# Patient Record
Sex: Female | Born: 1980
Health system: Southern US, Community
[De-identification: ages and names within clinical notes are randomized; demographics above are authoritative.]

## PROBLEM LIST (undated history)

## (undated) DIAGNOSIS — N809 Endometriosis, unspecified: Secondary | ICD-10-CM

## (undated) DIAGNOSIS — I509 Heart failure, unspecified: Secondary | ICD-10-CM

## (undated) DIAGNOSIS — R600 Localized edema: Secondary | ICD-10-CM

## (undated) DIAGNOSIS — R011 Cardiac murmur, unspecified: Secondary | ICD-10-CM

## (undated) DIAGNOSIS — N879 Dysplasia of cervix uteri, unspecified: Secondary | ICD-10-CM

## (undated) DIAGNOSIS — M549 Dorsalgia, unspecified: Secondary | ICD-10-CM

## (undated) DIAGNOSIS — D649 Anemia, unspecified: Secondary | ICD-10-CM

## (undated) DIAGNOSIS — R51 Headache: Secondary | ICD-10-CM

## (undated) DIAGNOSIS — R85619 Unspecified abnormal cytological findings in specimens from anus: Secondary | ICD-10-CM

## (undated) DIAGNOSIS — K829 Disease of gallbladder, unspecified: Secondary | ICD-10-CM

## (undated) DIAGNOSIS — R519 Headache, unspecified: Secondary | ICD-10-CM

## (undated) HISTORY — DX: Headache, unspecified: R51.9

## (undated) HISTORY — DX: Anemia, unspecified: D64.9

## (undated) HISTORY — DX: Headache: R51

## (undated) HISTORY — DX: Dorsalgia, unspecified: M54.9

## (undated) HISTORY — DX: Unspecified abnormal cytological findings in specimens from anus: R85.619

## (undated) HISTORY — DX: Dysplasia of cervix uteri, unspecified: N87.9

## (undated) HISTORY — DX: Cardiac murmur, unspecified: R01.1

## (undated) HISTORY — DX: Disease of gallbladder, unspecified: K82.9

## (undated) HISTORY — DX: Endometriosis, unspecified: N80.9

## (undated) HISTORY — PX: CORONARY ARTERY BYPASS GRAFT: SHX141

## (undated) HISTORY — DX: Localized edema: R60.0

## (undated) HISTORY — PX: COLPOSCOPY: SHX161

## (undated) HISTORY — PX: ABDOMINAL HYSTERECTOMY: SHX81

---

## 2001-11-03 HISTORY — PX: CHOLECYSTECTOMY: SHX55

## 2002-09-14 DIAGNOSIS — O41 Oligohydramnios: Secondary | ICD-10-CM

## 2004-12-10 ENCOUNTER — Emergency Department: Payer: Self-pay | Admitting: Emergency Medicine

## 2005-11-03 HISTORY — PX: CERVICAL BIOPSY  W/ LOOP ELECTRODE EXCISION: SUR135

## 2005-11-13 ENCOUNTER — Ambulatory Visit: Payer: Self-pay | Admitting: Unknown Physician Specialty

## 2005-11-17 ENCOUNTER — Emergency Department: Payer: Self-pay | Admitting: Emergency Medicine

## 2006-11-30 ENCOUNTER — Emergency Department: Payer: Self-pay | Admitting: Emergency Medicine

## 2006-11-30 ENCOUNTER — Other Ambulatory Visit: Payer: Self-pay

## 2007-05-20 ENCOUNTER — Ambulatory Visit: Payer: Self-pay | Admitting: Unknown Physician Specialty

## 2007-07-22 ENCOUNTER — Observation Stay: Payer: Self-pay | Admitting: Obstetrics and Gynecology

## 2007-08-05 ENCOUNTER — Ambulatory Visit: Payer: Self-pay

## 2007-08-08 ENCOUNTER — Observation Stay: Payer: Self-pay | Admitting: Obstetrics & Gynecology

## 2007-08-09 ENCOUNTER — Encounter: Payer: Self-pay | Admitting: Maternal & Fetal Medicine

## 2007-08-26 ENCOUNTER — Observation Stay: Payer: Self-pay | Admitting: Obstetrics & Gynecology

## 2007-08-27 ENCOUNTER — Observation Stay: Payer: Self-pay

## 2007-09-09 ENCOUNTER — Observation Stay: Payer: Self-pay | Admitting: Obstetrics and Gynecology

## 2007-09-29 ENCOUNTER — Observation Stay: Payer: Self-pay

## 2007-10-02 ENCOUNTER — Observation Stay: Payer: Self-pay

## 2007-10-06 ENCOUNTER — Inpatient Hospital Stay: Payer: Self-pay | Admitting: Obstetrics and Gynecology

## 2007-10-07 DIAGNOSIS — O41 Oligohydramnios: Secondary | ICD-10-CM

## 2008-09-27 ENCOUNTER — Emergency Department: Payer: Self-pay | Admitting: Emergency Medicine

## 2008-11-12 ENCOUNTER — Emergency Department: Payer: Self-pay | Admitting: Emergency Medicine

## 2009-03-26 ENCOUNTER — Emergency Department: Payer: Self-pay

## 2009-05-15 ENCOUNTER — Emergency Department: Payer: Self-pay | Admitting: Emergency Medicine

## 2009-06-03 ENCOUNTER — Emergency Department: Payer: Self-pay | Admitting: Emergency Medicine

## 2010-05-10 ENCOUNTER — Emergency Department: Payer: Self-pay | Admitting: Emergency Medicine

## 2010-05-11 ENCOUNTER — Emergency Department: Payer: Self-pay | Admitting: Emergency Medicine

## 2010-10-25 ENCOUNTER — Emergency Department: Payer: Self-pay | Admitting: Emergency Medicine

## 2012-10-22 ENCOUNTER — Ambulatory Visit: Payer: Self-pay | Admitting: General Practice

## 2012-11-03 HISTORY — PX: COMBINED HYSTEROSCOPY DIAGNOSTIC / D&C: SUR297

## 2013-01-14 ENCOUNTER — Observation Stay: Payer: Self-pay | Admitting: Obstetrics and Gynecology

## 2013-01-14 LAB — COMPREHENSIVE METABOLIC PANEL
Chloride: 107 mmol/L (ref 98–107)
Co2: 23 mmol/L (ref 21–32)
Creatinine: 0.9 mg/dL (ref 0.60–1.30)
EGFR (African American): >60
EGFR (Non-African Amer.): >60
Glucose: 105 mg/dL — ABNORMAL HIGH (ref 65–99)
Osmolality: 277 (ref 275–301)
SGOT(AST): 15 U/L (ref 15–37)

## 2013-01-14 LAB — CBC WITH DIFFERENTIAL/PLATELET
Basophil #: 0.1 10*3/uL (ref 0.0–0.1)
Basophil %: 0.8 %
Eosinophil #: 0.1 10*3/uL (ref 0.0–0.7)
Eosinophil %: 1.3 %
HCT: 36.8 % (ref 35.0–47.0)
Lymphocyte %: 32.6 %
MCV: 89 fL (ref 80–100)
Monocyte %: 7.6 %
Neutrophil #: 5.8 10*3/uL (ref 1.4–6.5)
Platelet: 396 10*3/uL (ref 150–440)
RDW: 12.6 % (ref 11.5–14.5)

## 2013-01-14 LAB — URINALYSIS, COMPLETE
Bacteria: NONE SEEN
Bilirubin,UR: NEGATIVE
Glucose,UR: NEGATIVE mg/dL (ref 0–75)
Ketone: NEGATIVE
Leukocyte Esterase: NEGATIVE
Nitrite: NEGATIVE
Protein: NEGATIVE
RBC,UR: 6 /HPF (ref 0–5)
Specific Gravity: 1.031 (ref 1.003–1.030)
Squamous Epithelial: 2

## 2013-01-17 LAB — PATHOLOGY REPORT

## 2013-08-02 ENCOUNTER — Ambulatory Visit: Payer: Self-pay

## 2013-08-03 ENCOUNTER — Ambulatory Visit: Payer: Self-pay

## 2013-08-24 ENCOUNTER — Observation Stay: Payer: Self-pay

## 2013-08-24 LAB — URINALYSIS, COMPLETE
Blood: NEGATIVE
Ph: 9 (ref 4.5–8.0)
Protein: NEGATIVE
Specific Gravity: 1.006 (ref 1.003–1.030)
WBC UR: 2 /HPF (ref 0–5)

## 2013-08-25 LAB — FETAL FIBRONECTIN: Fetal Fibronectin: NEGATIVE

## 2013-08-26 LAB — URINE CULTURE

## 2013-09-03 ENCOUNTER — Ambulatory Visit: Payer: Self-pay

## 2013-09-15 ENCOUNTER — Emergency Department: Payer: Self-pay | Admitting: Emergency Medicine

## 2013-09-17 LAB — BETA STREP CULTURE(ARMC)

## 2013-09-24 ENCOUNTER — Observation Stay: Payer: Self-pay | Admitting: Obstetrics and Gynecology

## 2013-09-25 LAB — URINALYSIS, COMPLETE
Blood: NEGATIVE
Glucose,UR: NEGATIVE mg/dL (ref 0–75)
Nitrite: NEGATIVE
Ph: 7 (ref 4.5–8.0)
Protein: NEGATIVE
RBC,UR: 2 /HPF (ref 0–5)
Specific Gravity: 1.011 (ref 1.003–1.030)
WBC UR: 3 /HPF (ref 0–5)

## 2013-09-25 LAB — FETAL FIBRONECTIN
Appearance: NORMAL
Fetal Fibronectin: NEGATIVE

## 2013-10-20 ENCOUNTER — Observation Stay: Payer: Self-pay | Admitting: Obstetrics and Gynecology

## 2013-10-22 ENCOUNTER — Observation Stay: Payer: Self-pay

## 2013-10-25 ENCOUNTER — Observation Stay: Payer: Self-pay

## 2013-10-29 ENCOUNTER — Observation Stay: Payer: Self-pay | Admitting: Obstetrics and Gynecology

## 2013-11-09 ENCOUNTER — Ambulatory Visit: Payer: Self-pay | Admitting: Obstetrics and Gynecology

## 2013-11-09 LAB — CBC WITH DIFFERENTIAL/PLATELET
BASOS PCT: 0.2 %
Basophil #: 0 10*3/uL (ref 0.0–0.1)
EOS PCT: 0.5 %
Eosinophil #: 0 10*3/uL (ref 0.0–0.7)
HCT: 31.2 % — ABNORMAL LOW (ref 35.0–47.0)
HGB: 10.7 g/dL — ABNORMAL LOW (ref 12.0–16.0)
Lymphocyte #: 1.9 10*3/uL (ref 1.0–3.6)
Lymphocyte %: 24 %
MCH: 29.3 pg (ref 26.0–34.0)
MCHC: 34.2 g/dL (ref 32.0–36.0)
MCV: 86 fL (ref 80–100)
MONO ABS: 0.6 x10 3/mm (ref 0.2–0.9)
MONOS PCT: 7.9 %
NEUTROS ABS: 5.4 10*3/uL (ref 1.4–6.5)
NEUTROS PCT: 67.4 %
Platelet: 312 10*3/uL (ref 150–440)
RBC: 3.65 10*6/uL — ABNORMAL LOW (ref 3.80–5.20)
RDW: 14 % (ref 11.5–14.5)
WBC: 8 10*3/uL (ref 3.6–11.0)

## 2013-11-10 ENCOUNTER — Inpatient Hospital Stay: Payer: Self-pay

## 2013-11-10 DIAGNOSIS — N883 Incompetence of cervix uteri: Secondary | ICD-10-CM

## 2013-11-11 LAB — HEMATOCRIT: HCT: 26.6 % — ABNORMAL LOW (ref 35.0–47.0)

## 2013-11-11 LAB — PATHOLOGY REPORT

## 2015-01-26 LAB — HM PAP SMEAR

## 2015-02-23 NOTE — Op Note (Signed)
PATIENT NAME:  Stacey Taylor, Stacey Taylor MR#:  [id number] DATE OF BIRTH:  02-13-1981  DATE OF PROCEDURE:  01/14/2013  PREOPERATIVE DIAGNOSES:  1.  Pelvic pain.  2.  6 cm uterine versus cervical mass. 3.  History of LEEP. 4.  No cycle in over 6 years, second Nexplanon removed in December.  POSTOPERATIVE DIAGNOSES:  1.  Pelvic pain.  2.  6 cm uterine versus cervical mass. 3.  History of LEEP. 4.  No cycle in over 6 years, second Nexplanon removed in December. 5.  Hematometra.  PROCEDURES:   1.  Exam under anesthesia.  2.  Evacuation of hematometra.  SURGEON: Franchot Erichsen, MD  SPECIMENS: Endometrial curettings.   ESTIMATED BLOOD LOSS: Approximately 75 mL of blood contained within the endometrial cavity, dark burgundy colored. No evidence of infection. Bleeding from procedure approximately 20 mL.  SPECIMENS: Endometrial curettings.   DRAINS: In and out cath with a red rubber at the end of the case.  COMPLICATIONS:  None.  ANESTHESIA: General.   PROCEDURE IN DETAIL: The patient was admitted early morning with pain. Ultrasound showed a 6 cm mass that appeared to be homogeneous concerning for malignancy; in comparison to ultrasound in December which was normal.   Given her history of suspected hematometra, I explained to the patient we will do exam under anesthesia, possible D and C, possible biopsy should a mass prove to be evident. She stated understanding and consent was signed. She was taken to the operating room and placed in the supine position, anesthesia was initiated, and then 2 grams of Ancef was given IV.   She was prepped and draped in the usual sterile fashion after being positioned in dorsal lithotomy position using Allen stirrups. The cervix was visualized and there was whitish covering of the cervix with a bluish hue at the center, again consistent with hematometra. A single-tooth tenaculum was placed. Bimanual exam showed what appeared to be a soft cervix with fluctuance. A #18  needle was used, along with a Claiborne Billings, to make a puncture incision centrally in the cervix with immediate spillage of old dark blood. This incision was extended with the needle and also with a uterine sound with drainage of significant amount of fluid, as noted above. Uterine curettage was then carried out with rough and sharpened curette. This tissue was sent for path. Fluid was discarded.  Biopsy forceps were then used to create a Y-shaped incision, at the cervical os, at 2, 10 and 7.  These edges were made hemostatic with cautery and bimanual exam showed no evidence of remaining mass.   Instruments were removed and cautery was required to control oozing from the [place] tenaculum puncture wounds. Areas were seen to be hemostatic. The patient was returned to the supine position after draining the bladder (about 100 mL of clear urine) and she will be sent to recovery and plan discharge home from there.  The patient tolerated the procedure well. All instrument, needle and sponge counts were correct. I will see her back in the office in 2 weeks and probably should consider opening the cervix monthly for the first 2 or 3 months to prevent it from closing over again.  ____________________________ Stacey Pili L. Amalia Hailey, MD rle:sb D: 01/14/2013 11:30:05 ET    T: 01/14/2013 13:59:55 ET        JOB#: [id number] cc: Ricky L. Amalia Hailey, MD, <Dictator> Selmer Dominion MD ELECTRONICALLY SIGNED 01/14/2013 17:57

## 2015-02-24 NOTE — Op Note (Signed)
PATIENT NAME:  Stacey Taylor, Stacey Taylor MR#:  [id number] DATE OF BIRTH:  10-Jun-1981  DATE OF PROCEDURE:  11/10/2013  PREOPERATIVE DIAGNOSES: 1.  Term intrauterine pregnancy at 39 weeks, two days gestation.  2.  History of prior low transverse c section.  3.  Desires permanent surgical sterilization.  4.  Gestational diabetes.   POSTOPERATIVE DIAGNOSES:  1.  Term intrauterine pregnancy at 39 weeks, two days gestation.  2.  History of prior low transverse c section.  3.  Desires permanent surgical sterilization.  4.  Gestational diabetes.   OPERATION PERFORMED:  Repeat low transverse cesarean section via Pfannenstiel skin incision, bilateral partial salpingectomy via Pomeroy method.  ANESTHESIA:  Spinal.   PRIMARY SURGEON:  Stoney Bang. Georgianne Fick, MD  ASSISTANT:  Erik Obey, M.D.   ESTIMATED BLOOD LOSS: 800 mL   OPERATIVE FLUIDS: 1500 mL of crystalloid.   URINE OUTPUT: 150 mL of clear urine.   COMPLICATIONS: None.   INTRAOPERATIVE FINDINGS: Normal tubes, ovaries, and uterus. There were omental adhesions which were encountered during the course of the procedure upon entering the peritoneum. Otherwise, the peritoneum and intra-abdominal cavity were free of adhesions. Delivery resulted in the birth of a liveborn female infant weighing 3200 grams, 7 pounds 1 ounce, Apgars 9 and 9.   PREOPERATIVE ANTIBIOTICS: 2 grams of Ancef.   DRAINS OR TUBES: Foley to gravity drainage, On-Q catheter system.   IMPLANTS: None.   SPECIMENS REMOVED: Portion of right and left tube.   THE PATIENT CONDITION FOLLOWING PROCEDURE: Stable.   PROCEDURE IN DETAIL: Risks, benefits, and alternatives of the procedure were discussed with the patient prior to proceeding to the Operating Room. In addition, the permanent nature of permanent surgical sterilization was discussed with the patient and she voiced her understanding. The patient was taken back to the Operating Room where she was administered spinal anesthesia. She  was positioned in the supine position and prepped and draped in the usual sterile fashion. A timeout was performed. Attention was then turned to the patient's abdomen. Pre-existing Pfannenstiel skin incision was utilized and carried down sharply to the level of the rectus fascia using the scalpel. The fascia was incised in the midline using the scalpel. The fascial incision was extended using Mayo scissors. The superior border of the rectus fascia was grasped with two Kocher clamps. The underlying rectus muscles were dissected off the fascia bluntly. This was then repeated for the inferior border of the rectus fascia in a similar fashion. The peritoneum had been entered during resection of the median raphe. The peritoneal incision was grasped and extended using manual traction. Several adhesions of omentum to the anterior abdominal wall and peritoneum were noted. These were clamped using a Kelly clamp before being resected and the suture ligated using a 0 Vicryl. An Alexis retractor was then placed to allow visualization of the lower uterine segment. The previous hysterotomy incision was inspected and utilized to make the new hysterotomy incision using the knife. The uterus was entered bluntly using the operator's finger and the hysterotomy incision extended using manual traction. Amniotomy revealed clear fluid. The infant's vertex was grasped, flexed, brought to the incision, delivered atraumatically using fundal pressure. The remainder of the body delivered with ease. The infant was suctioned, cord was clamped and cut and the infant was passed to the awaiting pediatricians. Cord blood was obtained. The placenta was delivered using manual extraction. The uterus was then exteriorized, wiped clean of clots and debris and closed in a single layer closure of 0  Vicryl running locked. Attention was then turned to the patient's right tube, which was grasped in the mid-isthmic portion using Babcock clamp and doubly suture  ligated using 0 chromic wheel. The intervening knuckle of tube was then excised, tubal ostia were visualized and tubal segments were noted to be hemostatic. This was then repeated on the patient's left tube. Following this the uterus was returned to the abdomen. The hysterotomy incision was reinspected. There was some oozing on the left aspect of the hysterotomy incision, which was oversewn using a figure-of-eight of 0 chromic. The inferior edge of the hysterotomy incision was then sewn to the superior edge to repair the uterine serosa using a 2-0 Vicryl. Following this, the On-Q catheters were placed subfascially per the usual protocol. The fascia was then closed using a looped #1 PDS in a running fashion. The subcutaneous tissue was irrigated and hemostasis achieved using the Bovie. The subcutaneous dead space was closed using a T53 plain gut suture. Following this, the skin was closed using staples. Each On-Q catheter was bolused with 5 mL of 0.5% bupivacaine. Sponge, needle, and instrument counts were correct x 2. The patient tolerated the procedure well and was taken to the recovery room in stable condition    ____________________________ Stoney Bang. Georgianne Fick, MD ams:cc D: 11/10/2013 18:30:32 ET T: 11/11/2013 00:05:10 ET JOB#: [id number]  cc: Stoney Bang. Georgianne Fick, MD, <Dictator> Conan Bowens Madelon Lips MD ELECTRONICALLY SIGNED 11/24/2013 8:54

## 2015-03-13 NOTE — H&P (Signed)
L&D Evaluation:  History Expanded:  HPI 34 yo [id number] who has had two preterm deliveries, a term delivery at 79 and one csection after LEEP and cervical stenosis-for which she eventually had a d and c and dilation of the cervix. She would like to have a vbac this pregnancy and is scheduled for csection at around 40 weeks to let her do so. she comes in tonight,sent from the office for low AFi at 36 weeks,  GBS pos urine, resolved lowlying placenta. desires PPBTL and has signed papers, got tdap on 11/17. has gestational diabetes this pregnancy andis seen in [facility],she is on 17 P shots this pregnancy and is done,   [place] 5   Term 2   PreTerm 2   Abortion 0   Living 2   Blood Type (Maternal) A positive   Group B Strep Results Maternal (Result >5wks must be treated as unknown) positive   Maternal HIV Negative   Maternal Syphilis Ab Nonreactive   Maternal Varicella Immune   Rubella Results (Maternal) immune   Maternal T-Dap Immune   [facility] 15-Nov-2013   Presents with low afi in the office   Patient's Medical History No Chronic Illness   Patient's Surgical History D&C  LEEP  Previous C-Section   Medications Pre Natal Vitamins   Allergies NKDA   Social History none   Family History Non-Contributory   Current Prenatal Course Notable For Low Lying Placenta  PreTerm Labor  resolved and obesity   ROS:  ROS All systems were reviewed.  HEENT, CNS, GI, GU, Respiratory, CV, Renal and Musculoskeletal systems were found to be normal.   Exam:  Vital Signs stable   Urine Protein negative dipstick   General no apparent distress   Mental Status clear   Chest clear   Heart normal sinus rhythm   Abdomen gravid, non-tender   Back no CVAT   Edema no edema   Reflexes 1+   Clonus positive   Pelvic no external lesions, cervix closed and thick   Mebranes Intact   FHT normal rate with no decels   FHT Description cat 1   Fetal Heart Rate 140   Ucx absent    Skin dry   Lymph no lymphadenopathy   Impression:  Impression dehydration   Plan:  Comments will do AFi and NST and watch she would like to VBAC and is released to labor if she continues on this pregnancy.   Follow Up Appointment need to schedule. AFi and NST next week in the office   Electronic Signatures: Erik Obey (MD)  (Signed 18-Dec-14 22:17)  Authored: L&D Evaluation   Last Updated: 18-Dec-14 22:17 by Erik Obey (MD)

## 2015-03-13 NOTE — H&P (Signed)
L&D Evaluation:  History:  HPI 34 yo [id number] EDD of 11/15/13 per 6 wk [place], presents at 37wk0d from the office after AFI =5.3 and the SDP=1.99cm. PNC at [facility] notable for early entry to care, BMI >40, A1GDM, 17 P injections. History notable for two preterm deliveries, a term delivery at 32 and one csection after LEEP and cervical stenosis. Pt desired VBAC earlier in pregnancy, repeat scheduled for 1/8. Pt was evaluated on 12/18 for low AFI at office, after IV hydration AFI was 10 and pt was d/c home. On 12/20 she was seen in L&D for LOF/ctxs. There was no SROM and the AFI was then 8 cm. GBS pos urine, resolved lowlying placenta. Desires PPBTL and has signed papers, got tdap on 11/17.   Presents with AFI=5.3 in office   Patient's Medical History No Chronic Illness   Patient's Surgical History D&C  LEEP  Previous C-Section   Medications Robitussin and Tylenol for a cold   Allergies NKDA   Social History none   Family History Non-Contributory   ROS:  ROS positive for cough and nasal congestion   Exam:  Vital Signs stable   Urine Protein not completed   General no apparent distress   Mental Status clear   Chest clear   Heart normal sinus rhythm, no murmur/gallop/rubs   Abdomen gravid, non-tender   Estimated Fetal Weight Average for gestational age   Fetal Position vtx   Edema no edema   Reflexes 2+   Mebranes Intact   FHT normal rate with no decels, 140 baseline with accels to 180   FHT Description mod variability   Ucx irregular, occ   Skin dry   Impression:  Impression IUP at 37 weeks with borderline AFI. Reactive NST. Hx of prior C-section   Plan:  Plan EFM/NST, After consultation with Dr Glennon Mac: will repeat  a formal AFI and if <5 cm will do a repeat CS tonight, if borderline will hydrate and repeat AFI in AM and if normal will discharge home.   Electronic Signatures for Addendum Section:  Stacey Taylor (CNM) (Signed Addendum 23-Dec-14 21:40)  AFI=5.6cm. Will IV and po hydrate and recheck AFI in the AM.   Electronic Signatures: Stacey Taylor (CNM)  (Signed 23-Dec-14 21:39)  Authored: L&D Evaluation   Last Updated: 23-Dec-14 21:40 by Stacey Taylor (CNM)

## 2015-03-13 NOTE — H&P (Signed)
L&D Evaluation:  History Expanded:  HPI 34 yo [id number] who has had two preterm deliveries, a term delivery at 37 and one csection after LEEP and cervical stenosis-for which she eventually had a d and c and dilation of the cervix. She would like to have a vbac this pregnancy and is scheduled for csection at around 40 weeks to let her do so. she comes in tonight, that she is having some cramping and pain and wants to be sure she is not contracting. GBS pos urine, resolved lowlying placenta. desires PPBTL and has signed papers, got tdap on 11/17. has gestational diabetes this pregnancy andis seen in [facility],sheis on 17 P shots this pregnancy   Gravida 5   Term 2   PreTerm 2   Abortion 0   Living 2   Blood Type (Maternal) A positive   Group B Strep Results Maternal (Result >5wks must be treated as unknown) positive   Maternal HIV Negative   Maternal Syphilis Ab Nonreactive   Maternal Varicella Immune   Rubella Results (Maternal) immune   Maternal T-Dap Immune   [facility] 15-Nov-2013   Presents with back pain   Patient's Medical History No Chronic Illness   Patient's Surgical History D&C  LEEP   Medications Pre [place] Vitamins  17P   Allergies NKDA   Social History none   Family History Non-Contributory   Current Prenatal Course Notable For Low Lying Placenta  PreTerm Labor   ROS:  ROS All systems were reviewed.  HEENT, CNS, GI, GU, Respiratory, CV, Renal and Musculoskeletal systems were found to be normal.   Exam:  Vital Signs stable   Urine Protein negative dipstick   General no apparent distress   Mental Status clear   Chest clear   Heart normal sinus rhythm   Abdomen gravid, non-tender   Back no CVAT   Edema no edema   Reflexes 1+   Clonus positive   Pelvic no external lesions   Mebranes Intact   FHT normal rate with no decels   FHT Description cat 1   Fetal Heart Rate 140   Ucx absent   Skin dry   Lymph no lymphadenopathy   Other  cvc closed   Impression:  Impression UTI, early preterm ctx no dilation   Plan:  Plan UA, monitor contractions and for cervical change   Comments will check ffn and labs and give one dose of terb to calm down uterus to see if this isthe pain she is having. Would watch carefully for accreta at the time of delivery given D and C and low lying placenta with previous csection, placenta is posterior. so no concerns about growth into scar but has had d and c with [facility]. will allow to VBAC and do csection at 40 weeks,   Follow Up Appointment already scheduled. monday   Electronic Signatures: Erik Obey (MD)  (Signed [phone] 04:02)  Authored: L&D Evaluation   Last Updated: 23-Nov-14 04:02 by Erik Obey (MD)

## 2015-03-13 NOTE — H&P (Signed)
L&D Evaluation:  History:  HPI Pt is a 34 yo [id number] at 28.[redacted] weeks GA presents for compliants of lower abdominal pain. Pt reports that she has had abdominal pain all day long but it has gotten more intense over the last couple of hours. She denies urinary burning or urgency. She reports that she "always feels like I am leaking urine". She reports +FM, and denies VB. Pt has a history of 2 preterm deliveries at 15 and 34 weeks. However her last pregnancy was delivered by c-section due to cervical stenosis secondary to a leep procedure. Her prenatal course has been complicated by GBS UTI, GDM, yeast infection, lower abdominal pain, and 17-P injections for PTL prevention. She is A+, RI, VI. Pt desires VBAC and a BTL postpartum.   Presents with abdominal pain   Patient's Medical History GDM,HGSIL pap, obesity, anemia   Patient's Surgical History LEEP  Previous C-Section  cholecystectomy, D&C, colposcopy   Medications Pre [place] Vitamins  other  folic acid   Allergies other, latex   Social History none   Family History Non-Contributory   ROS:  GU reports leaking urine   Exam:  Vital Signs stable   General no apparent distress   Mental Status clear   Chest clear   Heart normal sinus rhythm   Abdomen gravid, non-tender   Estimated Fetal Weight Average for gestational age   Back no CVAT   Pelvic no external lesions, pt did not tolerate cervical exam- cervix posterior, fetus out of pelvis   Mebranes Intact   FHT normal rate with no decels, 130's, +accels   Fetal Heart Rate 135   Ucx irregular, infrequent, mild   Skin dry, no lesions, no rashes   Lymph no lymphadenopathy   Other +yeast buds and pseudohyphae seen on wet prep -whiff -clue cells   Impression:  Impression reactive NST, yeast infection   Plan:  Plan EFM/NST, monitor contractions and for cervical change, treat yeast infection, await FFN results   Follow Up Appointment need to schedule   Electronic  Signatures: Louisa Second (CNM)  (Signed 23-Oct-14 00:29)  Authored: L&D Evaluation   Last Updated: 23-Oct-14 00:29 by Louisa Second (CNM)

## 2015-03-13 NOTE — H&P (Signed)
L&D Evaluation:  History Expanded:  HPI 34 yo [id number] EDD of 11/15/13 per 6 wk [place], presents at [redacted]w[redacted]d with c/o leaking fluid yesterday. Reports irregular contractions recently. No LOF or VB. PNC at [facility] notable for early entry to care, BMI >40, A1GDM, 17 P injections. History notable for two preterm deliveries, a term delivery at 69 and one csection after LEEP and cervical stenosis. Pt desired VBAC earlier in pregnancy, repeat scheduled for 1/8. Pt was evaluated on 12/18 for low AFI at office, after IV hydration AFI was 10 and pt was d/c home. GBS pos urine, resolved lowlying placenta. Desires PPBTL and has signed papers, got tdap on 11/17.   Grav[place] 5   Term 2   PreTerm 2   Abortion 0   Living 2   Blood Type (Maternal) A positive   Group B Strep Results Maternal (Result >5wks must be treated as unknown) positive  (urine)   Maternal HIV Negative   Maternal Syphilis Ab Nonreactive   Maternal Varicella Immune   Rubella Results (Maternal) immune   Maternal T-Dap Immune   [facility] 15-Nov-2013   Presents with leaking   Patient's Medical History No Chronic Illness   Patient's Surgical History D&C  LEEP  Previous C-Section   Medications Pre Natal Vitamins   Allergies NKDA   Social History none   Family History Non-Contributory   Current Prenatal Course Notable For Low Lying Placenta  PreTerm Labor  resolved and obesity   ROS:  ROS All systems were reviewed.  HEENT, CNS, GI, GU, Respiratory, CV, Renal and Musculoskeletal systems were found to be normal.   Exam:  Vital Signs stable   Urine Protein negative dipstick   General no apparent distress   Mental Status clear   Chest clear   Heart normal sinus rhythm   Abdomen gravid, non-tender   Back no CVAT   Edema no edema   Reflexes 1+   Clonus positive   Pelvic no external lesions, closed/80/-2   Mebranes Intact, SSE: fern, nitrizine and pooling negative, wet mount  + Clue and Whiff   FHT normal rate with  no decels   FHT Description cat 1   Fetal Heart Rate 140   Ucx irregular   Skin dry   Lymph no lymphadenopathy   Impression:  Impression IUP at [redacted]w[redacted]d, Bacterial Vaginosis   Plan:  Plan discharge   Comments AFI per [place] dept:8 cm Labor precautions  Rx for Flagyl   Follow Up Appointment already scheduled. [facility] appt scheduled on 12/22 - will need AFI repeated   Electronic Signatures: Ander Purpura (CNM)  (Signed 20-Dec-14 22:43)  Authored: L&D Evaluation   Last Updated: 20-Dec-14 22:43 by Ander Purpura (CNM)

## 2015-03-13 NOTE — H&P (Signed)
L&D Evaluation:  History Expanded:  HPI 34 yo [id number] EDD of 11/15/13 per 6 wk [place], presents at [redacted]w[redacted]d she uis here for an NST and an AFI.  PNC at [facility] notable for early entry to care, BMI >40, A1GDM, 17 P injections. History notable for two preterm deliveries, a term delivery at 89 and one csection after LEEP and cervical stenosis. Pt desired VBAC earlier in pregnancy, repeat c/s scheduled for 1/8. Pt was evaluated on 12/18 for low AFI at office, after IV hydration AFI was 10 and pt was d/c home. On 12/20 she was seen in L&D for LOF/ctxs. There was no SROM and the AFI was then 8 cm. GBS pos urine, resolved lowlying placenta. Desires PPBTL and has signed papers, got tdap on 11/17.   Gravida 5   Term 2   PreTerm 2   Abortion 0   Living 4   Blood Type (Maternal) A positive   Group B Strep Results Maternal (Result >5wks must be treated as unknown) positive  urine   Maternal HIV Negative   Maternal Syphilis Ab Nonreactive   Maternal Varicella Immune   Rubella Results (Maternal) immune   Maternal T-Dap Immune   [facility] 15-Nov-2013   Presents with obesirty anf following for AFI borderline low.   Patient's Medical History No Chronic Illness   Patient's Surgical History D&C  LEEP  Previous C-Section   Medications Robitussin and Tylenol for a cold   Allergies NKDA   Social History none   Family History Non-Contributory   Current Prenatal Course Notable For Oligohydramnios   ROS:  ROS positive for cough and nasal congestion   Exam:  Vital Signs stable   Urine Protein not completed   General no apparent distress   Mental Status clear   Chest clear   Heart normal sinus rhythm, no murmur/gallop/rubs   Abdomen gravid, non-tender   Estimated Fetal Weight Average for gestational age   Fetal Position vtx   Edema no edema   Reflexes 2+   Mebranes Intact   FHT normal rate with no decels, 140 baseline with accels to 180   FHT Description mod variability   Ucx  irregular, occ   Skin dry   Impression:  Impression IUP at 37 weeks with borderline AFI. Reactive NST. Hx of prior C-section   Plan:  Plan EFM/NST   Comments afi and nst normal afi 5.9 per radiology. nl for patient whoi hydrates well with IVF increasing her AFI. she does not drink much per her BF. will encourage drinking and see next week for AFI and NST in the office.   Follow Up Appointment need to schedule. 3 days   Electronic Signatures: Erik Obey (MD)  (Signed 27-Dec-14 14:02)  Authored: L&D Evaluation   Last Updated: 27-Dec-14 14:02 by Erik Obey (MD)

## 2015-10-01 ENCOUNTER — Encounter: Payer: Self-pay | Admitting: Physician Assistant

## 2015-10-01 ENCOUNTER — Ambulatory Visit: Payer: Self-pay | Admitting: Physician Assistant

## 2015-10-01 VITALS — BP 132/90 | HR 80 | Temp 98.3°F

## 2015-10-01 DIAGNOSIS — J018 Other acute sinusitis: Secondary | ICD-10-CM

## 2015-10-01 MED ORDER — AMOXICILLIN 875 MG PO TABS: 875.0000 mg | ORAL_TABLET | Freq: Two times a day (BID) | ORAL | Status: DC

## 2015-10-01 NOTE — Progress Notes (Signed)
S: C/o runny nose and congestion for 3 days, no fever, chills, cp/sob, v/d; mucus is green and thick, cough is sporadic, c/o of facial and dental pain.   Using otc meds:   O: PE: perrl eomi, normocephalic, tms dull, nasal mucosa red and swollen, throat injected, neck supple no lymph, lungs c t a, cv rrr, neuro intact  A:  Acute sinusitis   P: amoxil 875mg  bid x 10d; continue flonase;  drink fluids, continue regular meds , use otc meds of choice, return if not improving in 5 days, return earlier if worsening

## 2015-10-11 ENCOUNTER — Emergency Department: Payer: No Typology Code available for payment source

## 2015-10-11 ENCOUNTER — Emergency Department
Admission: EM | Admit: 2015-10-11 | Discharge: 2015-10-11 | Disposition: A | Discharge status: Home / Self Care | Payer: No Typology Code available for payment source | Attending: Emergency Medicine | Admitting: Emergency Medicine

## 2015-10-11 ENCOUNTER — Encounter: Payer: Self-pay | Admitting: *Deleted

## 2015-10-11 DIAGNOSIS — S29012A Strain of muscle and tendon of back wall of thorax, initial encounter: Secondary | ICD-10-CM | POA: Insufficient documentation

## 2015-10-11 DIAGNOSIS — Y9389 Activity, other specified: Secondary | ICD-10-CM | POA: Diagnosis not present

## 2015-10-11 DIAGNOSIS — S239XXA Sprain of unspecified parts of thorax, initial encounter: Secondary | ICD-10-CM | POA: Insufficient documentation

## 2015-10-11 DIAGNOSIS — Z792 Long term (current) use of antibiotics: Secondary | ICD-10-CM | POA: Diagnosis not present

## 2015-10-11 DIAGNOSIS — Y998 Other external cause status: Secondary | ICD-10-CM | POA: Insufficient documentation

## 2015-10-11 DIAGNOSIS — S29002A Unspecified injury of muscle and tendon of back wall of thorax, initial encounter: Secondary | ICD-10-CM | POA: Diagnosis present

## 2015-10-11 DIAGNOSIS — Z79899 Other long term (current) drug therapy: Secondary | ICD-10-CM | POA: Insufficient documentation

## 2015-10-11 DIAGNOSIS — Y92410 Unspecified street and highway as the place of occurrence of the external cause: Secondary | ICD-10-CM | POA: Diagnosis not present

## 2015-10-11 DIAGNOSIS — S233XXA Sprain of ligaments of thoracic spine, initial encounter: Secondary | ICD-10-CM

## 2015-10-11 MED ORDER — HYDROCODONE-ACETAMINOPHEN 5-325 MG PO TABS: 1.0000 | ORAL_TABLET | ORAL | Status: DC | PRN

## 2015-10-11 MED ORDER — CYCLOBENZAPRINE HCL 10 MG PO TABS: 10.0000 mg | ORAL_TABLET | Freq: Three times a day (TID) | ORAL | Status: DC | PRN

## 2015-10-11 MED ORDER — IBUPROFEN 800 MG PO TABS: 800.0000 mg | ORAL_TABLET | Freq: Three times a day (TID) | ORAL | Status: DC | PRN
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
## 2015-11-19 ENCOUNTER — Encounter: Payer: Self-pay | Admitting: Physician Assistant

## 2015-11-19 ENCOUNTER — Ambulatory Visit: Payer: Self-pay | Admitting: Physician Assistant

## 2015-11-19 VITALS — BP 160/90 | HR 80 | Temp 98.7°F

## 2015-11-19 DIAGNOSIS — M25572 Pain in left ankle and joints of left foot: Secondary | ICD-10-CM

## 2015-11-19 NOTE — Progress Notes (Signed)
S: c/o pain in back of left ankle, thinks something is wrong with her achilles tendon, stayed off it all weekend but when went to work today pain starting getting worse, used ice/nsaids/elevation, no known injury  O: vitals wnl, nad, left ankle tender at posterior, achilles is intact but ? Tear as has small lump in back of leg, n/v intact, full rom of foot/ankle  A: ?tendon tear at achilles  P: crutches, do not have orthoglass to apply splint, will refer to ortho, pt instructed to not bw if possible on left foot

## 2015-11-20 DIAGNOSIS — M7662 Achilles tendinitis, left leg: Secondary | ICD-10-CM | POA: Diagnosis not present

## 2015-11-20 NOTE — Progress Notes (Signed)
Patient ID: Stacey Taylor, female   DOB: 07/10/1981, 35 y.o.   MRN: [id number] Referral made to [place] w/ Dr. Lucianne Lei on 11/20/2015 @ 2:00p.m. Patient was informed

## 2015-11-23 ENCOUNTER — Ambulatory Visit (INDEPENDENT_AMBULATORY_CARE_PROVIDER_SITE_OTHER): Payer: 59 | Admitting: Family Medicine

## 2015-11-23 ENCOUNTER — Encounter: Payer: Self-pay | Admitting: Family Medicine

## 2015-11-23 VITALS — BP 114/77 | HR 109 | Resp 16 | Ht 60.0 in | Wt 246.0 lb

## 2015-11-23 DIAGNOSIS — Z6841 Body Mass Index (BMI) 40.0 and over, adult: Secondary | ICD-10-CM

## 2015-11-23 DIAGNOSIS — J209 Acute bronchitis, unspecified: Secondary | ICD-10-CM

## 2015-11-23 MED ORDER — PREDNISONE 20 MG PO TABS: 20.0000 mg | ORAL_TABLET | Freq: Two times a day (BID) | ORAL | Status: DC

## 2015-11-23 MED ORDER — DOXYCYCLINE HYCLATE 100 MG PO CAPS: 100.0000 mg | ORAL_CAPSULE | Freq: Two times a day (BID) | ORAL | Status: DC

## 2015-11-23 NOTE — Progress Notes (Signed)
Date:  11/23/2015   Name:  Stacey Taylor   DOB:  10-04-1981   MRN:  [id number]  PCP:  No PCP Per Patient    Chief Complaint: Cough and Wheezing   History of Present Illness:  This is a 35 y.o. female with 3d hx NP cough with wheezing esp at night, sore throat, sl myalgia, fever initially but none now. Alternating Tylenol/Advil, tessalon not helping. OCP only regular rx med.  Review of Systems:  Review of Systems  HENT: Negative for ear pain, rhinorrhea and sinus pressure.   Eyes: Negative for pain.  Cardiovascular: Negative for chest pain and leg swelling.    Patient Active Problem List   Diagnosis Date Noted  . Obesity, Class III, BMI 40-49.9 (morbid obesity) ([place]) 11/23/2015    Prior to Admission medications   Medication Sig Start Date End Date Taking? Authorizing Provider  Benzonatate (TESSALON PO) Take by mouth.   Yes Historical Provider, MD  [facility] 0.25-35 MG-MCG tablet  10/12/15  Yes Historical Provider, MD  doxycycline (VIBRAMYCIN) 100 MG capsule Take 1 capsule (100 mg total) by mouth 2 (two) times daily. 11/23/15   Adline Potter, MD  predniSONE (DELTASONE) 20 MG tablet Take 1 tablet (20 mg total) by mouth 2 (two) times daily with a meal. 11/23/15   Adline Potter, MD    No Known Allergies  Past Surgical History  Procedure Laterality Date  . Cholecystectomy    . Coronary artery bypass graft      Social History  Substance Use Topics  . Smoking status: Never Smoker   . Smokeless tobacco: Never Used  . Alcohol Use: No    Family History  Problem Relation Age of Onset  . Family history unknown: Yes    Medication list has been reviewed and updated.  Physical Examination: BP 114/77 mmHg  Pulse 109  Resp 16  Ht 5' (1.524 m)  Wt 246 lb (111.585 kg)  BMI 48.04 kg/m2  SpO2 98%  LMP 11/22/2015 (Exact Date)  Physical Exam  Constitutional: She appears well-developed and well-nourished.  HENT:  OP erythema no exudate  Neck: Neck supple.  Cardiovascular:  Normal rate, regular rhythm and normal heart sounds.   Pulmonary/Chest: Effort normal and breath sounds normal. She has no wheezes. She has no rales.  Lymphadenopathy:    She has no cervical adenopathy.  Neurological: She is alert.  Skin: Skin is warm and dry.  Psychiatric: She has a normal mood and affect. Her behavior is normal.  Nursing note and vitals reviewed.   Assessment and Plan:  1. Bronchospasm with bronchitis, acute Doxy 100 mg bid x 5d, prednisone 20 mg bid x 5d  2. Obesity, Class III, BMI 40-49.9 (morbid obesity) ([place]) Advised f/u to discuss evaluation/exercise/weight loss  Return if symptoms worsen or fail to improve.  Satira Anis. Burt Ek MD [facility]  11/23/2015

## 2015-11-23 NOTE — Patient Instructions (Signed)

## 2015-11-29 ENCOUNTER — Ambulatory Visit
Admission: RE | Admit: 2015-11-29 | Discharge: 2015-11-29 | Disposition: A | Discharge status: Home / Self Care | Payer: 59 | Source: Ambulatory Visit | Attending: Family Medicine | Admitting: Family Medicine

## 2015-11-29 ENCOUNTER — Ambulatory Visit (INDEPENDENT_AMBULATORY_CARE_PROVIDER_SITE_OTHER): Payer: 59 | Admitting: Family Medicine

## 2015-11-29 ENCOUNTER — Encounter: Payer: Self-pay | Admitting: Family Medicine

## 2015-11-29 VITALS — BP 132/89 | HR 111 | Temp 98.3°F | Resp 16 | Ht 60.0 in | Wt 237.2 lb

## 2015-11-29 DIAGNOSIS — R05 Cough: Secondary | ICD-10-CM

## 2015-11-29 DIAGNOSIS — J069 Acute upper respiratory infection, unspecified: Secondary | ICD-10-CM

## 2015-11-29 DIAGNOSIS — R059 Cough, unspecified: Secondary | ICD-10-CM

## 2015-11-29 MED ORDER — ALBUTEROL SULFATE HFA 108 (90 BASE) MCG/ACT IN AERS: 2.0000 | INHALATION_SPRAY | Freq: Four times a day (QID) | RESPIRATORY_TRACT | Status: DC | PRN

## 2015-11-29 MED ORDER — PSEUDOEPHEDRINE HCL 60 MG PO TABS: 60.0000 mg | ORAL_TABLET | Freq: Three times a day (TID) | ORAL | Status: DC | PRN

## 2015-11-29 MED ORDER — PREDNISONE 10 MG PO TABS: ORAL_TABLET | ORAL | Status: DC

## 2015-11-29 MED ORDER — AZITHROMYCIN 250 MG PO TABS: ORAL_TABLET | ORAL | Status: DC

## 2015-11-29 MED ORDER — PROMETHAZINE-CODEINE 6.25-10 MG/5ML PO SYRP: 5.0000 mL | ORAL_SOLUTION | Freq: Four times a day (QID) | ORAL | Status: DC | PRN

## 2015-11-29 NOTE — Progress Notes (Signed)
Subjective:    Patient ID: Stacey Taylor, female    DOB: 1981/09/04, 35 y.o.   MRN: [id number]  HPI: Stacey Taylor is a 35 y.o. female presenting on 11/29/2015 for Cough   HPI  Pt presents for coughing and wheezing last Tuesday.  Seen on Friday by Dr. Vicente Masson- Given doxycycline- Took prednisone- finished dose on Tuesday evening. Finished doxycycline on Tuesday.  Cough is so bad she is vomiting. Using humifieder and cough suppressants. Lungs hurt- L>R.  Cough is non-productive.  Tiny bit yellow sputum.  Headaches with facial congestion. Mucinex doesn't help.  Treat for sinus infection 3 weeks ago.    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    No past medical history on file. Social History   Social History  . Marital Status: Married    Spouse Name: N/A  . Number of Children: N/A  . Years of Education: N/A   Occupational History  . Not on file.   Social History Main Topics  . Smoking status: Never Smoker   . Smokeless tobacco: Never Used  . Alcohol Use: No  . Drug Use: No  . Sexual Activity: Not on file   Other Topics Concern  . Not on file   Social History Narrative   Family History  Problem Relation Age of Onset  . Adopted: Yes  . Family history unknown: Yes   Current Outpatient Prescriptions on File Prior to Visit  Medication Sig  . MONO-LINYAH 0.25-35 MG-MCG tablet    No current facility-administered medications on file prior to visit.    Review of Systems  Constitutional: Positive for fever.  HENT: Positive for congestion and rhinorrhea.   Respiratory: Positive for cough, chest tightness and wheezing.   Cardiovascular: Negative for chest pain, palpitations and leg swelling.  Gastrointestinal: Negative for nausea, vomiting and diarrhea.  Musculoskeletal: Negative for neck pain and neck stiffness.  Skin: Negative for color change and rash.  Neurological: Positive for headaches.   Per HPI unless specifically indicated above     Objective:      BP 132/89 mmHg  Pulse 111  Temp(Src) 98.3 F (36.8 C) (Oral)  Resp 16  Ht 5' (1.524 m)  Wt 237 lb 3.2 oz (107.593 kg)  BMI 46.32 kg/m2  LMP 11/22/2015 (Exact Date)  Wt Readings from Last 3 Encounters:  11/29/15 237 lb 3.2 oz (107.593 kg)  11/23/15 246 lb (111.585 kg)  10/11/15 228 lb (103.42 kg)    Physical Exam  Constitutional: She is oriented to person, place, and time. She appears well-developed and well-nourished. She appears ill. No distress.  HENT:  Head: Normocephalic and atraumatic.  Right Ear: Hearing and tympanic membrane normal. Tympanic membrane is not erythematous, not retracted and not bulging.  Left Ear: Hearing and tympanic membrane normal. Tympanic membrane is not erythematous, not retracted and not bulging.  Nose: Mucosal edema and rhinorrhea present. Right sinus exhibits no maxillary sinus tenderness and no frontal sinus tenderness. Left sinus exhibits no maxillary sinus tenderness and no frontal sinus tenderness.  Mouth/Throat: Uvula is midline and mucous membranes are normal. Posterior oropharyngeal erythema present.  Neck: Normal range of motion. No erythema present. No Brudzinski's sign and no Kernig's sign noted.  Cardiovascular: Normal rate and regular rhythm.  Exam reveals no gallop and no friction rub.   No murmur heard. Pulmonary/Chest: Effort normal. No respiratory distress. She has no decreased breath sounds. She has wheezes (mild expiratory with coughing. ) in the right upper  field and the left upper field. She has no rhonchi. She has no rales. Chest wall is not dull to percussion. She exhibits no tenderness.  Bronchial breath sounds.    Lymphadenopathy:    She has cervical adenopathy.       Right cervical: Superficial cervical adenopathy present.       Left cervical: Superficial cervical adenopathy present.  Neurological: She is alert and oriented to person, place, and time.  Skin: Skin is warm and dry. No rash noted. She is not diaphoretic. No  erythema. No pallor.  Psychiatric: She has a normal mood and affect. Her speech is normal and behavior is normal.       Assessment & Plan:   Problem List Items Addressed This Visit    None    Visit Diagnoses    Cough    -  Primary    CXR r/o pneumonia. Likely bronchitis. Give Zpak given worsening symptoms. Albuterol PRN, prednisone taper. Supportive care at home. Alarm symptoms reviewed.     Relevant Medications    albuterol (PROVENTIL HFA;VENTOLIN HFA) 108 (90 Base) MCG/ACT inhaler    predniSONE (DELTASONE) 10 MG tablet    promethazine-codeine (PHENERGAN WITH CODEINE) 6.25-10 MG/5ML syrup    azithromycin (ZITHROMAX) 250 MG tablet    Other Relevant Orders    DG Chest 2 View (Completed)    Upper respiratory infection        Supportive care at home. Given sudafed PRN.  Return if symptoms are not improved.     Relevant Medications    azithromycin (ZITHROMAX) 250 MG tablet    pseudoephedrine (SUDAFED) 60 MG tablet       Meds ordered this encounter  Medications  . albuterol (PROVENTIL HFA;VENTOLIN HFA) 108 (90 Base) MCG/ACT inhaler    Sig: Inhale 2 puffs into the lungs every 6 (six) hours as needed for wheezing or shortness of breath.    Dispense:  1 Inhaler    Refill:  0    Order Specific Question:  Supervising Provider    Answer:  Arlis Porta [id number]  . predniSONE (DELTASONE) 10 MG tablet    Sig: 6 pills on day 1, 5 pills day 2, 4 pills day 3, 3 pills day 4, 2 pills day 5, 1 pill day 6.    Dispense:  21 tablet    Refill:  0    Order Specific Question:  Supervising Provider    Answer:  Arlis Porta [phone]  . promethazine-codeine (PHENERGAN WITH CODEINE) 6.25-10 MG/5ML syrup    Sig: Take 5 mLs by mouth every 6 (six) hours as needed for cough.    Dispense:  120 mL    Refill:  0    Order Specific Question:  Supervising Provider    Answer:  Arlis Porta [id number]  . azithromycin (ZITHROMAX) 250 MG tablet    Sig: Dispense as Zpak- Take 2 pills today, 1  pill every day until bottle is empty.    Dispense:  6 tablet    Refill:  0    Order Specific Question:  Supervising Provider    Answer:  Arlis Porta [id number]  . pseudoephedrine (SUDAFED) 60 MG tablet    Sig: Take 1 tablet (60 mg total) by mouth every 8 (eight) hours as needed for congestion.    Dispense:  30 tablet    Refill:  0    Order Specific Question:  Supervising Provider    Answer:  Arlis Porta [phone]  Follow up plan: Return if symptoms worsen or fail to improve.

## 2015-11-29 NOTE — Patient Instructions (Signed)
You can use supportive care at home to help with your symptoms. Use inhaler 2 puffs as needed for chest tightness. You can use strong cough medication at home to help you sleep. Honey is a natural cough suppressant- so add it to your tea in the morning.  If you have a humidifer, set that up in your bedroom at night.   Please seek immediate medical attention if you develop shortness of breath not relieve by inhaler, chest pain/tightness, fever > 103 F or other concerning symptoms.

## 2015-12-25 ENCOUNTER — Ambulatory Visit: Payer: Self-pay | Admitting: Physician Assistant

## 2015-12-25 ENCOUNTER — Encounter: Payer: Self-pay | Admitting: Physician Assistant

## 2015-12-25 VITALS — BP 124/88 | Temp 98.7°F

## 2015-12-25 DIAGNOSIS — R070 Pain in throat: Secondary | ICD-10-CM

## 2015-12-25 MED ORDER — METHYLPREDNISOLONE 4 MG PO TBPK: ORAL_TABLET | ORAL | Status: DC

## 2015-12-25 NOTE — Progress Notes (Signed)
S: c/o left sided ear and throat pain, feels like she's going to bite the back of her tongue, having difficulty with swallowing and ear feels full; no fever/chills, no cp/sob, had a cough and was put on a zpack approx 1 month ago, still has a little cough, no otc meds  O: vitals wnl, nad, tms dull, no redness or fluid levels noted, throat appears normal, small tonsillith noted on r tonsil, no redness or drainage noted, neck supple no lymph, lungs c t a, cv rrr  A: sore throat  P: medrol dose pack, refer to ENT for further eval

## 2016-01-10 DIAGNOSIS — H5213 Myopia, bilateral: Secondary | ICD-10-CM | POA: Diagnosis not present

## 2016-01-17 DIAGNOSIS — R070 Pain in throat: Secondary | ICD-10-CM | POA: Diagnosis not present

## 2016-01-17 DIAGNOSIS — J309 Allergic rhinitis, unspecified: Secondary | ICD-10-CM | POA: Diagnosis not present

## 2016-03-20 ENCOUNTER — Encounter: Payer: Self-pay | Admitting: Physician Assistant

## 2016-03-20 ENCOUNTER — Ambulatory Visit: Payer: Self-pay | Admitting: Physician Assistant

## 2016-03-20 VITALS — BP 144/94 | HR 80 | Temp 98.5°F

## 2016-03-20 DIAGNOSIS — J309 Allergic rhinitis, unspecified: Secondary | ICD-10-CM

## 2016-03-20 MED ORDER — PREDNISONE 10 MG PO TABS: 30.0000 mg | ORAL_TABLET | Freq: Every day | ORAL | Status: DC

## 2016-03-20 NOTE — Progress Notes (Signed)
S: c/o runny nose, congestion,  some sinus pressure, sx for about a week, denies fever/chills/body aches, cough, cp/sob, or v/d  O: vitals wnl, nad, perrl eomi, conjunctiva wnl, tms dull, nasal mucosa swollen and boggy, throat wnl, neck supple no lymph, lungs c t a, cv rrr  A: acute seasonal allergies. Allergic sinusitis  P: saline nasal rinse, flonase, continue claritin, prednisone 30mg  qd x 3d

## 2016-08-07 DIAGNOSIS — Z0000 Encounter for general adult medical examination without abnormal findings: Secondary | ICD-10-CM | POA: Diagnosis not present

## 2016-08-07 DIAGNOSIS — Z1329 Encounter for screening for other suspected endocrine disorder: Secondary | ICD-10-CM | POA: Diagnosis not present

## 2016-08-07 DIAGNOSIS — Z13220 Encounter for screening for lipoid disorders: Secondary | ICD-10-CM | POA: Diagnosis not present

## 2016-08-07 DIAGNOSIS — Z01419 Encounter for gynecological examination (general) (routine) without abnormal findings: Secondary | ICD-10-CM | POA: Diagnosis not present

## 2016-08-07 DIAGNOSIS — Z131 Encounter for screening for diabetes mellitus: Secondary | ICD-10-CM | POA: Diagnosis not present

## 2016-08-07 DIAGNOSIS — Z1321 Encounter for screening for nutritional disorder: Secondary | ICD-10-CM | POA: Diagnosis not present

## 2016-08-07 DIAGNOSIS — Z3040 Encounter for surveillance of contraceptives, unspecified: Secondary | ICD-10-CM | POA: Diagnosis not present

## 2016-08-21 DIAGNOSIS — N852 Hypertrophy of uterus: Secondary | ICD-10-CM | POA: Diagnosis not present

## 2016-08-21 DIAGNOSIS — N920 Excessive and frequent menstruation with regular cycle: Secondary | ICD-10-CM | POA: Diagnosis not present

## 2016-08-21 DIAGNOSIS — N939 Abnormal uterine and vaginal bleeding, unspecified: Secondary | ICD-10-CM | POA: Diagnosis not present

## 2016-08-29 ENCOUNTER — Encounter: Payer: Self-pay | Admitting: Family Medicine

## 2016-08-29 ENCOUNTER — Ambulatory Visit (INDEPENDENT_AMBULATORY_CARE_PROVIDER_SITE_OTHER): Payer: 59 | Admitting: Family Medicine

## 2016-08-29 VITALS — BP 131/74 | HR 87 | Temp 98.3°F | Resp 16 | Ht 60.0 in | Wt 239.0 lb

## 2016-08-29 DIAGNOSIS — B373 Candidiasis of vulva and vagina: Secondary | ICD-10-CM

## 2016-08-29 DIAGNOSIS — B3731 Acute candidiasis of vulva and vagina: Secondary | ICD-10-CM

## 2016-08-29 MED ORDER — FLUCONAZOLE 150 MG PO TABS: ORAL_TABLET | ORAL | 0 refills | Status: DC

## 2016-08-29 NOTE — Progress Notes (Signed)
   Subjective:    Patient ID: Stacey Taylor, female    DOB: [birth year], 35 y.o.   MRN: [id number]  Stacey Taylor is a 35 y.o. female presenting on 08/29/2016 for Vaginitis  Patient presents for a same day appointment.  HPI   YEAST VAGINITIS: - Reports she normally uses topical baby powder in her pelvic area for her c-section skin fold to help with moisture, however recently few days ago she switched to topical vagisil powder in this area, worsening symptoms now with vaginal discharge, vaginal itching, and vaginal burning. Prior history of last yeast infection earlier this year, worse due to multiple antibiotics, but describes symptoms as nearly identical but this is less severe. - Followed by [facility] for endometriosis, also had prior cervical cancer, saw them recently without pelvic problem  - Denies recent sexual intercourse related to current symptoms - Denies fever/chills, abdominal pain, pelvic cramping, pelvic or groin rash or lesions  Social History  Substance Use Topics  . Smoking status: Never Smoker  . Smokeless tobacco: Never Used  . Alcohol use No    Review of Systems Per HPI unless specifically indicated above     Objective:    BP 131/74 (BP Location: Left Arm, Patient Position: Sitting, Cuff Size: Large)   Pulse 87   Temp 98.3 F (36.8 C) (Oral)   Resp 16   Ht 5' (1.524 m)   Wt 239 lb (108.4 kg)   BMI 46.68 kg/m   Wt Readings from Last 3 Encounters:  08/29/16 239 lb (108.4 kg)  11/29/15 237 lb 3.2 oz (107.6 kg)  11/23/15 246 lb (111.6 kg)    Physical Exam  Constitutional:  Well-appearing, comfortable, cooperative  Cardiovascular: Normal rate.   Pulmonary/Chest: Effort normal.  Genitourinary:  Genitourinary Comments: Declines pelvic exam today.  Neurological: She is alert.  Skin: No rash noted.       Assessment & Plan:   Problem List Items Addressed This Visit    None    Visit Diagnoses    Yeast vaginitis    -  Primary   Characteristic yeast vaginitis by history, deferred pelvic exam, empiric treat Diflucan 150 PO x 1, repeat Day 3, follow-up for pelvic if worsening.   Relevant Medications   fluconazole (DIFLUCAN) 150 MG tablet      Meds ordered this encounter  Medications  . fluconazole (DIFLUCAN) 150 MG tablet    Sig: Take one tablet by mouth on Day 1. Repeat dose 2nd tablet on Day 3.    Dispense:  2 tablet    Refill:  0    Follow up plan: Return in about 1 week (around 09/05/2016), or if symptoms worsen or fail to improve, for vaginitis.  Nobie Putnam, [facility] 08/29/2016, 2:14 PM

## 2016-08-29 NOTE — Patient Instructions (Signed)
Thank you for coming in to clinic today.   1. Your symptoms sound consistent with a Vaginitis - irritation from likely BV or yeast (in your case, Yeast is most likely) - For Yeast - will send Diflucan 150mg  pill, take 1 and then on Day 3 take 2nd pill.  Stop using Vagisil Topical Continue drying baby powder as needed Avoid excess moisture  If not improving vaginal discharge, itching, or develop rash or other concerns, recommend return for re-evaluation and we would do pelvic exam and swab test. Also you may follow-up with OB/GYN for this as well.  Please schedule a follow-up appointment with Dr. Parks Ranger within 1-2 weeks if worsening vaginitis  If you have any other questions or concerns, please feel free to call the clinic or send a message through [place]. You may also schedule an earlier appointment if necessary.  Nobie Putnam, DO [place]

## 2016-09-01 ENCOUNTER — Encounter: Payer: Self-pay | Admitting: Emergency Medicine

## 2016-09-01 ENCOUNTER — Encounter: Payer: Self-pay | Admitting: Family Medicine

## 2016-09-01 ENCOUNTER — Emergency Department: Payer: 59

## 2016-09-01 ENCOUNTER — Emergency Department
Admission: EM | Admit: 2016-09-01 | Discharge: 2016-09-01 | Disposition: A | Discharge status: Home / Self Care | Payer: 59 | Attending: Emergency Medicine | Admitting: Emergency Medicine

## 2016-09-01 ENCOUNTER — Ambulatory Visit (INDEPENDENT_AMBULATORY_CARE_PROVIDER_SITE_OTHER): Payer: 59 | Admitting: Family Medicine

## 2016-09-01 VITALS — BP 153/103 | HR 91 | Temp 98.2°F | Resp 16 | Ht 60.0 in | Wt 237.0 lb

## 2016-09-01 DIAGNOSIS — S7002XA Contusion of left hip, initial encounter: Secondary | ICD-10-CM | POA: Insufficient documentation

## 2016-09-01 DIAGNOSIS — M5442 Lumbago with sciatica, left side: Secondary | ICD-10-CM | POA: Diagnosis not present

## 2016-09-01 DIAGNOSIS — M25552 Pain in left hip: Secondary | ICD-10-CM | POA: Diagnosis not present

## 2016-09-01 DIAGNOSIS — Y929 Unspecified place or not applicable: Secondary | ICD-10-CM | POA: Diagnosis not present

## 2016-09-01 DIAGNOSIS — W06XXXA Fall from bed, initial encounter: Secondary | ICD-10-CM | POA: Diagnosis not present

## 2016-09-01 DIAGNOSIS — Y999 Unspecified external cause status: Secondary | ICD-10-CM | POA: Diagnosis not present

## 2016-09-01 DIAGNOSIS — Y9389 Activity, other specified: Secondary | ICD-10-CM | POA: Diagnosis not present

## 2016-09-01 DIAGNOSIS — W19XXXA Unspecified fall, initial encounter: Secondary | ICD-10-CM

## 2016-09-01 DIAGNOSIS — S79912A Unspecified injury of left hip, initial encounter: Secondary | ICD-10-CM | POA: Diagnosis not present

## 2016-09-01 MED ORDER — KETOROLAC TROMETHAMINE 30 MG/ML IJ SOLN
60.0000 mg | Freq: Once | INTRAMUSCULAR | Status: AC
  Administered 2016-09-01: 60 mg via INTRAMUSCULAR
  Filled 2016-09-01: qty 2

## 2016-09-01 MED ORDER — HYDROCODONE-ACETAMINOPHEN 5-325 MG PO TABS: 1.0000 | ORAL_TABLET | Freq: Four times a day (QID) | ORAL | 0 refills | Status: DC | PRN

## 2016-09-01 MED ORDER — CYCLOBENZAPRINE HCL 5 MG PO TABS: ORAL_TABLET | ORAL | 0 refills | Status: DC

## 2016-09-01 MED ORDER — IBUPROFEN 800 MG PO TABS: 800.0000 mg | ORAL_TABLET | Freq: Three times a day (TID) | ORAL | 0 refills | Status: DC | PRN

## 2016-09-01 MED ORDER — NAPROXEN 500 MG PO TABS: 500.0000 mg | ORAL_TABLET | Freq: Two times a day (BID) | ORAL | 0 refills | Status: DC

## 2016-09-01 NOTE — Patient Instructions (Signed)
Thank you for coming in to clinic today.  1. For your Back Pain - I think that this is due to Muscle Spasms from your Fall with Left Hip Contusion as well. Your Sciatic Nerve can be affected causing some of your radiation and numbness down your leg. 2. Start with anti-inflammatory Naprosyn (Naproxen) 500mg  twice daily (12 hrs apart, with food, breakfast and dinner) every day for next 2 weeks, then as needed, or max therapy up to 4 weeks if helping 3. Start Cyclobenzapine (Flexeril) 5mg  tablets - cut in half for 2.5 mg at night for muscle relaxant - may make you sedated or sleepy (be careful driving or working on this) if tolerated you can take every 8 hours, half or whole tab 4. May use Tylenol Extra Str 500mg  tabs - may take 1-2 tablets every 6 hours as needed - safe to take with Naproxen 5. Recommend to start using heating pad on your lower back 1-2x daily for few weeks  This pain may take weeks to months to fully resolve, but hopefully it will respond to the medicine initially. All back injuries (small or serious) are slow to heal since we use our back muscles every day. Be careful with turning, twisting, lifting, sitting / standing for prolonged periods, and avoid re-injury.  If your symptoms significantly worsen with more pain, or new symptoms with weakness in one or both legs, new or different shooting leg pains, numbness in legs or groin, loss of control or retention of urine or bowel movements, please call back for advice and you may need to go directly to the Emergency Department.  Please schedule a follow-up appointment with Dr. Parks Ranger in 1 week Monday 09/08/16 for re-evaluation Left Low Back Pain to return to work  If you have any other questions or concerns, please feel free to call the clinic or send a message through [place]. You may also schedule an earlier appointment if necessary.  Nobie Putnam, DO [place]

## 2016-09-01 NOTE — Discharge Instructions (Signed)
1. You may take medicines as needed for pain and muscle spasms (Motrin/Norco/Lexapro #15). 2. Apply ice to affected area several times daily. 3. Return to the ER for worsening symptoms, persistent vomiting, difficulty breathing or other concerns.
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
## 2016-09-01 NOTE — Assessment & Plan Note (Addendum)
Acute L LBP with associated L sciatica. Suspect likely due to muscle spasm/strain with L hip contusion and muscle injury, with low impact trauma fall out of bed. ED evaluation today with negative L hip x-ray. Some prior Thoracic DJD but no known lumbosacral OA.  - No red flag symptoms. Negative SLR for radiculopathy - Inadequate conservative therapy, has not started medications yet  Plan: 1. Start anti-inflammatory trial with rx Naprosyn 500mg  BID wc x 2-4 weeks, then PRN - discontinue Ibuprofen 800 rx from ED 2. Resume rx from ED muscle relaxant with Flexeril 5mg  tabs - take 2.5 to 5mg  up to TID PRN, titrate up as tolerated 3. May use Tylenol PRN for breakthrough 4. Encouraged use of heating pad 1-2x daily for now then PRN 5. Also patient given hydrocodone 5/325 in ED #15, I advised patient may not need to fill rx if does not need at this time, can use for breakthrough as prescribed by ED if she chooses 6. 

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
## 2016-09-08 ENCOUNTER — Encounter: Payer: Self-pay | Admitting: Family Medicine

## 2016-09-08 ENCOUNTER — Ambulatory Visit (INDEPENDENT_AMBULATORY_CARE_PROVIDER_SITE_OTHER): Payer: 59 | Admitting: Family Medicine

## 2016-09-08 VITALS — BP 150/84 | HR 79 | Temp 98.5°F | Resp 16 | Ht 60.0 in | Wt 238.0 lb

## 2016-09-08 DIAGNOSIS — M5442 Lumbago with sciatica, left side: Secondary | ICD-10-CM

## 2016-09-08 MED ORDER — GABAPENTIN 100 MG PO CAPS: ORAL_CAPSULE | ORAL | 0 refills | Status: DC

## 2016-09-08 MED ORDER — CYCLOBENZAPRINE HCL 5 MG PO TABS: ORAL_TABLET | ORAL | 0 refills | Status: DC

## 2016-09-08 NOTE — Patient Instructions (Signed)
Thank you for coming in to clinic today.  1. For your Back Pain -  - I'm encouraged you are feeling at least 50% better. This will take several more weeks to become 100%, but I think you'll be doing much better within next week. Goal is to AVOID re-injury, be careful with your activities and lifting, and let pain be your guide. - You may have a small amount of nerve irritation in your Sciatic nerve contributing to your pain down your Left leg - Continue anti-inflammatory Naprosyn (Naproxen) 500mg  twice daily (12 hrs apart, with food, breakfast and dinner) every day for next 1 week, then only take as needed, if you stop after a week and realize it was helping a lot, you can continue for another 1-2 weeks every day - Refilled Cyclobenzapine (Flexeril) 5mg  tablets take as needed - If you need additional pain relief, between doses of naproxen, feel free to take Tylenol 500-1000mg  per dose every 6-8 hours for 3 times a day - Continue heating pad as needed  Start Gabapentin 100mg  capsules, take at night for 2-3 nights only, and then increase to 2 times a day for a few days, and then may increase to 3 times a day, it may make you drowsy, if helps significantly at night only, then you can increase instead to 3 capsules at night, instead of 3 times a day  This pain may take weeks to months to fully resolve, but hopefully it will respond to the medicine initially. All back injuries (small or serious) are slow to heal since we use our back muscles every day. Be careful with turning, twisting, lifting, sitting / standing for prolonged periods, and avoid re-injury.  If your symptoms significantly worsen with more pain, or new symptoms with weakness in one or both legs, new or different shooting leg pains, numbness in legs or groin, loss of control or retention of urine or bowel movements, please call back for advice and you may need to go directly to the Emergency Department.  Please schedule a follow-up  appointment with Dr. Parks Ranger in 2 week for Low Back pain / FMLA work limits  If you have any other questions or concerns, please feel free to call the clinic or send a message through [place]. You may also schedule an earlier appointment if necessary.  Nobie Putnam, DO [place]

## 2016-09-08 NOTE — Assessment & Plan Note (Signed)
Improved acute L LBP with L sciatica following 1 week treatment and out of work. Likely underlying OA/DJD with fall and muscle contusion.  Plan: 1. Add Gabapentin 100mg  slow titration up to 100 TID vs 300 at night given sciatica symptoms 2. Continue Naproxen 500mg  BID for 1 more week then consider PRN vs continued for 1-2 weeks regular dosing 3. Refilled Flexeril 5mg  TID PRN 4. Advised maximize other conservative therapy with Tylenol for breakthrough, heating pad/moist heat 5. Discussed FMLA paperwork today - initial injury 09/01/16, concern for underlying back OA/DJD suspect this will be an underlying condition that may cause potential flare-ups with back pain episodes requiring treatment. FMLA paperwork to be completed for absence 1 week (09/01/16 - 09/08/16), return to work 09/09/16 without restrictions, but reduced work hours limited to 6 hour shifts up to 4 days a week, work note provided today, FMLA paperwork to be completed and submitted this week 6. Follow-up 2 weeks re-evaluate work status/FMLA

## 2016-09-08 NOTE — Progress Notes (Signed)
Subjective:    Patient ID: Stacey Taylor, female    DOB: 12/06/1980, 35 y.o.   MRN: [id number]  Stacey Taylor is a 35 y.o. female presenting on 09/08/2016 for Back Pain (follow up)  HPI  FOLLOW-UP LOW BACK PAIN, Left with sciatica: - Reports pain is improved since last visit 10/30 and would say she is about 50% improved now in 1 week, describes Left low back pain is no longer constant, worsening up to 4/10 with throbbing, worse with prolonged seated position, some difficulty with forward leaning and twisting to Left, also some sharp pains radiating on left side down to knee - Regarding medications, she initially tried Norco 5/325 x 2 early on and then stopped taking, she is taking Flexeril 5mg  BID with some relief, taking Naproxen 500mg  1-2 times daily, no more Tylenol, using topical heating pad - 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 Smoking status: Never Smoker  . Smokeless tobacco: Never Used  . Alcohol use No    Review of Systems  Musculoskeletal: Positive for back pain.   Per HPI unless specifically indicated above     Objective:    BP (!) 150/84   Pulse 79   Temp 98.5 F (36.9 C) (Oral)   Resp 16   Ht 5' (1.524 m)   Wt 238 lb (108 kg)   LMP 08/02/2016   BMI 46.48 kg/m   Wt Readings from Last 3 Encounters:  09/08/16 238 lb (108 kg)  09/01/16 237 lb (107.5 kg)  09/01/16 240 lb (108.9 kg)    Physical Exam  Constitutional: She appears well-developed and well-nourished. No  distress.  Well-appearing, uncomfortable with left low back pain, cooperative  Musculoskeletal: She exhibits no edema.  Low Back Inspection: Normal appearance, Large body habitus, no spinal deformity, symmetrical. Palpation: No tenderness over spinous processes. Persistent mild tender left Lumbar paraspinal with mild spasm of muscles, also over Left piriformis buttocks region. ROM: Improved forward flexion / Back extension, and R rotation, some very mild limited L rotation and not fully able to forward flex Special Testing: Stable to unchanged Seated SLR negative for radicular pain bilaterally, but positive tightness only in back and legs Strength: Bilateral hip flex/ext 5/5, knee flex/ext 5/5, ankle dorsiflex/plantarflex 5/5 Neurovascular: intact distal sensation to light touch  Neurological: She is alert.  Skin: Skin is warm and dry. She is not diaphoretic.  Nursing note and vitals reviewed.      Assessment & Plan:   Problem List Items Addressed This Visit    Acute left-sided low back pain with left-sided sciatica - Primary    Improved acute L LBP with L sciatica following 1 week treatment and out of work. Likely underlying OA/DJD with fall and muscle contusion.  Plan: 1. Add Gabapentin 100mg  slow titration up to 100 TID vs 300 at night given sciatica symptoms 2. Continue Naproxen 500mg  BID for 1 more week then consider PRN vs continued for 1-2 weeks regular dosing 3. Refilled Flexeril 5mg  TID PRN 4.  Advised maximize other conservative therapy with Tylenol for breakthrough, heating pad/moist heat 5. Discussed FMLA paperwork today - initial injury 09/01/16, concern for underlying back OA/DJD suspect this will be an underlying condition that may cause potential flare-ups with back pain episodes requiring treatment. FMLA paperwork to be completed for absence 1 week (09/01/16 - 09/08/16), return to work 09/09/16 without restrictions, but reduced work hours limited to 6 hour shifts up to 4 days  a week, work note provided today, FMLA paperwork to be completed and submitted this week 6. Follow-up 2 weeks re-evaluate work status/FMLA      Relevant Medications   cyclobenzaprine (FLEXERIL) 5 MG tablet   gabapentin (NEURONTIN) 100 MG capsule      Meds ordered this encounter  Medications  . cyclobenzaprine (FLEXERIL) 5 MG tablet    Sig: 1 tablet every 8 hours as needed for muscle spasms    Dispense:  30 tablet    Refill:  0  . gabapentin (NEURONTIN) 100 MG capsule    Sig: Start 1 capsule daily, every 2-3 days as tolerated increase by 1 capsule up to 3 times a day, or may take 3 at once in evening.    Dispense:  30 capsule    Refill:  0      Follow up plan: Return in about 2 weeks (around 09/22/2016) for Low Back Pain / FMLA work.  Nobie Putnam, DO [facility] 09/08/2016, 8:21 PM

## 2016-09-09 ENCOUNTER — Encounter: Payer: Self-pay | Admitting: Family Medicine

## 2016-09-09 DIAGNOSIS — M5442 Lumbago with sciatica, left side: Secondary | ICD-10-CM

## 2016-09-09 DIAGNOSIS — G8929 Other chronic pain: Secondary | ICD-10-CM | POA: Insufficient documentation

## 2016-09-09 NOTE — Progress Notes (Signed)
FMLA Information Received FMLA paperwork for patient on 09/08/16 for initial request. Diagnosis / Indication: Acute on Chronic Low Back Pain with Left sided Sciatica (ICD10: M54.24, G89.29) Symptoms: Left lower back pain with sciatica, following recent fall Job limitations: Heavy lifting (patients), prolonged sitting or standing Initial office visit 09/01/16 (date of injury), follow-up 09/08/16, next visit scheduled 09/22/16 Hospitalizations: n/a Incapacitated dates: 08/3016 - 09/08/16 (out), then return with reduced hours for 2 weeks, 09/09/16 to 09/22/16, up to 4 days a week, 6 hours per shift. Frequency of requested intermittent medical leave: Up to 2 flare ups per year, 3 days per flare, doctors visit / treatment for 24 hours (with recovery) Treatment: NSAIDs, muscle relaxant, may need prednisone in future, consider PT Additional appointments necessary for treatment: Yes  Discussed FMLA paperwork with patient during office visit on 09/08/16. Reviewed the above information.  Completed, signed, and dated FMLA paperwork on 09/09/16. Returned to [facility] staff to be faxed, and to be scanned into chart.  Nobie Putnam, [facility] 09/09/2016, 5:42 PM

## 2016-09-22 ENCOUNTER — Encounter: Payer: Self-pay | Admitting: Family Medicine

## 2016-09-22 ENCOUNTER — Ambulatory Visit (INDEPENDENT_AMBULATORY_CARE_PROVIDER_SITE_OTHER): Payer: 59 | Admitting: Family Medicine

## 2016-09-22 VITALS — BP 138/99 | HR 83 | Temp 98.2°F | Resp 16 | Ht 60.0 in | Wt 238.0 lb

## 2016-09-22 DIAGNOSIS — M5442 Lumbago with sciatica, left side: Secondary | ICD-10-CM | POA: Diagnosis not present

## 2016-09-22 DIAGNOSIS — M25552 Pain in left hip: Secondary | ICD-10-CM | POA: Diagnosis not present

## 2016-09-22 DIAGNOSIS — G8929 Other chronic pain: Secondary | ICD-10-CM | POA: Diagnosis not present

## 2016-09-22 MED ORDER — NAPROXEN 500 MG PO TABS: 500.0000 mg | ORAL_TABLET | Freq: Two times a day (BID) | ORAL | 1 refills | Status: DC

## 2016-09-22 NOTE — Progress Notes (Signed)
Subjective:    Patient ID: Stacey Taylor, female    DOB: 08-06-1981, 35 y.o.   MRN: [id number]  Stacey Taylor is a 35 y.o. female presenting on 09/22/2016 for Back Pain (follow up)  HPI   FOLLOW-UP LOW BACK PAIN, Left with sciatica: - Last visit 09/08/16 with me for follow-up of same problem of low back pain, initial visit 09/01/16 for acute injury - She is currently on FMLA work restrictions for past 2 weeks with working 4 days a week up to 6 hours per shift, see chart documentation for other details - Today she is continuing to improve gradually, now at about 75% improved. Still has some low back pain every day, some times are better than others, tried one day without any medication with a set back. Describes good days with reduced pain about 2/10 with some aching and bad days up to 5-7/10 with throbbing and occasional left lower leg radiation is more of an aching pain down thigh not further - Taking Naproxen 500mg  about once daily at night (previously took Naproxen BID for 1-2 weeks), along with Gabapentin 100mg  nightly, Flexeril 5mg  at night, not taking regular Tylenol - She eager to return to work but concerned about possible re injury, interested in resuming regular duty next week - History of back osteoarthritis and DJD - Denies any fevers/chills, numbness, tingling, weakness, loss of control bladder/bowel incontinence or retention, unintentional wt loss, night sweats, fall or injury   Social History  Substance Use Topics  . Smoking status: Never Smoker  . Smokeless tobacco: Never Used  . Alcohol use No    Review of Systems   Per HPI unless specifically indicated above     Objective:    BP (!) 138/99 (BP Location: Left Arm, Patient Position: Sitting, Cuff Size: Large)   Pulse 83   Temp 98.2 F (36.8 C) (Oral)   Resp 16   Ht 5' (1.524 m)   Wt 238 lb (108 kg)   LMP 08/02/2016   BMI 46.48 kg/m   Wt Readings from Last 3 Encounters:  09/22/16 238 lb (108 kg)    09/08/16 238 lb (108 kg)  09/01/16 237 lb (107.5 kg)    Physical Exam  Constitutional: She appears well-developed and well-nourished. No distress.  Well-appearing, comfortable, cooperative  HENT:  Head: Normocephalic and atraumatic.  Mouth/Throat: Oropharynx is clear and moist.  Cardiovascular: Normal rate and intact distal pulses.   Pulmonary/Chest: Effort normal.  Musculoskeletal: She exhibits no edema.  Low Back Inspection: Normal appearance, Large body habitus, no spinal deformity, symmetrical. Palpation: No tenderness over spinous processes. Improved left lower lumbar muscle spasm. Some localized mild tenderness over piriformis buttocks region ROM: Continued improved active ROM mostly normal, forward flexion / Back extension, and R rotation Special Testing: Stable to unchanged Seated SLR negative for radicular pain bilaterally Strength: Bilateral hip flex/ext 5/5, knee flex/ext 5/5, ankle dorsiflex/plantarflex 5/5 Neurovascular: intact distal sensation to light touch  Neurological: She is alert.  Skin: Skin is warm and dry. No rash noted. She is not diaphoretic.  Nursing note and vitals reviewed.      Assessment & Plan:   Problem List Items Addressed This Visit    Chronic low back pain with left-sided sciatica - Primary    Continued improved flare of chronic L LBP with L sciatica following acute fall with flare OA/DJD, now s/p 3 weeks treatment and resumed work with restrictions for past 2 weeks (FMLA)  Plan: 1. Refilled Naproxen 500mg  BID  wc for 2-3 more weeks, given increased hours next week, and she has some increasing pain with increased working, then use PRN 2. Continue Gabapentin 100mg  nightly (may titrate but does not seem eager to due to sedation) finish up to 3 more weeks then not planning to refill, continue Flexeril 5mg  at night 3. Advised to use Tylenol PRN breakthrough, heating pad PRN 4. Continue 1 more week at work restrictions 4 days a week 6 hours per shift,  resume full regular duty starting Monday 09/29/16, letter written and given to patient 5. Follow-up 6 weeks as needed for persistent low back pain or worsening, can consider lumbar x-ray, maybe trial on PT      Relevant Medications   naproxen (NAPROSYN) 500 MG tablet    Other Visit Diagnoses    Left hip pain       Relevant Medications   naproxen (NAPROSYN) 500 MG tablet      Meds ordered this encounter  Medications  . naproxen (NAPROSYN) 500 MG tablet    Sig: Take 1 tablet (500 mg total) by mouth 2 (two) times daily with a meal. For 2 weeks then as needed    Dispense:  60 tablet    Refill:  1      Follow up plan: Return in about 6 weeks (around 11/03/2016), or if symptoms worsen or fail to improve, for low back pain.  Nobie Putnam, [facility] 09/22/2016, 3:56 PM

## 2016-09-22 NOTE — Assessment & Plan Note (Signed)
Continued improved flare of chronic L LBP with L sciatica following acute fall with flare OA/DJD, now s/p 3 weeks treatment and resumed work with restrictions for past 2 weeks (FMLA)  Plan: 1. Refilled Naproxen 500mg  BID wc for 2-3 more weeks, given increased hours next week, and she has some increasing pain with increased working, then use PRN 2. Continue Gabapentin 100mg  nightly (may titrate but does not seem eager to due to sedation) finish up to 3 more weeks then not planning to refill, continue Flexeril 5mg  at night 3. Advised to use Tylenol PRN breakthrough, heating pad PRN 4. Continue 1 more week at work restrictions 4 days a week 6 hours per shift, resume full regular duty starting Monday 09/29/16, letter written and given to patient 5. Follow-up 6 weeks as needed for persistent low back pain or worsening, can consider lumbar x-ray, maybe trial on PT

## 2016-09-22 NOTE — Patient Instructions (Addendum)
Thank you for coming in to clinic today.  1. For your Back Pain -  - I'm encouraged you are feeling about 75% better. This will take several more weeks to become 100%, but should be good to return to work in 1 week. Goal is to AVOID re-injury, be careful with your activities and lifting, and let pain be your guide. - Resume the Naproxen 500mg  twice daily (12 hrs apart, with food, breakfast and dinner) every day for next 2-3 weeks (probably will need at least 2 weeks while working), then only take as needed - Take Flexeril 5mg  or can increase to 2 tabs at night if acutely needed - If you need additional pain relief, between doses of naproxen, feel free to take Tylenol 500-1000mg  per dose every 6-8 hours for 3 times a day - Continue heating pad as needed  Keep taking gabapentin 100mg  nightly, finish entire course should be about 3 more weeks.  This pain may take weeks to months to fully resolve, but hopefully it will respond to the medicine initially. All back injuries (small or serious) are slow to heal since we use our back muscles every day. Be careful with turning, twisting, lifting, sitting / standing for prolonged periods, and avoid re-injury.  If your symptoms significantly worsen with more pain, or new symptoms with weakness in one or both legs, new or different shooting leg pains, numbness in legs or groin, loss of control or retention of urine or bowel movements, please call back for advice and you may need to go directly to the Emergency Department.  Please schedule a follow-up appointment with Dr. Parks Ranger in 6 weeks as needed for Low Back pain if not improved.  If you have any other questions or concerns, please feel free to call the clinic or send a message through [place]. You may also schedule an earlier appointment if necessary.  Nobie Putnam, DO [place]

## 2016-10-04 IMAGING — CR DG CHEST 2V
1 series · 2 of 2 positions shown · non-contrast
Comparison: None.

CLINICAL DATA: Productive cough.

EXAM:
CHEST  2 VIEW

[Series 1: pa · 0.17mm/px · 2 of 2 slices shown]
[im 1/2]
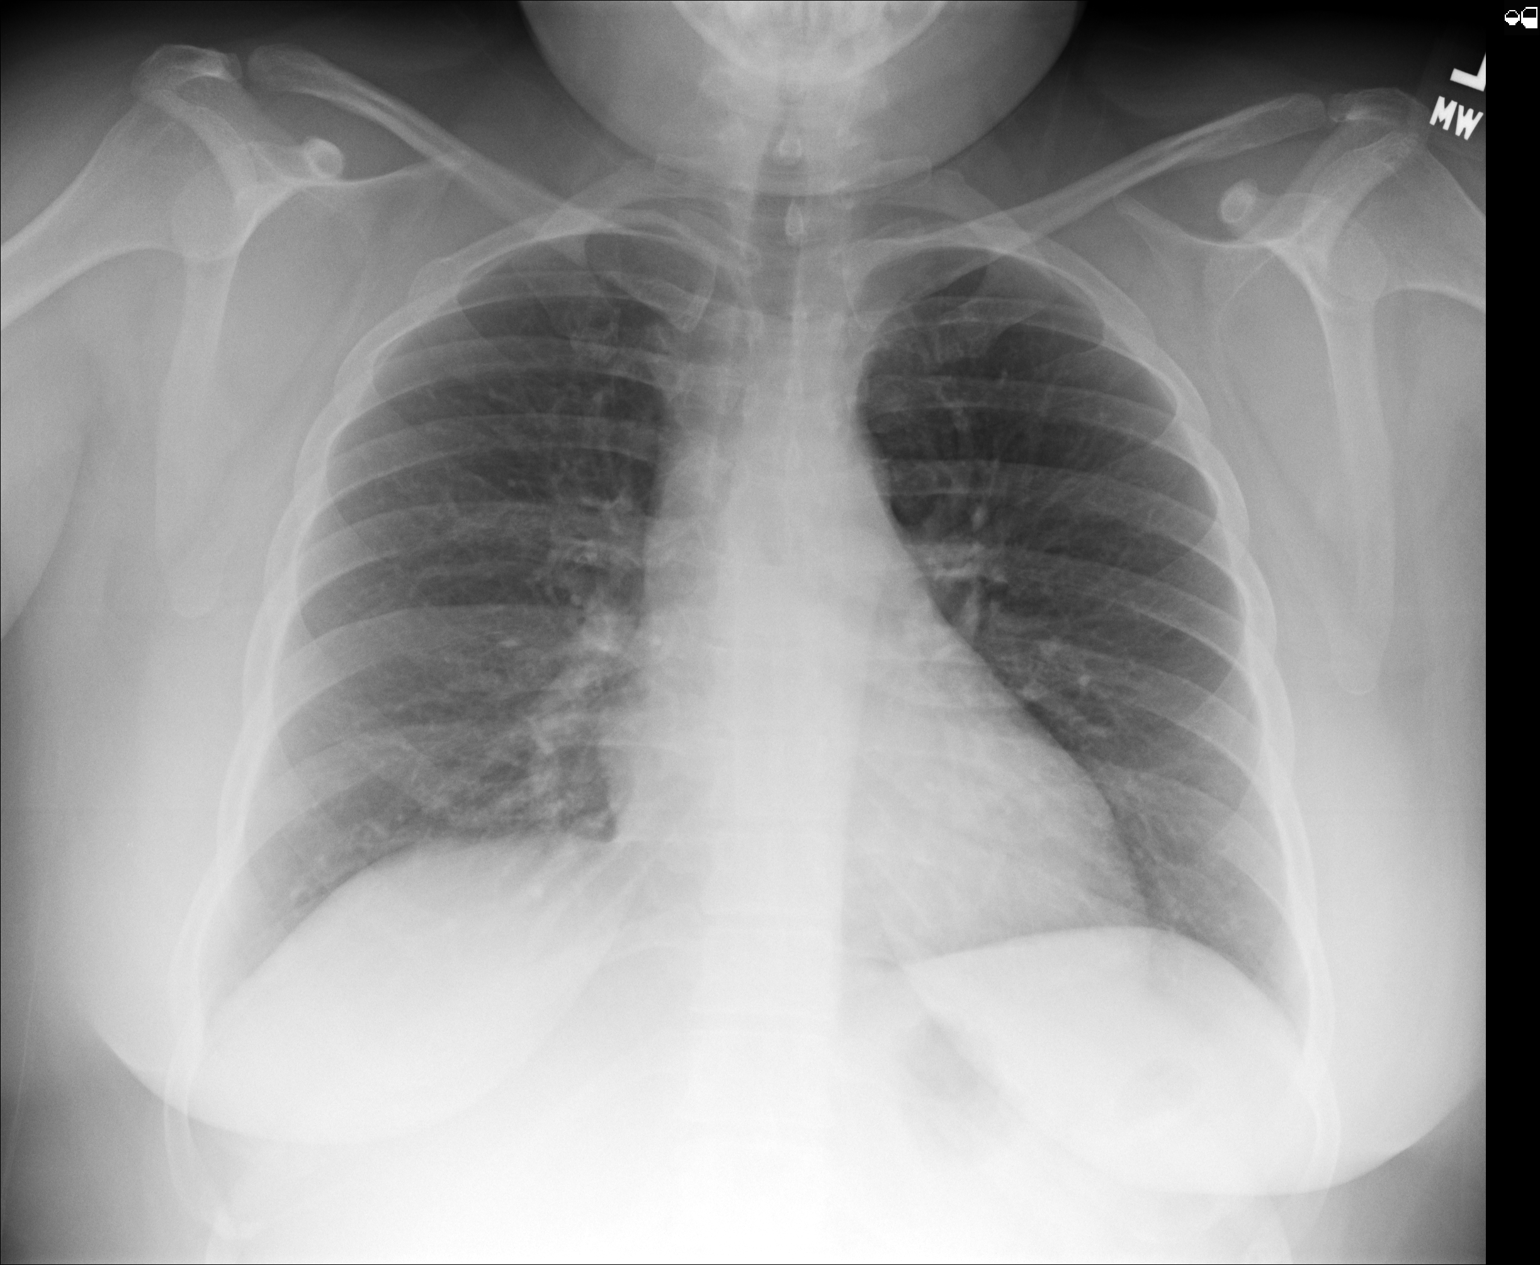
[im 2/2]
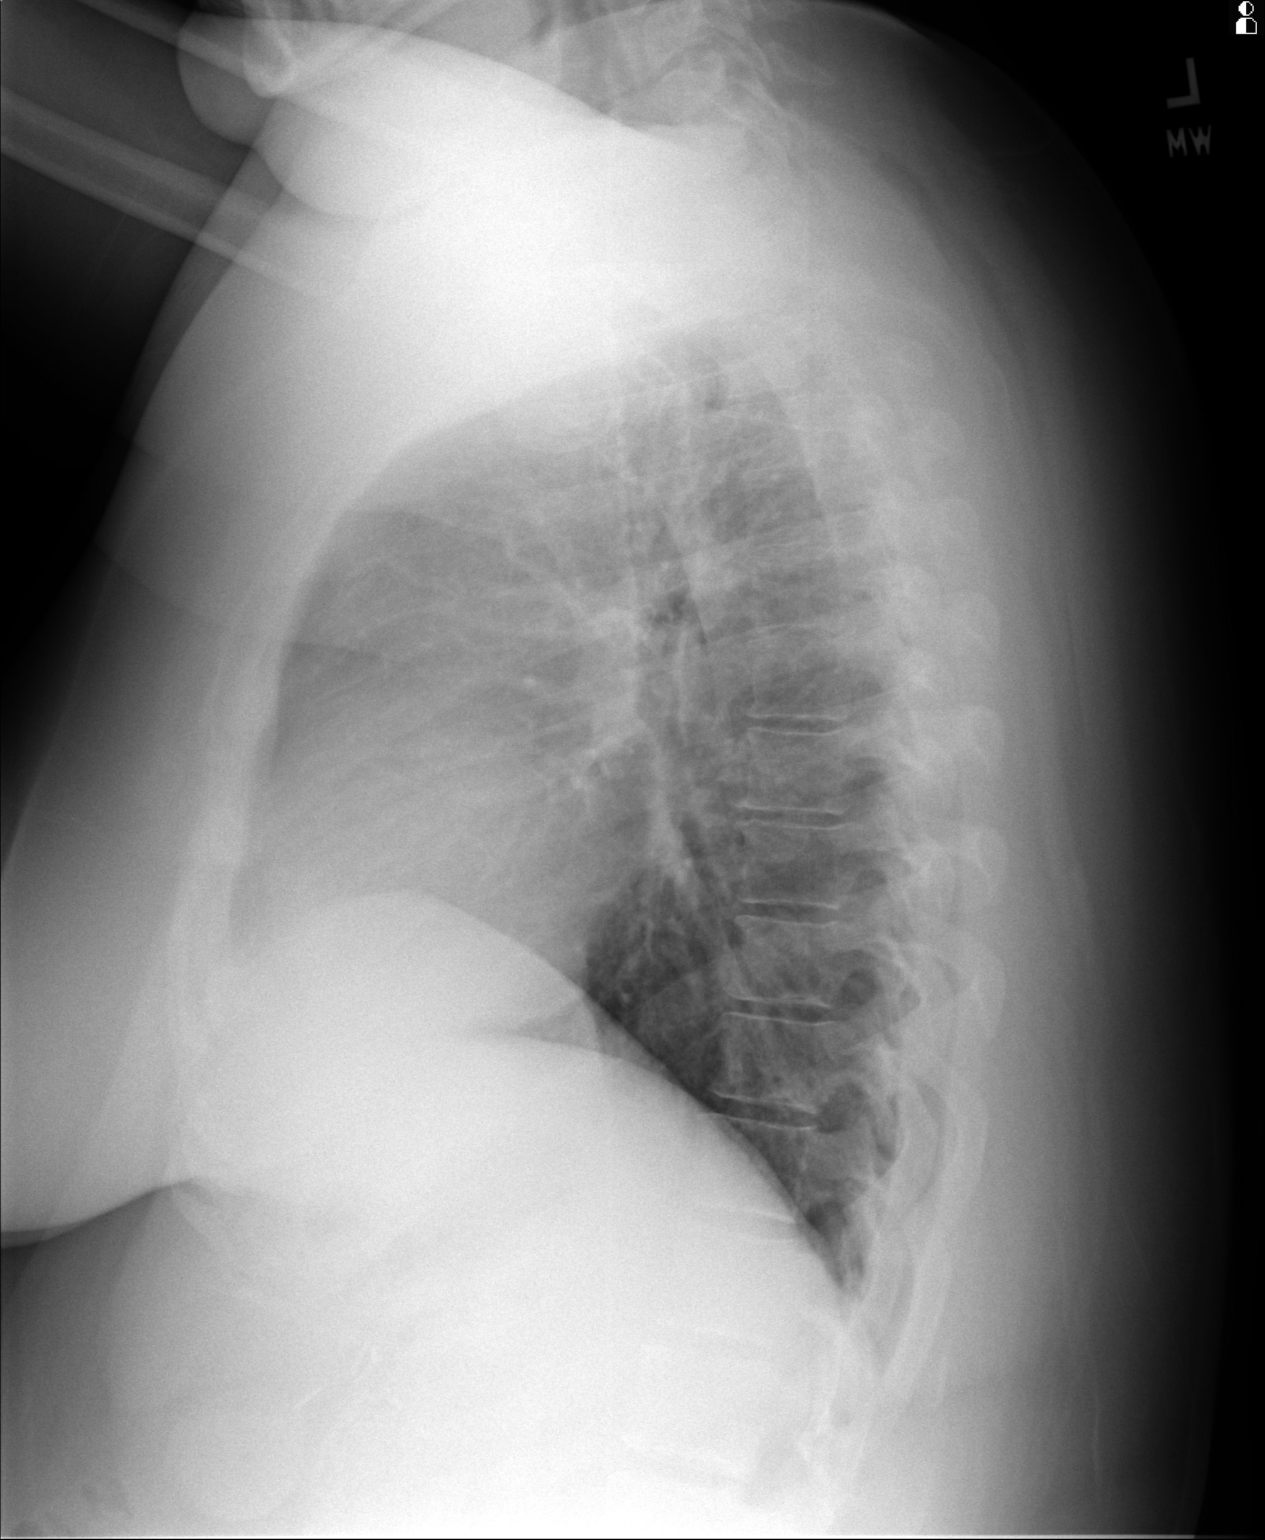

[2 of 2 positions shown; findings below may reference images not displayed]

FINDINGS: The heart size and mediastinal contours are within normal limits.
Both lungs are clear. No pneumothorax or pleural effusion is noted.
The visualized skeletal structures are unremarkable.
IMPRESSION: No active cardiopulmonary disease.

## 2017-06-04 DIAGNOSIS — H5213 Myopia, bilateral: Secondary | ICD-10-CM | POA: Diagnosis not present

## 2017-06-30 ENCOUNTER — Encounter: Payer: Self-pay | Admitting: Nurse Practitioner

## 2017-06-30 ENCOUNTER — Ambulatory Visit (INDEPENDENT_AMBULATORY_CARE_PROVIDER_SITE_OTHER): Payer: 59 | Admitting: Nurse Practitioner

## 2017-06-30 VITALS — BP 144/79 | HR 105 | Temp 98.6°F | Resp 16 | Ht 60.0 in | Wt 240.0 lb

## 2017-06-30 DIAGNOSIS — J069 Acute upper respiratory infection, unspecified: Secondary | ICD-10-CM | POA: Diagnosis not present

## 2017-06-30 DIAGNOSIS — J029 Acute pharyngitis, unspecified: Secondary | ICD-10-CM

## 2017-06-30 LAB — POCT RAPID STREP A (OFFICE): Rapid Strep A Screen: NEGATIVE

## 2017-06-30 MED ORDER — IPRATROPIUM BROMIDE 0.06 % NA SOLN: 2.0000 | Freq: Four times a day (QID) | NASAL | 0 refills | Status: DC

## 2017-06-30 NOTE — Progress Notes (Signed)
I have reviewed this encounter including the documentation in this note and/or discussed this patient with the provider, Cassell Smiles, AGPCNP-BC. I am certifying that I agree with the content of this note as supervising physician.  Nobie Putnam, [facility] 06/30/2017, 4:50 PM

## 2017-06-30 NOTE — Patient Instructions (Addendum)
Demetrica, Thank you for coming in to clinic today.  1. It sounds like you have a Upper Respiratory Virus - this will most likely run it's course in 7 to 10 days. Recommend good hand washing. - Start Atrovent nasal spray decongestant 2 sprays each nostril up to 4 times daily for 5-7 days - Continue anti-histamine loratadine 10mg  daily (Prefer without decongestant) - also can use Flonase 2 sprays each nostril daily for at least 2 weeks and up to 4-6 weeks - If congestion is worse, start OTC Mucinex or guaifenesin generic (or may try Mucinex-DM for cough) up to 7-10 days then stop - Drink plenty of fluids to improve congestion - You may try over the counter Nasal Saline spray (Simply Saline, Ocean Spray) as needed to reduce congestion. - Drink warm herbal tea with honey for sore throat. - Start taking Tylenol extra strength 1 to 2 tablets every 6-8 hours for aches or fever/chills for next few days as needed.  Do not take more than 3,000 mg in 24 hours from all medicines.  May take Ibuprofen as well if tolerated 200-400mg  every 8 hours as needed.  If symptoms significantly worsening with persistent fevers/chills despite tylenol/ibpurofen, nausea, vomiting unable to tolerate food/fluids or medicine, body aches, or shortness of breath, sinus pain pressure or worsening productive cough, then follow-up for re-evaluation, may seek more immediate care at Urgent Care or ED if more concerned for emergency.   2. Call on Thursday if symptoms continue without improvement.    Please schedule a follow-up appointment with Cassell Smiles, AGNP. Call clinic in 3-5 days if symptoms worsen or fail to improve.   If you have any other questions or concerns, please feel free to call the clinic or send a message through [place]. You may also schedule an earlier appointment if necessary.  You will receive a survey after today's visit either digitally by e-mail or paper by [employer]. Your experiences and feedback matter to  [place].  Please respond so we know how we are doing as we provide care for you.   Cassell Smiles, DNP, AGNP-BC Adult Gerontology Nurse Practitioner [facility]

## 2017-06-30 NOTE — Progress Notes (Signed)
Subjective:    Patient ID: Stacey [facility], female    DOB: Feb 18, 1981, 36 y.o.   MRN: [id number]  Stacey [facility] is a 36 y.o. female presenting on 06/30/2017 for Sinusitis (onset 6 days OTC not improved Sx HA facial pressure nasal drainage )   HPI Sinus pressure Now has sinus and nasal congestion and drainage w/ postnasal drainage.  Increased pressure w/ significant pain.  Endorses tooth and jaw pain.  Relief w/ decongestant. - Has used claritin-D 24 hr x 5 days and usually needs to take w/ spring and fall.  - No reported fever, chills, sweats, nausea, vomiting, diarrhea, constipation. Small cough, minimal ear pain,  - Allergy symptoms started about 7 days ago.  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  - has had migraine w/ sinus headaches w/ worsening of symptom burden. However, not new problem.  Pt has 3-4 migraines per month.  Social History  Substance Use Topics  . Smoking status: Never Smoker  . Smokeless tobacco: Never Used  . Alcohol use No    Review of Systems Per HPI unless specifically indicated above     Objective:    BP (!) 144/79   Pulse (!) 105   Temp 98.6 F (37 C) (Oral)   Resp 16   Ht 5' (1.524 m)   Wt 240 lb (108.9 kg)   SpO2 98%   BMI 46.87 kg/m   Wt Readings from Last 3 Encounters:  06/30/17 240 lb (108.9 kg)  09/22/16 238 lb (108 kg)  09/08/16 238 lb (108 kg)    Physical Exam  Constitutional: She is oriented to person, place, and time. She appears well-developed and well-nourished. No distress.  HENT:  Head: Normocephalic and atraumatic.  Right Ear: Hearing, tympanic membrane, external ear and ear canal normal.  Left Ear: Hearing, tympanic membrane, external ear and ear canal normal.  Nose: Mucosal edema and rhinorrhea present. Right sinus exhibits maxillary sinus tenderness and frontal sinus tenderness. Left sinus exhibits maxillary sinus tenderness and frontal sinus tenderness.  Mouth/Throat: Uvula  is midline and mucous membranes are normal. Posterior oropharyngeal edema and posterior oropharyngeal erythema present. No oropharyngeal exudate.  Tonsillar edema +2 Mallampati Score 1 - Complete visualization of entire oropharynx soft palate  Eyes: Pupils are equal, round, and reactive to light. Conjunctivae and EOM are normal.  Neck: Normal range of motion. Neck supple.  Cardiovascular: Normal rate, regular rhythm and normal heart sounds.   Pulmonary/Chest: Effort normal and breath sounds normal. No respiratory distress.  Lymphadenopathy:    She has cervical adenopathy.  Neurological: She is alert and oriented to person, place, and time.  Skin: Skin is warm and dry.  Psychiatric: She has a normal mood and affect. Her behavior is normal. Judgment and thought content normal.  Vitals reviewed.  Results for orders placed or performed in visit on 06/30/17  POCT rapid strep A  Result Value Ref Range   Rapid Strep A Screen Negative Negative       Assessment & Plan:   Problem List Items Addressed This Visit    None    Visit Diagnoses    Viral URI    -  Primary Acute illness no fever noted.  Symptoms not worsening, but are persistent. Consistent with viral illness x 5 days with 2 known sick contacts and no identifiable focal infections of ears, nose, throat.  Plan: 1. Reassurance, likely self-limited with cough lasting up to few weeks - Start  Atrovent nasal spray decongestant 2 sprays each nostril up to 4 times daily for 5-7 days - Continue anti-histamine Loratadine 10mg  daily,  - also can use Flonase 2 sprays each nostril daily for up to 4-6 weeks - Start Mucinex-DM OTC up to 7-10 days then stop -Discussed should avoid decongestants w/ elevated BP and HR. 2. Supportive care with nasal saline, warm herbal tea with honey, 3. Improve hydration 4. Tylenol / Motrin PRN fevers 5. Return criteria given.  Will prescribe antibiotics no earlier than 2 days from now for persistent symptoms.   Discussed antibiotic resistance and no usefulness for antibiotics w/ viral infection.  Pt to call 3-5 days if symptoms persist.    Relevant Medications   ipratropium (ATROVENT) 0.06 % nasal spray   Sore throat     Pt w/ sore throat and erythema of oropharynx.  Plan: 1. Rapid strep today - result = negative.   Relevant Orders   POCT rapid strep A      Meds ordered this encounter  Medications  . HYDROcodone-acetaminophen (NORCO/VICODIN) 5-325 MG tablet    Sig: hydrocodone 5 mg-acetaminophen 325 mg tablet  . ibuprofen (ADVIL,MOTRIN) 800 MG tablet    Sig: ibuprofen 800 mg tablet    Follow up plan: Return 3-5 days if symptoms worsen or fail to improve.   Cassell Smiles, DNP, AGPCNP-BC Adult Gerontology Primary Care Nurse Practitioner [facility] 06/30/2017, 4:02 PM

## 2017-07-01 ENCOUNTER — Ambulatory Visit: Payer: Self-pay | Admitting: Physician Assistant

## 2017-07-01 VITALS — BP 148/90 | HR 82 | Temp 97.8°F

## 2017-07-01 DIAGNOSIS — J0100 Acute maxillary sinusitis, unspecified: Secondary | ICD-10-CM

## 2017-07-01 MED ORDER — FLUCONAZOLE 150 MG PO TABS: ORAL_TABLET | ORAL | 0 refills | Status: DC

## 2017-07-01 MED ORDER — MOMETASONE FUROATE 50 MCG/ACT NA SUSP: 2.0000 | Freq: Every day | NASAL | 12 refills | Status: DC

## 2017-07-01 MED ORDER — METHYLPREDNISOLONE 4 MG PO TBPK: ORAL_TABLET | ORAL | 0 refills | Status: DC

## 2017-07-01 MED ORDER — AMOXICILLIN 875 MG PO TABS: 875.0000 mg | ORAL_TABLET | Freq: Two times a day (BID) | ORAL | 0 refills | Status: DC

## 2017-07-01 NOTE — Progress Notes (Signed)
S: C/o sinus pain and congestion for 8 days, no fever, chills, cp/sob, v/d; mucus is yellow and thick, cough is sporadic, c/o of facial and dental pain. Can't breathe through her nose, saw her pcp yesterday and given a new nose spray, states "I feel feverish and achey"  Using otc meds:   O: PE: vitals w elevated bp otherwise wnl, nad, perrl eomi, normocephalic, tms dull, nasal mucosa red and swollen shut, throat injected, neck supple no lymph, lungs c t a, cv rrr, neuro intact  A:  Acute sinusitis   P: drink fluids, continue regular meds , use otc meds of choice, return if not improving in 5 days, return earlier if worsening, amoxil, medrol dose pack, nasonex, diflucan if needed

## 2017-07-08 IMAGING — CR DG HIP (WITH OR WITHOUT PELVIS) 2-3V*L*
3 series · 3 of 3 positions shown · non-contrast
Comparison: None.

CLINICAL DATA: Fell out of bed.

EXAM:
DG HIP (WITH OR WITHOUT PELVIS) 2-3V LEFT

[pelvis ap]
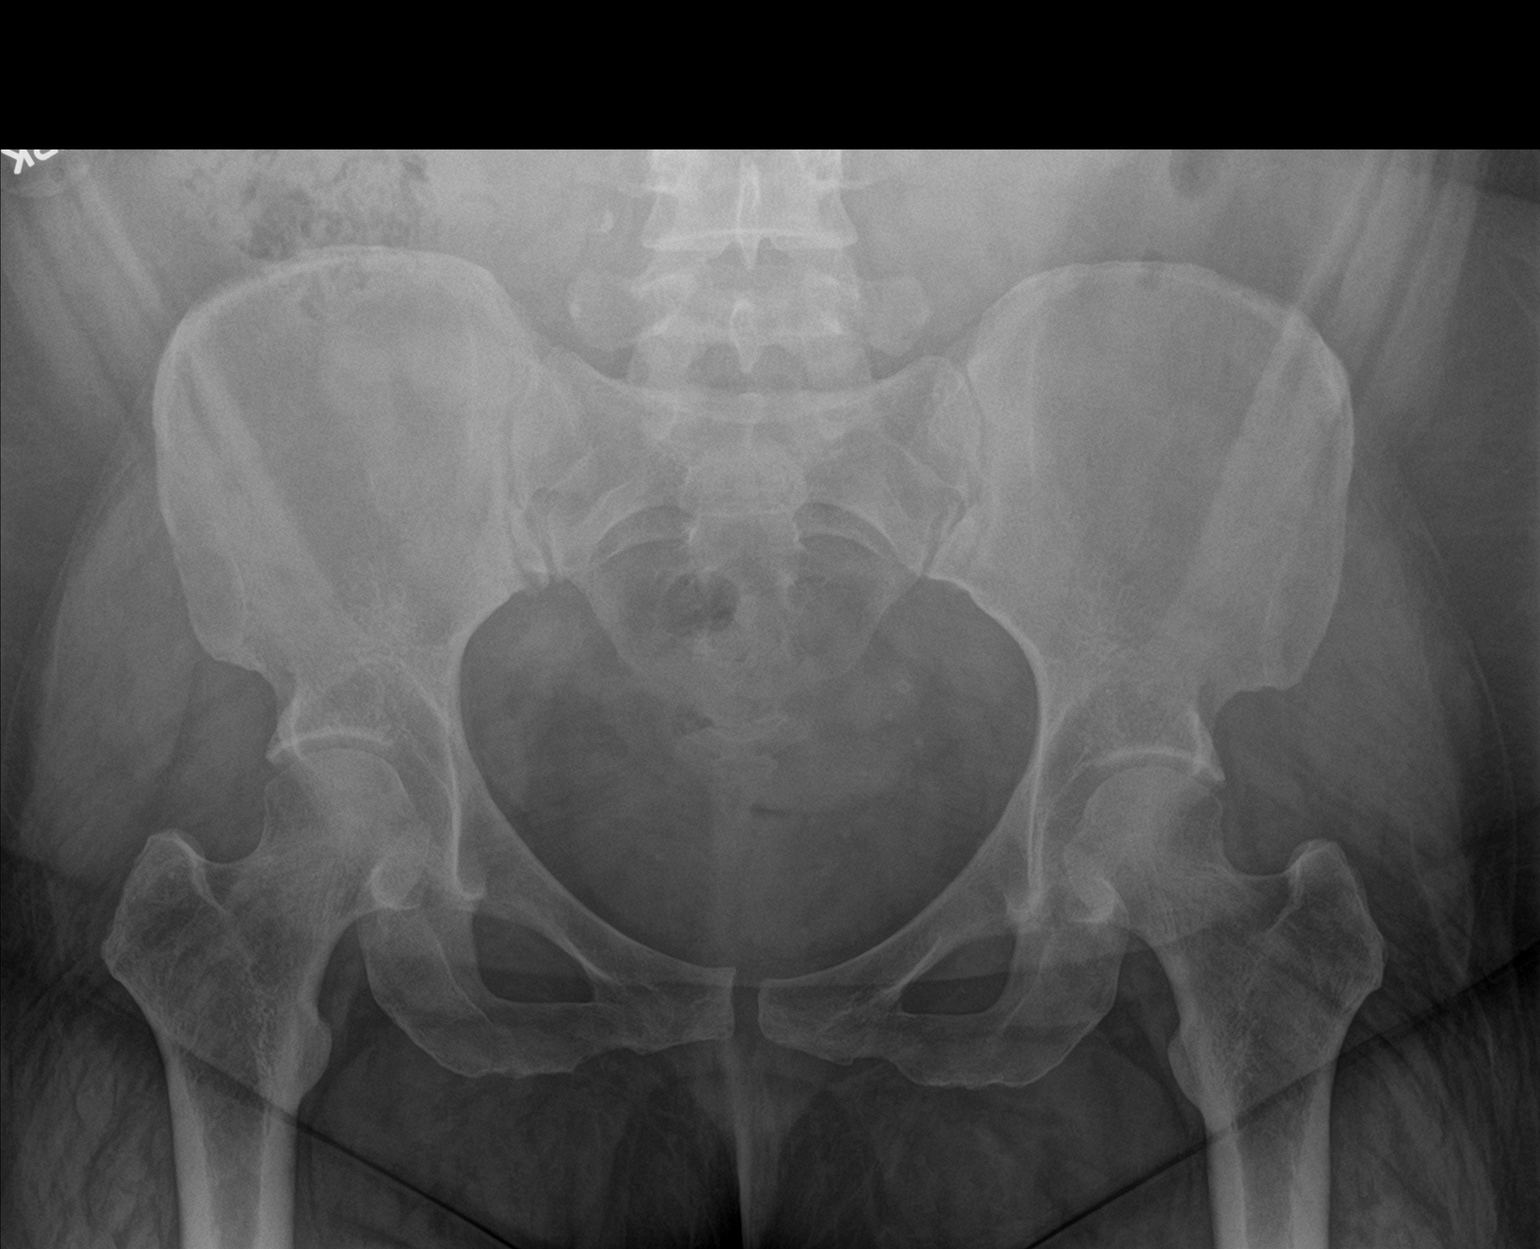

[hip ap]
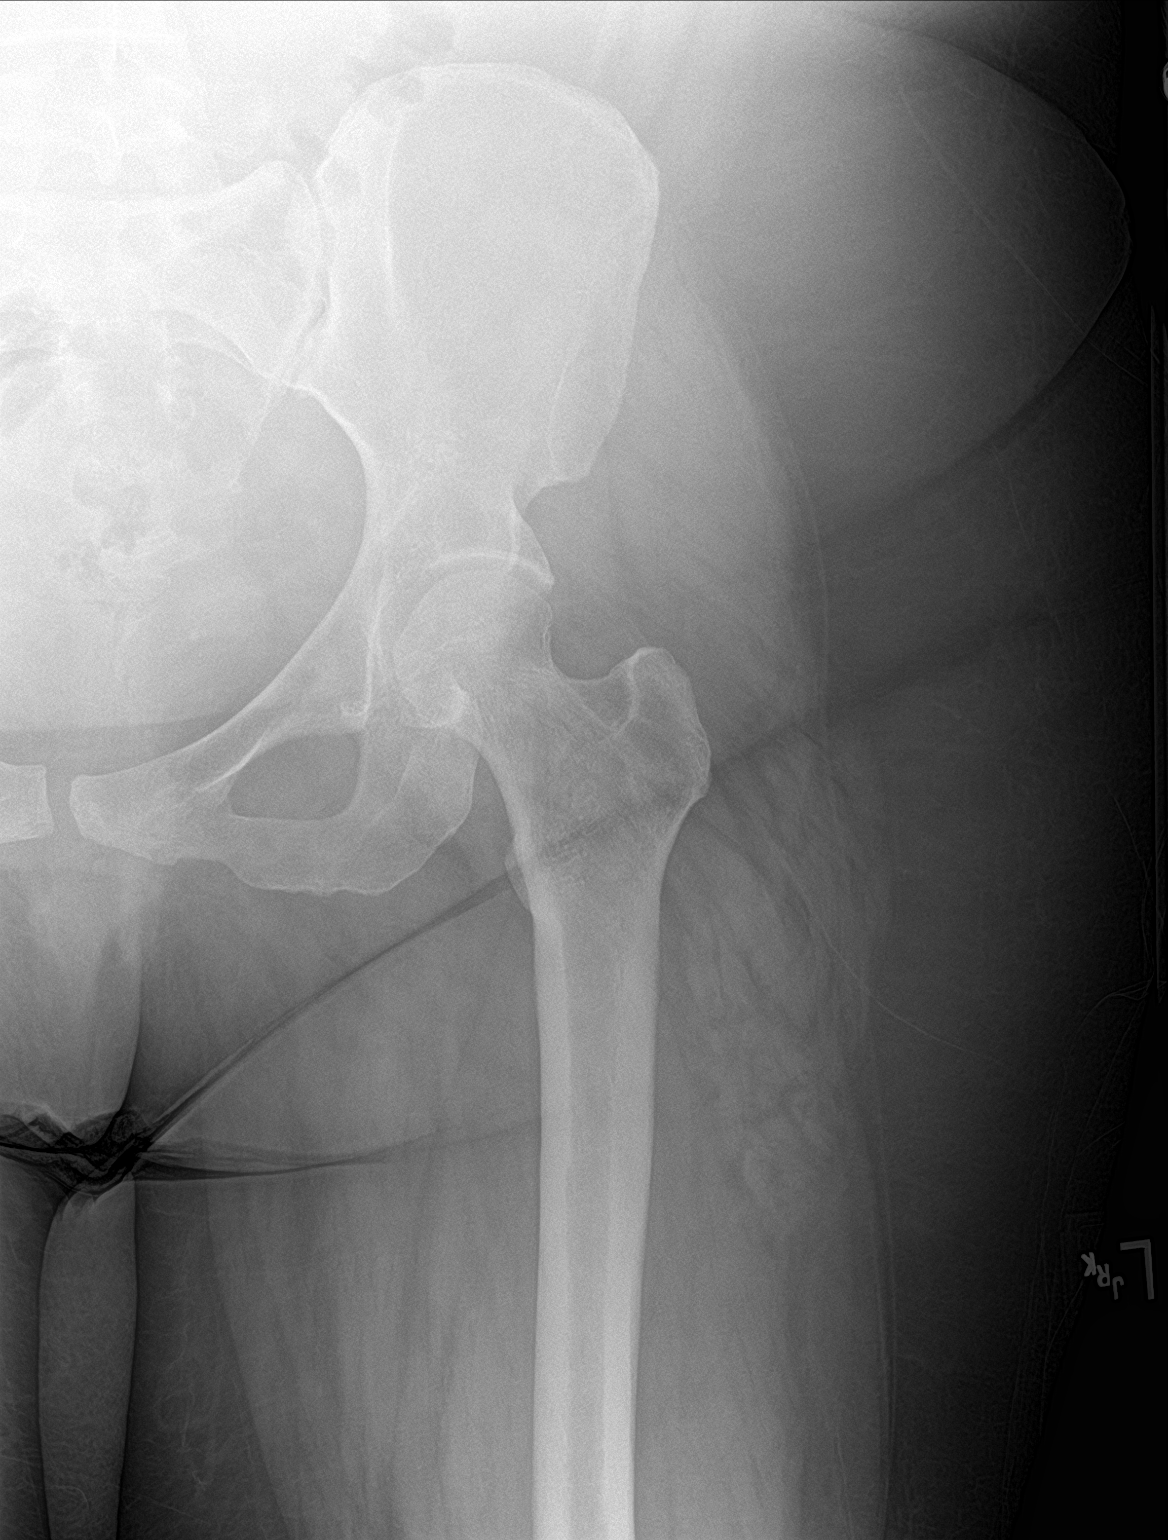

[hip lat]
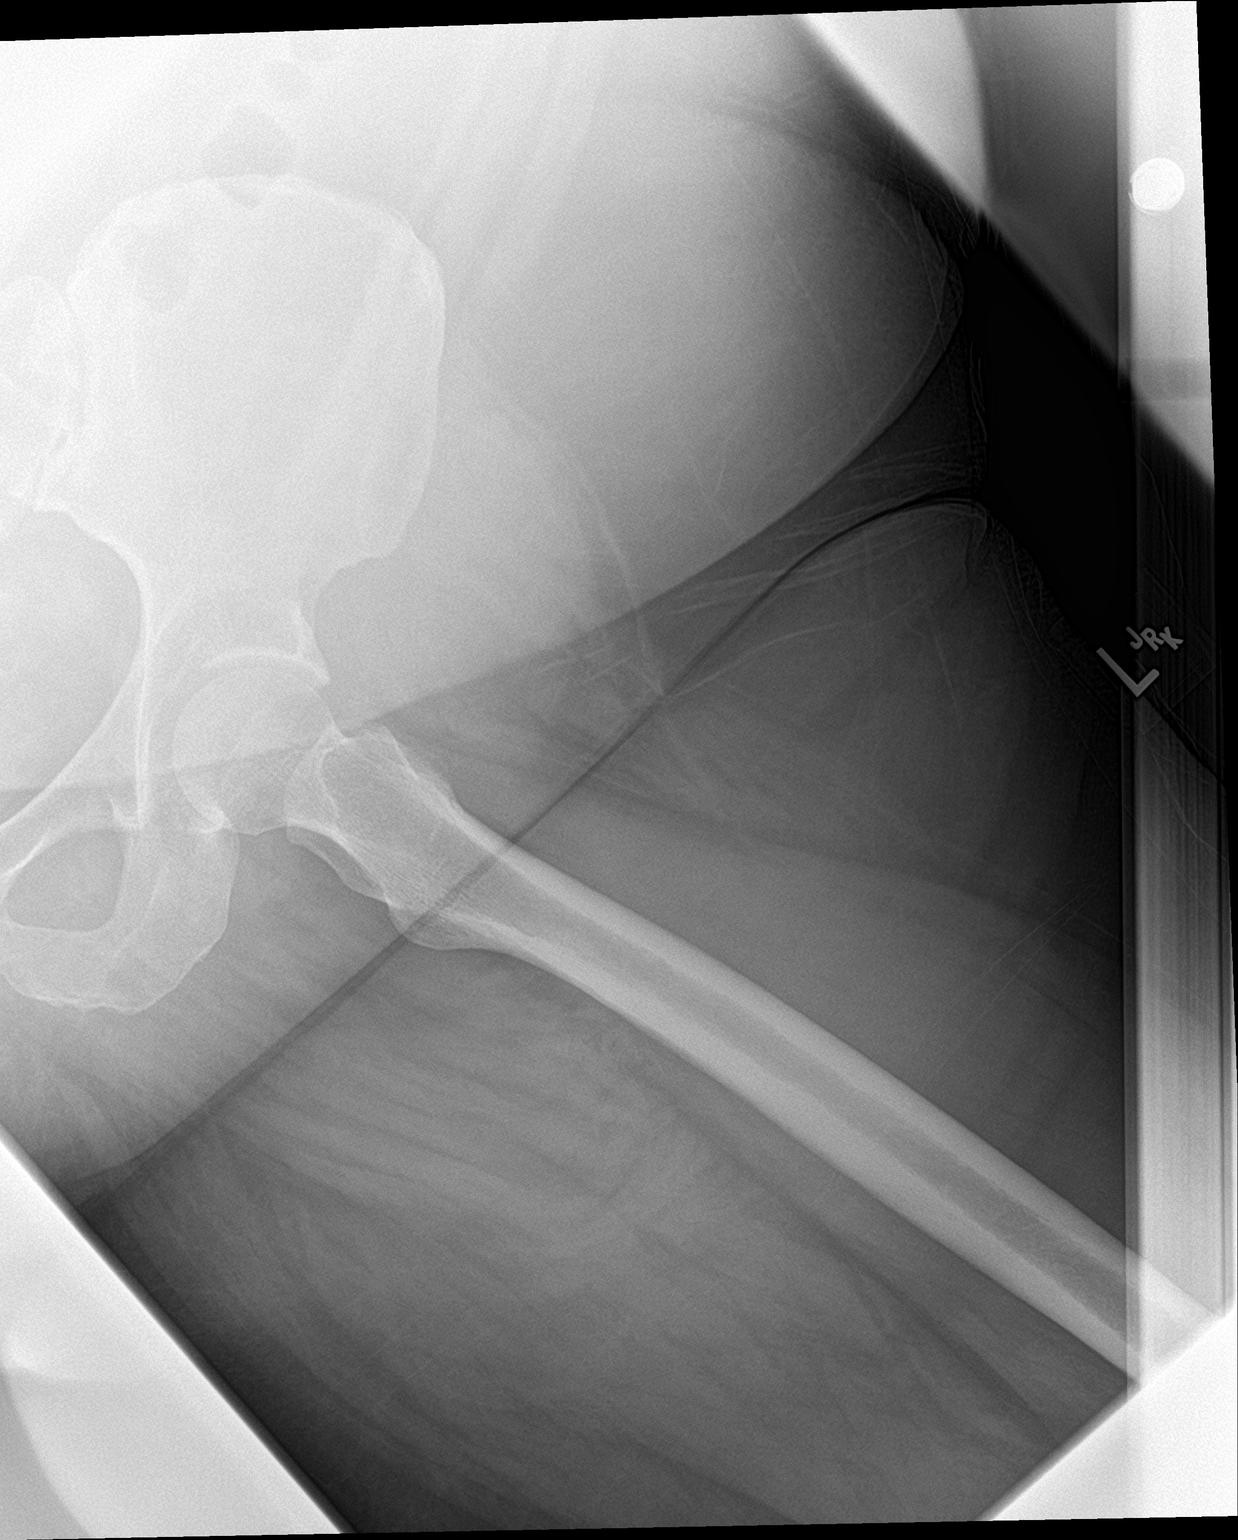

[3 of 3 positions shown; findings below may reference images not displayed]

FINDINGS: There is no evidence of hip fracture or dislocation. There is no
evidence of arthropathy or other focal bone abnormality.
IMPRESSION: Negative.

## 2017-07-09 ENCOUNTER — Encounter: Payer: Self-pay | Admitting: Family Medicine

## 2017-07-09 ENCOUNTER — Other Ambulatory Visit: Payer: Self-pay | Admitting: Family Medicine

## 2017-07-09 ENCOUNTER — Ambulatory Visit (INDEPENDENT_AMBULATORY_CARE_PROVIDER_SITE_OTHER): Payer: 59 | Admitting: Family Medicine

## 2017-07-09 VITALS — BP 140/87 | HR 86 | Temp 98.2°F | Resp 16 | Ht 60.0 in | Wt 240.0 lb

## 2017-07-09 DIAGNOSIS — I10 Essential (primary) hypertension: Secondary | ICD-10-CM

## 2017-07-09 DIAGNOSIS — Z0000 Encounter for general adult medical examination without abnormal findings: Secondary | ICD-10-CM

## 2017-07-09 DIAGNOSIS — R7309 Other abnormal glucose: Secondary | ICD-10-CM

## 2017-07-09 DIAGNOSIS — R0609 Other forms of dyspnea: Secondary | ICD-10-CM | POA: Diagnosis not present

## 2017-07-09 DIAGNOSIS — R600 Localized edema: Secondary | ICD-10-CM | POA: Diagnosis not present

## 2017-07-09 DIAGNOSIS — D649 Anemia, unspecified: Secondary | ICD-10-CM | POA: Insufficient documentation

## 2017-07-09 MED ORDER — HYDROCHLOROTHIAZIDE 25 MG PO TABS: 25.0000 mg | ORAL_TABLET | Freq: Every day | ORAL | 3 refills | Status: DC

## 2017-07-09 NOTE — Progress Notes (Signed)
Subjective:    Patient ID: Stacey [facility], female    DOB: [birth year], 36 y.o.   MRN: [id number]  Stacey [facility] is a 36 y.o. female presenting on 07/09/2017 for Leg Swelling  Initially scheduled for annual physical, however she has significant acute complaints and did not have labs done ahead of time, decision to switch to regular office visit for today, and re-schedule physical.  HPI   No labs recently.  Lower Extremity Edema / Dyspnea with Exertion / Morbid Obesity BMI >46 - Reports concerns with leg swelling and dyspnea that have been present for a while, now concerning her enough for evaluation. States that she has always had chronic LE edema, even >10-20 years ago in childhood would often get some swelling of lower legs at times, she attributed this to her weight and obesity, among other factors. 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 - Never treated with medications, compression, only elevates small amount at night and laying down to sleep - Admits rarely has had hand swelling - Does not drink soda. Drinks one cup coffee in AM, drinks mostly water, especially during summer - Also admits concern with feeling "short of breath" and "winded" and "feels like something sitting on me" with chest tightness if walks up more than 1 flight of stairs, no regular exercise so admits deconditioning, but concern that this seems worse more recently - She was told had heart murmur previously - History of pregnancy x 5 - States she had ECHOcardiogram >14 years ago, does not recall result was told normal, and no recent Cardiology evaluation - Feels "bloated" by end of day - Denies any active chest pain or pressure, palpitations, rapid heart rate, dyspnea  CHRONIC HTN, New Dx Reports that she  has had elevated BP for few years ago, never been dx with HTN, never on meds Current Meds - None   - Admits significant amount of stress with work and family life, she attributes most of her elevated BP to this - Not limiting salt intake Admits edema Denies HA, dizziness / lightheadedness  Review of Systems Per HPI unless specifically indicated above     Objective:    BP 140/87   Pulse 86   Temp 98.2 F (36.8 C) (Oral)   Resp 16   Ht 5' (1.524 m)   Wt 240 lb (108.9 kg)   BMI 46.87 kg/m   Wt Readings from Last 3 Encounters:  07/09/17 240 lb (108.9 kg)  06/30/17 240 lb (108.9 kg)  09/22/16 238 lb (108 kg)    Physical Exam  Constitutional: She is oriented to person, place, and time. She appears well-developed and well-nourished. No distress.  Well-appearing, comfortable, cooperative  HENT:  Head: Normocephalic and atraumatic.  Mouth/Throat: Oropharynx is clear and moist.  Eyes: Conjunctivae are normal. Right eye exhibits no discharge. Left eye exhibits no discharge.  Cardiovascular: Normal rate, regular rhythm, normal heart sounds and intact distal pulses.   No murmur heard. Do not appreciate heart murmur or ectopy  Pulmonary/Chest: Effort normal and breath sounds normal. No respiratory distress. She has no wheezes. She has no rales.  Musculoskeletal: Normal range of motion. She exhibits edema (bilateral lower extremity non pitting edema, symmetrical).  Neurological: She is alert and oriented to person, place, and time.  Skin: Skin is warm and dry. No rash noted. She is not diaphoretic. No erythema.  Psychiatric: She has a normal mood and affect. Her behavior is normal.  Well groomed, good eye contact, normal speech and thoughts. Mildly anxious appearing  Nursing note and vitals reviewed.  Results for orders placed or performed in visit on 06/30/17  POCT rapid strep A  Result Value Ref Range   Rapid Strep A Screen Negative Negative      Assessment & Plan:   Problem  List Items Addressed This Visit    Obesity, Class III, BMI 40-49.9 (morbid obesity) (HCC)    Abnormal weight, limited wt loss, poor lifestyle BMI >46 Encourage improved diet and exercise will help control HTN, help reduce edema Follow-up in future with other options once medically stable, consider MNT referral, Wt Management referral, Bariatrics and other options vs medical options wt loss      Essential hypertension - Primary    New dx today, with review of chronic elevated BP >140/90 on average No outside BP readings No known complications - long-overdue for labs and other diagnostics  Plan:  1. Start HCTZ 12.5mg  daily - (half of 25mg  tabs), may increase 1-2 weeks to whole tab if needed for BP and swelling 2. Encourage improved lifestyle - low sodium diet, regular exercise 3. Start monitor BP outside office, bring readings to next visit, if persistently >140/90 or new symptoms notify office sooner 4. Follow-up 2 weeks for annual physical + labs, check after taking thiazide      Relevant Medications   hydrochlorothiazide (HYDRODIURIL) 25 MG tablet   Other Relevant Orders   ECHOCARDIOGRAM COMPLETE   Dyspnea on exertion    Concern with gradual worsening problem, likely multifactorial with morbid obesity and deconditioning, concern for potential cardiac etiology with leg swelling as well. - Check ECHO, labs - Pending results will refer to Cardiology most likely as discussed - Strict return criteria when to go to hospital ED if concern chest pain or pressure      Relevant Orders   ECHOCARDIOGRAM COMPLETE   Bilateral lower extremity edema    Subacute worsening on chronic b/l LE edema, non pitting, situational worse prolonged standing. Most likely multifactorial with several provoking factors including morbid obesity, diet, hydration, lifestyle. Likely some component of venous insufficiency. No asymmetry or other provoking factor to suggest DVT, low risk Well's score. - Concern with DOE  and multiple prior pregnancies consider cardiac function related  Plan: 1. Order [facility] at [facility], to be scheduled, and follow-up result - likely will refer to Cardiology for other symptoms 2. Check labs in 1 week before annual, for other etiologies of edema and routine labs 3. Start HCTZ for HTN as well 4. Rx handwritten for Compression Stockings 15-25 mmHg pressure, adjust as needed 5. RICE therapy, improve elevation 6. Consider referral to Vascular in future if need vs checking non invasive doppler for reflux      Relevant Medications   hydrochlorothiazide (HYDRODIURIL) 25 MG tablet   Other Relevant Orders   ECHOCARDIOGRAM COMPLETE      Meds ordered this encounter  Medications  . hydrochlorothiazide (HYDRODIURIL) 25 MG tablet    Sig: Take 1 tablet (25 mg total) by mouth daily. Start with half tablet dose 12.5mg  daily, if needed increase to full dose daily    Dispense:  30 tablet    Refill:  3    Follow up plan: Return in about 2 weeks (around 07/23/2017) for Annual Physical.  Nobie Putnam, DO  [facility] 07/09/2017, 3:33 PM

## 2017-07-09 NOTE — Assessment & Plan Note (Signed)
Abnormal weight, limited wt loss, poor lifestyle BMI >46 Encourage improved diet and exercise will help control HTN, help reduce edema Follow-up in future with other options once medically stable, consider MNT referral, Wt Management referral, Bariatrics and other options vs medical options wt loss

## 2017-07-09 NOTE — Assessment & Plan Note (Signed)
Concern with gradual worsening problem, likely multifactorial with morbid obesity and deconditioning, concern for potential cardiac etiology with leg swelling as well. - Check ECHO, labs - Pending results will refer to Cardiology most likely as discussed - Strict return criteria when to go to hospital ED if concern chest pain or pressure

## 2017-07-09 NOTE — Patient Instructions (Addendum)
Thank you for coming to the clinic today.  1. Start new BP pill HCTZ 12.5mg  (half tab) then increase to whole tab in future if needed  ORDERED ECHOcardiogram, to be scheduled.  Then most likely will also refer to Cardiology  DUE for FASTING BLOOD WORK (no food or drink after midnight before the lab appointment, only water or coffee without cream/sugar on the morning of)  SCHEDULE "Lab Only" visit in the morning at the clinic for lab draw in 1 WEEK  - Make sure Lab Only appointment is at about 1 week before your next appointment, so that results will be available  For Lab Results, once available within 2-3 days of blood draw, you can can log in to MyChart online to view your results and a brief explanation. Also, we can discuss results at next follow-up visit.   Please schedule a Follow-up Appointment to: Return in about 2 weeks (around 07/23/2017) for Annual Physical.  If you have any other questions or concerns, please feel free to call the clinic or send a message through [place]. You may also schedule an earlier appointment if necessary.  Additionally, you may be receiving a survey about your experience at our clinic within a few days to 1 week by e-mail or mail. We value your feedback.  Nobie Putnam, DO [place]

## 2017-07-09 NOTE — Assessment & Plan Note (Signed)
New dx today, with review of chronic elevated BP >140/90 on average No outside BP readings No known complications - long-overdue for labs and other diagnostics  Plan:  1. Start HCTZ 12.5mg  daily - (half of 25mg  tabs), may increase 1-2 weeks to whole tab if needed for BP and swelling 2. Encourage improved lifestyle - low sodium diet, regular exercise 3. Start monitor BP outside office, bring readings to next visit, if persistently >140/90 or new symptoms notify office sooner 4. Follow-up 2 weeks for annual physical + labs, check after taking thiazide

## 2017-07-09 NOTE — Assessment & Plan Note (Signed)
Subacute worsening on chronic b/l LE edema, non pitting, situational worse prolonged standing. Most likely multifactorial with several provoking factors including morbid obesity, diet, hydration, lifestyle. Likely some component of venous insufficiency. No asymmetry or other provoking factor to suggest DVT, low risk Well's score. - Concern with DOE and multiple prior pregnancies consider cardiac function related  Plan: 1. Order ECHOcardiogram at [facility], to be scheduled, and follow-up result - likely will refer to Cardiology for other symptoms 2. Check labs in 1 week before annual, for other etiologies of edema and routine labs 3. Start HCTZ for HTN as well 4. Rx handwritten for Compression Stockings 15-25 mmHg pressure, adjust as needed 5. RICE therapy, improve elevation 6. Consider referral to Vascular in future if need vs checking non invasive doppler for reflux

## 2017-07-13 ENCOUNTER — Telehealth: Payer: Self-pay

## 2017-07-13 NOTE — Telephone Encounter (Addendum)
Called pt and left detail message for appt for echocardiogram on 09/12  at 12:00 pm needed to  Arrive 30 min early than appt time.

## 2017-07-15 ENCOUNTER — Ambulatory Visit: Payer: 59 | Attending: Family Medicine

## 2017-07-23 ENCOUNTER — Other Ambulatory Visit: Payer: Self-pay

## 2017-07-23 ENCOUNTER — Other Ambulatory Visit: Payer: 59

## 2017-07-23 DIAGNOSIS — R0609 Other forms of dyspnea: Secondary | ICD-10-CM

## 2017-07-23 DIAGNOSIS — R600 Localized edema: Secondary | ICD-10-CM

## 2017-07-23 DIAGNOSIS — I10 Essential (primary) hypertension: Secondary | ICD-10-CM

## 2017-07-23 DIAGNOSIS — D649 Anemia, unspecified: Secondary | ICD-10-CM

## 2017-07-23 DIAGNOSIS — Z0000 Encounter for general adult medical examination without abnormal findings: Secondary | ICD-10-CM

## 2017-07-23 DIAGNOSIS — R7309 Other abnormal glucose: Secondary | ICD-10-CM

## 2017-07-24 LAB — CBC WITH DIFFERENTIAL/PLATELET
BASOS PCT: 0.6 %
Basophils Absolute: 42 cells/uL (ref 0–200)
EOS ABS: 91 {cells}/uL (ref 15–500)
Eosinophils Relative: 1.3 %
HEMATOCRIT: 35.3 % (ref 35.0–45.0)
Hemoglobin: 11.7 g/dL (ref 11.7–15.5)
LYMPHS ABS: 2254 {cells}/uL (ref 850–3900)
MCH: 28 pg (ref 27.0–33.0)
MCHC: 33.1 g/dL (ref 32.0–36.0)
MCV: 84.4 fL (ref 80.0–100.0)
MPV: 8.8 fL (ref 7.5–12.5)
Monocytes Relative: 5.4 %
NEUTROS PCT: 60.5 %
Neutro Abs: 4235 cells/uL (ref 1500–7800)
PLATELETS: 395 10*3/uL (ref 140–400)
RBC: 4.18 10*6/uL (ref 3.80–5.10)
RDW: 13.1 % (ref 11.0–15.0)
TOTAL LYMPHOCYTE: 32.2 %
WBC: 7 10*3/uL (ref 3.8–10.8)
WBCMIX: 378 {cells}/uL (ref 200–950)

## 2017-07-24 LAB — COMPLETE METABOLIC PANEL WITH GFR
AG Ratio: 0.9 (calc) — ABNORMAL LOW (ref 1.0–2.5)
ALT: 11 U/L (ref 6–29)
AST: 13 U/L (ref 10–30)
Albumin: 3.4 g/dL — ABNORMAL LOW (ref 3.6–5.1)
Alkaline phosphatase (APISO): 55 U/L (ref 33–115)
BUN: 9 mg/dL (ref 7–25)
CALCIUM: 8.6 mg/dL (ref 8.6–10.2)
CO2: 27 mmol/L (ref 20–32)
Chloride: 107 mmol/L (ref 98–110)
Creat: 0.54 mg/dL (ref 0.50–1.10)
GFR, EST AFRICAN AMERICAN: 142 mL/min/{1.73_m2} (ref >=60)
GFR, EST NON AFRICAN AMERICAN: 122 mL/min/{1.73_m2} (ref >=60)
GLUCOSE: 97 mg/dL (ref 65–99)
Globulin: 3.6 g/dL (calc) (ref 1.9–3.7)
Potassium: 3.8 mmol/L (ref 3.5–5.3)
Sodium: 142 mmol/L (ref 135–146)
Total Bilirubin: 0.3 mg/dL (ref 0.2–1.2)
Total Protein: 7 g/dL (ref 6.1–8.1)

## 2017-07-24 LAB — LIPID PANEL
CHOLESTEROL: 161 mg/dL (ref <200)
HDL: 51 mg/dL (ref >50)
LDL Cholesterol (Calc): 94 mg/dL (calc)
Non-HDL Cholesterol (Calc): 110 mg/dL (calc) (ref <130)
TRIGLYCERIDES: 74 mg/dL (ref <150)
Total CHOL/HDL Ratio: 3.2 (calc) (ref <5.0)

## 2017-07-24 LAB — HEMOGLOBIN A1C
EAG (MMOL/L): 6 (calc)
Hgb A1c MFr Bld: 5.4 % of total Hgb (ref <5.7)
MEAN PLASMA GLUCOSE: 108 (calc)

## 2017-07-24 LAB — TSH: TSH: 3.23 mIU/L

## 2017-07-24 LAB — BRAIN NATRIURETIC PEPTIDE: Brain Natriuretic Peptide: 18 pg/mL (ref <100)

## 2017-07-30 ENCOUNTER — Ambulatory Visit (INDEPENDENT_AMBULATORY_CARE_PROVIDER_SITE_OTHER): Payer: 59 | Admitting: Family Medicine

## 2017-07-30 ENCOUNTER — Encounter: Payer: Self-pay | Admitting: Family Medicine

## 2017-07-30 VITALS — BP 142/90 | HR 80 | Temp 98.2°F | Resp 16 | Ht 60.0 in | Wt 240.6 lb

## 2017-07-30 DIAGNOSIS — R600 Localized edema: Secondary | ICD-10-CM

## 2017-07-30 DIAGNOSIS — Z23 Encounter for immunization: Secondary | ICD-10-CM | POA: Diagnosis not present

## 2017-07-30 DIAGNOSIS — I10 Essential (primary) hypertension: Secondary | ICD-10-CM

## 2017-07-30 DIAGNOSIS — Z0000 Encounter for general adult medical examination without abnormal findings: Secondary | ICD-10-CM

## 2017-07-30 DIAGNOSIS — M5442 Lumbago with sciatica, left side: Secondary | ICD-10-CM

## 2017-07-30 DIAGNOSIS — G8929 Other chronic pain: Secondary | ICD-10-CM

## 2017-07-30 MED ORDER — CYCLOBENZAPRINE HCL 5 MG PO TABS: ORAL_TABLET | ORAL | 2 refills | Status: DC

## 2017-07-30 NOTE — Assessment & Plan Note (Signed)
Chronic weight problem, prior abnormal wt gain and difficulty losing wt, especially postpartum x 3. Other factors with poor lifestyle limited regular exercise BMI remains >46 Continue to encourage healthy lifestyle changes diet / exercise Already completed nutrition therapy in past, limited other results Not interested in medications or surgery Proceed with referral to more medical management with [facility] Dr Dennard Nip, future consider bariatric if needed

## 2017-07-30 NOTE — Assessment & Plan Note (Signed)
Stable currently without worsening - Chronic problem, unchanged situational worse prolonged standing. Most likely multifactorial with several provoking factors including morbid obesity, diet, hydration, lifestyle. Likely some component of venous insufficiency. - Concern with DOE and multiple prior pregnancies consider cardiac function related - Normal labs chemistry, BNP 18, negative screening for potential CHF, seems less likely  Plan: 1. Still needs to schedule ECHOcardiogram at [facility] follow-up result - likely will refer to Cardiology for other symptoms persistent 2. Start HCTZ for HTN as well - may help swelling 3. Use rx Compression Stockings 15-25 mmHg pressure, adjust as needed 4. RICE therapy, improve elevation 5. Future consider referral to Vascular in future if need vs checking non invasive doppler for reflux

## 2017-07-30 NOTE — Progress Notes (Signed)
Subjective:    Patient ID: Stacey Taylor, female    DOB: 05/19/1981, 36 y.o.   MRN: [id number]  Stacey Taylor is a 36 y.o. female presenting on 07/30/2017 for Annual Exam   HPI   Here for Annual Physical and Lab Review  Morbid Obesity BMI >46 / Abnormal Weight Gain - Reviews chronic history of problem with weight gain, seemed to be triggered by prior pregnancies x 3, unable to lose weight. Also genetic component with family history sister weighed up to 400 lbs, but then had gastric bypass now < 200 lbs. She reports long history of difficulty losing wt with diet and nutrition, has been to nutritionist before. Does admit to limited exercise, but often stays active with walking on job. - She is not interested in medications for weight loss. Also not ready to consider surgical options at this time, despite family members advising her to do this before - Interested for further weight management  Chronic LE Edema / History of Dyspnea with Exertion - Last visit with me 07/09/17, for initial eval of same problem, treated with started HCTZ for HTN and edema, ordered labs and ECHO, see prior notes for background information. - Interval update with labs unremarkable, normal A1c, normal BNP, improved Hgb without significant anemia, normal TSH - Today patient reports that she has not had ECHO done yet, due to scheduling conflict, still plans to get this scheduled soon. Also has not started HCTZ yet, picked up and has rx but not started  CHRONIC HTN Not started HCTZ yet, just picked up med, admits difficulty with time getting to pharmacy Current Meds - HCTZ 12.5mg  (Half of 25mg  tab) - not started yet - Admits significant amount of stress with work and family life, she attributes most of her elevated BP to this - Not limiting salt intake - Additionally stated she had lower BP during pregnancy in past, and then it worsened after  Additional PMH - Endometriosis, s/p tubal ligation  Health  Maintenance: - Due for Flu Shot, will receive today  Depression screen [facility] 2/9 07/30/2017 07/09/2017 11/29/2015  Decreased Interest 0 0 0  Down, Depressed, Hopeless 0 0 0  PHQ - 2 Score 0 0 0    No past medical history on file. Past Surgical History:  Procedure Laterality Date  . CESAREAN SECTION  2008/2015  . CHOLECYSTECTOMY    . CORONARY ARTERY BYPASS GRAFT     Social History   Social History  . Marital status: Married    Spouse name: N/A  . Number of children: N/A  . Years of education: N/A   Occupational History  . Not on file.   Social History Main Topics  . Smoking status: Never Smoker  . Smokeless tobacco: Never Used  . Alcohol use No  . Drug use: No  . Sexual activity: Not on file   Other Topics Concern  . Not on file   Social History Narrative  . No narrative on file   Family History  Problem Relation Age of Onset  . Adopted: Yes  . Family history unknown: Yes   Current Outpatient Prescriptions on File Prior to Visit  Medication Sig  . hydrochlorothiazide (HYDRODIURIL) 25 MG tablet Take 1 tablet (25 mg total) by mouth daily. Start with half tablet dose 12.5mg  daily, if needed increase to full dose daily  . LO LOESTRIN FE 1 MG-10 MCG / 10 MCG tablet   . fluticasone (FLONASE) 50 MCG/ACT nasal spray   . ibuprofen (  ADVIL,MOTRIN) 800 MG tablet ibuprofen 800 mg tablet  . mometasone (NASONEX) 50 MCG/ACT nasal spray Place 2 sprays into the nose daily. (Patient taking differently: Place 2 sprays into the nose as needed. )   No current facility-administered medications on file prior to visit.     Review of Systems  Constitutional: Negative for activity change, appetite change, chills, diaphoresis, fatigue and fever.  HENT: Negative for congestion, hearing loss and sinus pressure.   Eyes: Negative for visual disturbance.  Respiratory: Negative for apnea, cough, chest tightness, shortness of breath and wheezing.   Cardiovascular: Positive for leg swelling.  Negative for chest pain and palpitations.  Gastrointestinal: Negative for abdominal pain, anal bleeding, blood in stool, constipation, diarrhea, nausea and vomiting.  Endocrine: Negative for cold intolerance and polyuria.  Genitourinary: Negative for difficulty urinating, dysuria, frequency and hematuria.  Musculoskeletal: Positive for arthralgias and back pain. Negative for neck pain.  Skin: Negative for rash.  Allergic/Immunologic: Negative for environmental allergies.  Neurological: Negative for dizziness, weakness, light-headedness, numbness and headaches.  Hematological: Negative for adenopathy.  Psychiatric/Behavioral: Negative for behavioral problems, dysphoric mood and sleep disturbance. The patient is not nervous/anxious.    Per HPI unless specifically indicated above     Objective:    BP (!) 142/90   Pulse 80   Temp 98.2 F (36.8 C) (Oral)   Resp 16   Ht 5' (1.524 m)   Wt 240 lb 9.6 oz (109.1 kg)   BMI 46.99 kg/m   Wt Readings from Last 3 Encounters:  07/30/17 240 lb 9.6 oz (109.1 kg)  07/09/17 240 lb (108.9 kg)  06/30/17 240 lb (108.9 kg)    Physical Exam  Constitutional: She is oriented to person, place, and time. She appears well-developed and well-nourished. No distress.  Well-appearing, comfortable, cooperative, obese  HENT:  Head: Normocephalic and atraumatic.  Mouth/Throat: Oropharynx is clear and moist.  Frontal / maxillary sinuses non-tender. Nares patent without purulence or edema. Bilateral TMs clear without erythema, effusion or bulging. Oropharynx clear without erythema, exudates, edema or asymmetry.  Eyes: Pupils are equal, round, and reactive to light. Conjunctivae and EOM are normal. Right eye exhibits no discharge. Left eye exhibits no discharge.  Neck: Normal range of motion. Neck supple. No thyromegaly present.  Cardiovascular: Normal rate, regular rhythm, normal heart sounds and intact distal pulses.   No murmur heard. Pulmonary/Chest: Effort  normal and breath sounds normal. No respiratory distress. She has no wheezes. She has no rales.  Abdominal: Soft. Bowel sounds are normal. She exhibits no distension and no mass. There is no tenderness.  Musculoskeletal: Normal range of motion. She exhibits edema (non pitting edema bilateral LE stable). She exhibits no tenderness.  Upper / Lower Extremities: - Normal muscle tone, strength bilateral upper extremities 5/5, lower extremities 5/5  Lymphadenopathy:    She has no cervical adenopathy.  Neurological: She is alert and oriented to person, place, and time.  Distal sensation intact to light touch all extremities  Skin: Skin is warm and dry. No rash noted. She is not diaphoretic. No erythema.  Psychiatric: She has a normal mood and affect. Her behavior is normal.  Well groomed, good eye contact, normal speech and thoughts  Nursing note and vitals reviewed.  Results for orders placed or performed in visit on 07/23/17  COMPLETE METABOLIC PANEL WITH GFR  Result Value Ref Range   Glucose, Bld 97 65 - 99 mg/dL   BUN 9 7 - 25 mg/dL   Creat 0.54 0.50 - 1.10  mg/dL   GFR, Est Non African American 122 > OR = 60 mL/min/1.46m2   GFR, Est African American 142 > OR = 60 mL/min/1.54m2   BUN/Creatinine Ratio NOT APPLICABLE 6 - 22 (calc)   Sodium 142 135 - 146 mmol/L   Potassium 3.8 3.5 - 5.3 mmol/L   Chloride 107 98 - 110 mmol/L   CO2 27 20 - 32 mmol/L   Calcium 8.6 8.6 - 10.2 mg/dL   Total Protein 7.0 6.1 - 8.1 g/dL   Albumin 3.4 (L) 3.6 - 5.1 g/dL   Globulin 3.6 1.9 - 3.7 g/dL (calc)   AG Ratio 0.9 (L) 1.0 - 2.5 (calc)   Total Bilirubin 0.3 0.2 - 1.2 mg/dL   Alkaline phosphatase (APISO) 55 33 - 115 U/L   AST 13 10 - 30 U/L   ALT 11 6 - 29 U/L  Hemoglobin A1c  Result Value Ref Range   Hgb A1c MFr Bld 5.4 <5.7 % of total Hgb   Mean Plasma Glucose 108 (calc)   eAG (mmol/L) 6.0 (calc)  Lipid panel  Result Value Ref Range   Cholesterol 161 <200 mg/dL   HDL 51 >50 mg/dL   Triglycerides  74 <150 mg/dL   LDL Cholesterol (Calc) 94 mg/dL (calc)   Total CHOL/HDL Ratio 3.2 <5.0 (calc)   Non-HDL Cholesterol (Calc) 110 <130 mg/dL (calc)  CBC with Differential/Platelet  Result Value Ref Range   WBC 7.0 3.8 - 10.8 Thousand/uL   RBC 4.18 3.80 - 5.10 Million/uL   Hemoglobin 11.7 11.7 - 15.5 g/dL   HCT 35.3 35.0 - 45.0 %   MCV 84.4 80.0 - 100.0 fL   MCH 28.0 27.0 - 33.0 pg   MCHC 33.1 32.0 - 36.0 g/dL   RDW 13.1 11.0 - 15.0 %   Platelets 395 140 - 400 Thousand/uL   MPV 8.8 7.5 - 12.5 fL   Neutro Abs 4,235 1,500 - 7,800 cells/uL   Lymphs Abs 2,254 850 - 3,900 cells/uL   WBC mixed population 378 200 - 950 cells/uL   Eosinophils Absolute 91 15 - 500 cells/uL   Basophils Absolute 42 0 - 200 cells/uL   Neutrophils Relative % 60.5 %   Total Lymphocyte 32.2 %   Monocytes Relative 5.4 %   Eosinophils Relative 1.3 %   Basophils Relative 0.6 %  Brain natriuretic peptide  Result Value Ref Range   Brain Natriuretic Peptide 18 <100 pg/mL  TSH  Result Value Ref Range   TSH 3.23 mIU/L      Assessment & Plan:   Problem List Items Addressed This Visit    Bilateral lower extremity edema    Stable currently without worsening - Chronic problem, unchanged situational worse prolonged standing. Most likely multifactorial with several provoking factors including morbid obesity, diet, hydration, lifestyle. Likely some component of venous insufficiency. - Concern with DOE and multiple prior pregnancies consider cardiac function related - Normal labs chemistry, BNP 18, negative screening for potential CHF, seems less likely  Plan: 1. Still needs to [facility], follow-up result - likely will refer to Cardiology for other symptoms persistent 2. Start HCTZ for HTN as well - may help swelling 3. Use rx Compression Stockings 15-25 mmHg pressure, adjust as needed 4. RICE therapy, improve elevation 5. Future consider referral to Vascular in future if need vs checking non  invasive doppler for reflux      Chronic low back pain with left-sided sciatica    Improved Refill Flexeril PRN  Relevant Medications   cyclobenzaprine (FLEXERIL) 5 MG tablet   Essential hypertension    Uncontrolled, never started med, non adherence x 1 month No outside BP readings No known complications  Plan:  1. Still needs to start HCTZ 12.5mg  daily - (half of 25mg  tabs), may increase 1-2 weeks to whole tab if needed for BP and swelling 2. Encourage improved lifestyle - low sodium diet, regular exercise 3. Start monitor BP outside office, bring readings to next visit, if persistently >140/90 or new symptoms notify office sooner 4. Follow-up 3 months HTN - sooner if needed      Relevant Orders   Amb Ref to Medical Weight Management   Obesity, Class III, BMI 40-49.9 (morbid obesity) (HCC)    Chronic weight problem, prior abnormal wt gain and difficulty losing wt, especially postpartum x 3. Other factors with poor lifestyle limited regular exercise BMI remains >46 Continue to encourage healthy lifestyle changes diet / exercise Already completed nutrition therapy in past, limited other results Not interested in medications or surgery Proceed with referral to more medical management with [facility] Dr Dennard Nip, future consider bariatric if needed      Relevant Orders   Amb Ref to Medical Weight Management    Other Visit Diagnoses    Annual physical exam    -  Primary   Needs flu shot       Relevant Orders   Flu Vaccine QUAD 6+ mos PF IM (Fluarix Quad PF) (Completed)      Meds ordered this encounter  Medications  . cyclobenzaprine (FLEXERIL) 5 MG tablet    Sig: 1 tablet every 8 hours as needed for muscle spasms    Dispense:  30 tablet    Refill:  2    Follow up plan: Return in about 3 months (around 10/29/2017) for HTN, ECHO results, Weight Check.  Nobie Putnam, [facility] 07/30/2017, 5:02 PM

## 2017-07-30 NOTE — Patient Instructions (Addendum)
Thank you for coming to the clinic today.  1. Go ahead and start HCTZ BP medication  Keep track of BP - 1-2 weeks, write down bring to office  SCHEDULE ECHO  WEIGHT MANAGEMENT  Dr Dennard Nip  [facility] [address] [place], [place] [address] Ph: [phone]  Consider Contrave in future for weight management  Please schedule a Follow-up Appointment to: Return in about 3 months (around 10/29/2017) for HTN, ECHO results, Weight Check.  If you have any other questions or concerns, please feel free to call the clinic or send a message through [place]. You may also schedule an earlier appointment if necessary.  Additionally, you may be receiving a survey about your experience at our clinic within a few days to 1 week by e-mail or mail. We value your feedback.  Nobie Putnam, DO [place]

## 2017-07-30 NOTE — Assessment & Plan Note (Signed)
Uncontrolled, never started med, non adherence x 1 month No outside BP readings No known complications  Plan:  1. Still needs to start HCTZ 12.5mg  daily - (half of 25mg  tabs), may increase 1-2 weeks to whole tab if needed for BP and swelling 2. Encourage improved lifestyle - low sodium diet, regular exercise 3. Start monitor BP outside office, bring readings to next visit, if persistently >140/90 or new symptoms notify office sooner 4. Follow-up 3 months HTN - sooner if needed

## 2017-07-30 NOTE — Assessment & Plan Note (Signed)
Improved Refill Flexeril PRN

## 2017-08-25 ENCOUNTER — Ambulatory Visit: Payer: Self-pay | Admitting: Physician Assistant

## 2017-08-25 ENCOUNTER — Encounter: Payer: Self-pay | Admitting: Physician Assistant

## 2017-08-25 VITALS — BP 136/90 | HR 85 | Temp 98.3°F

## 2017-08-25 DIAGNOSIS — J069 Acute upper respiratory infection, unspecified: Secondary | ICD-10-CM

## 2017-08-25 DIAGNOSIS — J029 Acute pharyngitis, unspecified: Secondary | ICD-10-CM

## 2017-08-25 LAB — POCT RAPID STREP A (OFFICE): Rapid Strep A Screen: NEGATIVE

## 2017-08-25 MED ORDER — AZITHROMYCIN 250 MG PO TABS: ORAL_TABLET | ORAL | 0 refills | Status: DC

## 2017-08-25 NOTE — Progress Notes (Signed)
S: C/o sore throat, cough and congestion for 3 days, no fever, chills,cp/sob, v/d; mucus was green this am but clear throughout the day, cough is sporadic,   Using otc meds:   O: PE: vitals wnl, nad perrl eomi, normocephalic, tms dull, nasal mucosa red and swollen, throat injected, neck supple no lymph, lungs c t a, cv rrr, neuro intact, q strrep neg  A:  Acute uri   P: drink fluids, continue regular meds , use otc meds of choice, return if not improving in 5 days, return earlier if worsening. zpack

## 2017-10-12 ENCOUNTER — Ambulatory Visit: Payer: Self-pay | Admitting: Certified Nurse Midwife

## 2017-10-23 ENCOUNTER — Telehealth: Payer: Self-pay

## 2017-10-23 MED ORDER — LO LOESTRIN FE 1 MG-10 MCG / 10 MCG PO TABS: 1.0000 | ORAL_TABLET | Freq: Every day | ORAL | 0 refills | Status: DC

## 2017-10-23 NOTE — Telephone Encounter (Signed)
Pt had apt for AE on Mon 10/12/17 (SNOW DAY) She was r/s to [facility] 10/28/17. Will need to start new pack of LO LOESTRIN FE 1 MG-10 MCG / 10 MCG tablet on Monday 10/26/17. Requesting refill. Cb#[phone]

## 2017-10-23 NOTE — Telephone Encounter (Signed)
RF sent. Pt aware.

## 2017-10-29 ENCOUNTER — Ambulatory Visit: Payer: 59 | Admitting: Family Medicine

## 2017-10-29 ENCOUNTER — Ambulatory Visit (INDEPENDENT_AMBULATORY_CARE_PROVIDER_SITE_OTHER): Payer: 59 | Admitting: Obstetrics and Gynecology

## 2017-10-29 ENCOUNTER — Encounter: Payer: Self-pay | Admitting: Obstetrics and Gynecology

## 2017-10-29 VITALS — BP 132/90 | Ht 60.0 in | Wt 245.0 lb

## 2017-10-29 DIAGNOSIS — Z01419 Encounter for gynecological examination (general) (routine) without abnormal findings: Secondary | ICD-10-CM | POA: Diagnosis not present

## 2017-10-29 MED ORDER — NORETHINDRONE ACETATE 5 MG PO TABS: 5.0000 mg | ORAL_TABLET | Freq: Every day | ORAL | 11 refills | Status: DC

## 2017-10-29 NOTE — Progress Notes (Signed)
Gynecology Annual Exam  PCP: Olin Hauser, DO  Chief Complaint:  Chief Complaint  Patient presents with  . Gynecologic Exam    irregular cycles    History of Present Illness: Patient is a 36 y.o. [id number] presents for annual exam. The patient has no complaints today.   LMP: Patient's last menstrual period was 10/22/2017. Good improvement in menstrual flow and dysmenorrhea on Lo Loestrin Fe but unpredictable withdrawal bleeds over the past few months. Heavy Menses: no Clots: no Postcoital Bleeding: no Dysmenorrhea: no  The patient is sexually active. She currently uses tubal ligation for contraception. She denies dyspareunia.  The patient does perform self breast exams.  There is no notable family history of breast or ovarian cancer in her family.  The patient wears seatbelts: yes.   The patient has regular exercise: not asked.    The patient denies current symptoms of depression.    Review of Systems: Review of Systems  Constitutional: Negative for chills and fever.  HENT: Negative for congestion.   Respiratory: Negative for cough and shortness of breath.   Cardiovascular: Negative for chest pain and palpitations.  Gastrointestinal: Negative for abdominal pain, constipation, diarrhea, heartburn, nausea and vomiting.  Genitourinary: Negative for dysuria, frequency and urgency.  Skin: Negative for itching and rash.  Neurological: Negative for dizziness and headaches.  Endo/Heme/Allergies: Negative for polydipsia.  Psychiatric/Behavioral: Negative for depression.    Past Medical History:  Past Medical History:  Diagnosis Date  . Abnormal Pap smear of anus    HGSIL  . Anemia   . Cervical dysplasia    CIN III  . Headache   . Heart murmur   . Obesity   . Premature delivery    x2    Past Surgical History:  Past Surgical History:  Procedure Laterality Date  . CERVICAL BIOPSY  W/ LOOP ELECTRODE EXCISION  11/2005  . CESAREAN SECTION  2008/2015  .  CESAREAN SECTION WITH BILATERAL TUBAL LIGATION  2015  . CHOLECYSTECTOMY  2003  . COLPOSCOPY  2005, 2006  . COMBINED HYSTEROSCOPY DIAGNOSTIC / D&C  2014  . CORONARY ARTERY BYPASS GRAFT      Gynecologic History:  Patient's last menstrual period was 10/22/2017. Contraception: tubal ligation Last Pap: Results were:01/26/2015 NIL and HR HPV negative   Obstetric History: [id number]  Family History:  Family History  Adopted: Yes  Family history unknown: Yes    Social History:  Social History   Socioeconomic History  . Marital status: Married    Spouse name: Not on file  . Number of children: Not on file  . Years of education: Not on file  . Highest education level: Not on file  Social Needs  . Financial resource strain: Not on file  . Food insecurity - worry: Not on file  . Food insecurity - inability: Not on file  . Transportation needs - medical: Not on file  . Transportation needs - non-medical: Not on file  Occupational History  . Not on file  Tobacco Use  . Smoking status: Never Smoker  . Smokeless tobacco: Never Used  Substance and Sexual Activity  . Alcohol use: No    Alcohol/week: 0.0 oz  . Drug use: No  . Sexual activity: Yes    Birth control/protection: Pill  Other Topics Concern  . Not on file  Social History Narrative  . Not on file    Allergies:  No Known Allergies  Medications: Prior to Admission medications   Medication Sig Start  Date End Date Taking? Authorizing Provider  cyclobenzaprine (FLEXERIL) 5 MG tablet 1 tablet every 8 hours as needed for muscle spasms 07/30/17  Yes Karamalegos, Alexander J, DO  LO LOESTRIN FE 1 MG-10 MCG / 10 MCG tablet Take 1 tablet by mouth daily for 28 days. 10/23/17 11/20/17 Yes Malachy Mood, MD    Physical Exam Vitals: Blood pressure 132/90, height 5' (1.524 m), weight 245 lb (111.1 kg), last menstrual period 10/22/2017. Body mass index is 47.85 kg/m.   General: NAD HEENT: normocephalic, anicteric Thyroid:  no enlargement, no palpable nodules Pulmonary: No increased work of breathing, CTAB Cardiovascular: RRR, distal pulses 2+ Breast: Breast symmetrical, no tenderness, no palpable nodules or masses, no skin or nipple retraction present, no nipple discharge.  No axillary or supraclavicular lymphadenopathy. Abdomen: NABS, soft, non-tender, non-distended.  Umbilicus without lesions.  No hepatomegaly, splenomegaly or masses palpable. No evidence of hernia  Genitourinary:  External: Normal external female genitalia.  Normal urethral meatus, normal  Bartholin's and Skene's glands.    Vagina: Normal vaginal mucosa, no evidence of prolapse.    Cervix: Grossly normal in appearance, no bleeding  Uterus: Non-enlarged, mobile, normal contour.  No CMT  Adnexa: ovaries non-enlarged, no adnexal masses  Rectal: deferred  Lymphatic: no evidence of inguinal lymphadenopathy Extremities: no edema, erythema, or tenderness Neurologic: Grossly intact Psychiatric: mood appropriate, affect full  Female chaperone present for pelvic and breast  portions of the physical exam    Assessment: 36 y.o. [id number] routine annual exam  Plan: Problem List Items Addressed This Visit    None      1) STI screening was not offered  2) ASCCP guidelines and rational discussed.  Patient opts for every 3 years screening interval  3) Contraception - s/p BTL on Lo Loestrin Fe for cycle control.  Patient not happy with cycle control on loloestrin Fe.  She does report improvement in menstrual flow as well as cessation of dysmenorrhea but irregular withdrawal bleeds.  Will trial on norethindrone with follow up in 8 weeks to assess response.  Patient is interested in possible hysterectomy, previously failed trial of IUD secondary to cervical stenosis secondary to prior LEEP.  4) Routine healthcare maintenance including cholesterol, diabetes screening discussed managed by PCP  5) Follow up 1 year for routine annual exam

## 2017-11-05 ENCOUNTER — Encounter: Payer: Self-pay | Admitting: Family Medicine

## 2017-11-05 ENCOUNTER — Ambulatory Visit (INDEPENDENT_AMBULATORY_CARE_PROVIDER_SITE_OTHER): Payer: 59 | Admitting: Family Medicine

## 2017-11-05 VITALS — BP 134/84 | HR 85 | Temp 98.2°F | Resp 16 | Ht 60.0 in | Wt 248.0 lb

## 2017-11-05 DIAGNOSIS — I10 Essential (primary) hypertension: Secondary | ICD-10-CM

## 2017-11-05 NOTE — Progress Notes (Signed)
Subjective:    Patient ID: Stacey Taylor, female    DOB: 1981-08-12, 37 y.o.   MRN: [id number]  Stacey Taylor is a 37 y.o. female presenting on 11/05/2017 for Hypertension   HPI   CHRONIC HTN Last visit with me 07/2017 for annual physical, she still had not started HCTZ Today reports that she does not plan to take HCTZ 12.5mg , does not want to take rx medication. She is interested in herbal or natural remedy or lifestyle improvements. Checking BP via wrist measurement, sometimes elevated Current Meds - None (self discontinued HCTZ) - Still admits life stressors - Diet trying to reduce salt, not always checking food labels Denies CP, dyspnea, HA, edema, dizziness / lightheadedness  Morbid Obesity BMI >48 / Abnormal Weight Gain See last note for discussion on obesity history Since last visit still wt gain +3-5 lbs in 4 months. She has scheduled apt with [facility] Dr Leafy Ro, within 1 week for first apt Does admit to limited exercise still, but often stays active with walking on job.  Additionally - She is adopted and asking about genetic testing, considering doing 23 and me - Regarding Edema, she never scheduled ECHO, had to cancel apt and never re-scheduled, she wants to hold off for now. She is interested in vascular consult, she wants to consider her options, will wait on referral   Depression screen [facility] 2/9 11/05/2017 07/30/2017 07/09/2017  Decreased Interest 0 0 0  Down, Depressed, Hopeless 0 0 0  PHQ - 2 Score 0 0 0    Social History   Tobacco Use  . Smoking status: Never Smoker  . Smokeless tobacco: Never Used  Substance Use Topics  . Alcohol use: No    Alcohol/week: 0.0 oz  . Drug use: No    Review of Systems Per HPI unless specifically indicated above     Objective:    BP 134/84 (BP Location: Left Arm, Cuff Size: Normal)   Pulse 85   Temp 98.2 F (36.8 C) (Oral)   Resp 16   Ht 5' (1.524 m)   Wt 248 lb (112.5 kg)   LMP 10/22/2017   BMI 48.43  kg/m   Wt Readings from Last 3 Encounters:  11/05/17 248 lb (112.5 kg)  10/29/17 245 lb (111.1 kg)  07/30/17 240 lb 9.6 oz (109.1 kg)    Physical Exam  Constitutional: She is oriented to person, place, and time. She appears well-developed and well-nourished. No distress.  Well-appearing, comfortable, cooperative, obese  HENT:  Head: Normocephalic and atraumatic.  Mouth/Throat: Oropharynx is clear and moist.  Frontal / maxillary sinuses non-tender. Nares patent without purulence or edema. Bilateral TMs clear without erythema, effusion or bulging. Oropharynx clear without erythema, exudates, edema or asymmetry.  Eyes: Conjunctivae and EOM are normal. Pupils are equal, round, and reactive to light. Right eye exhibits no discharge. Left eye exhibits no discharge.  Neck: Normal range of motion. Neck supple. No thyromegaly present.  Cardiovascular: Normal rate, regular rhythm, normal heart sounds and intact distal pulses.  No murmur heard. Pulmonary/Chest: Effort normal and breath sounds normal. No respiratory distress. She has no wheezes. She has no rales.  Abdominal: Soft. Bowel sounds are normal. She exhibits no distension and no mass. There is no tenderness.  Musculoskeletal: Normal range of motion. She exhibits edema (Improved, non pitting edema bilateral LE). She exhibits no tenderness.  Upper / Lower Extremities: - Normal muscle tone, strength bilateral upper extremities 5/5, lower extremities 5/5  Lymphadenopathy:  She has no cervical adenopathy.  Neurological: She is alert and oriented to person, place, and time.  Skin: Skin is warm and dry. No rash noted. She is not diaphoretic. No erythema.  Psychiatric: She has a normal mood and affect. Her behavior is normal.  Well groomed, good eye contact, normal speech and thoughts  Nursing note and vitals reviewed.      Assessment & Plan:   Problem List Items Addressed This Visit    Essential hypertension    Remains, uncontrolled,  never started med, non adherence on rx No outside BP readings - reported wrist measurement only No known complications  Plan:  1. Discussion on management of HTN - emphasized that if med non adherence limited benefit I can have for her, also if not adhering to lifestyle, agree with wt management referral as first plan now to try to lose weight and control BP without rx, however she needs to continue to work on goals - Limited options for non rx meds for HTN 2. Encourage improved lifestyle - low sodium diet, regular exercise 3. Start monitor BP outside office, bring readings to next visit, if persistently >140/90 or new symptoms notify office sooner 4. Follow-up 4 months HTN / weight      Obesity, Class III, BMI 40-49.9 (morbid obesity) (HCC) - Primary    Persistent chronic weight problem, recent weight gain still H/o difficulty losing wt, especially postpartum x 3. Other factors with poor lifestyle limited regular exercise BMI inc >48 Continue to encourage goals for starting healthy lifestyle changes diet / exercise Not interested in medications or surgery Proceed with follow-up with Dr Dennard Nip with [facility] Dr Dennard Nip, future consider bariatric if needed         No orders of the defined types were placed in this encounter.   Follow up plan: Return in about 4 months (around 03/05/2018) for HTN, Weight check.   A total of >25 minutes was spent face-to-face with this patient. Greater than 50% of this time was spent in counseling on management and complications of Hypertension and obesity, specific rx medications, non rx treatments, and lifestyle interventions with diet / exercise   Nobie Putnam, DO [facility] 11/05/2017, 5:59 PM

## 2017-11-05 NOTE — Assessment & Plan Note (Signed)
Remains, uncontrolled, never started med, non adherence on rx No outside BP readings - reported wrist measurement only No known complications  Plan:  1. Discussion on management of HTN - emphasized that if med non adherence limited benefit I can have for her, also if not adhering to lifestyle, agree with wt management referral as first plan now to try to lose weight and control BP without rx, however she needs to continue to work on goals - Limited options for non rx meds for HTN 2. Encourage improved lifestyle - low sodium diet, regular exercise 3. Start monitor BP outside office, bring readings to next visit, if persistently >140/90 or new symptoms notify office sooner 4. Follow-up 4 months HTN / weight

## 2017-11-05 NOTE — Assessment & Plan Note (Signed)
Persistent chronic weight problem, recent weight gain still H/o difficulty losing wt, especially postpartum x 3. Other factors with poor lifestyle limited regular exercise BMI inc >48 Continue to encourage goals for starting healthy lifestyle changes diet / exercise Not interested in medications or surgery Proceed with follow-up with Dr Dennard Nip with [facility] Dr Dennard Nip, future consider bariatric if needed

## 2017-11-05 NOTE — Patient Instructions (Addendum)
Thank you for coming to the office today.  1. Please call Dr Dennard Nip to get update on your appointment.  [facility] [place], [place] [address] Ph: [phone]  2.  VASCULAR SURGERY  [facility] [place], [place] [address]  Main: [phone]  Please schedule a Follow-up Appointment to: Return in about 4 months (around 03/05/2018) for HTN, Weight check.    If you have any other questions or concerns, please feel free to call the office or send a message through [place]. You may also schedule an earlier appointment if necessary.  Additionally, you may be receiving a survey about your experience at our office within a few days to 1 week by e-mail or mail. We value your feedback.  Nobie Putnam, DO [facility]

## 2017-12-03 ENCOUNTER — Encounter: Payer: Self-pay | Admitting: Family Medicine

## 2017-12-03 ENCOUNTER — Ambulatory Visit (INDEPENDENT_AMBULATORY_CARE_PROVIDER_SITE_OTHER): Payer: 59 | Admitting: Family Medicine

## 2017-12-03 ENCOUNTER — Encounter (INDEPENDENT_AMBULATORY_CARE_PROVIDER_SITE_OTHER): Payer: 59

## 2017-12-03 VITALS — BP 122/77 | HR 90 | Temp 98.2°F | Resp 16 | Ht 60.0 in | Wt 246.0 lb

## 2017-12-03 DIAGNOSIS — J019 Acute sinusitis, unspecified: Secondary | ICD-10-CM

## 2017-12-03 MED ORDER — IPRATROPIUM BROMIDE 0.06 % NA SOLN
2.0000 | Freq: Four times a day (QID) | NASAL | 0 refills | Status: DC
Start: 2017-12-03 — End: 2017-12-10

## 2017-12-03 MED ORDER — AMOXICILLIN 875 MG PO TABS: 875.0000 mg | ORAL_TABLET | Freq: Two times a day (BID) | ORAL | 0 refills | Status: DC

## 2017-12-03 NOTE — Patient Instructions (Addendum)
Thank you for coming to the office today.  1. It sounds like you have persistent Sinus Congestion or "Rhinosinusitis" - I do not think that this is a Bacterial Sinus Infection. Usually these are caused by Viruses or Allergies, and will run it's course in about 7 to 10 days. - No antibiotics are needed at this time, if you are not improved within 48 hours you can take Amoxicillin 875mg  BID x 7 days if not improved  Start Atrovent nasal spray decongestant 2 sprays in each nostril up to 4 times daily for 7 days  - Improve hydration by drinking plenty of clear fluids (water, gatorade) to reduce secretions and thin congestion - Congestion draining down throat can cause irritation. May try warm herbal tea with honey, cough drops - Can take Tylenol or Ibuprofen as needed for fevers  If you develop persistent fever >101F for at least 3 consecutive days, headaches with sinus pain or pressure or persistent earache, please schedule a follow-up evaluation within next few days to week.  Please schedule a Follow-up Appointment to: Return if symptoms worsen or fail to improve, for Sinusitis / Inner ear.    If you have any other questions or concerns, please feel free to call the office or send a message through [place]. You may also schedule an earlier appointment if necessary.  Additionally, you may be receiving a survey about your experience at our office within a few days to 1 week by e-mail or mail. We value your feedback.  Nobie Putnam, DO  1. You have symptoms of Vertigo (Benign Paroxysmal Positional Vertigo) - This is commonly caused by inner ear fluid imbalance, sometimes can be worsened by allergies and sinus symptoms, otherwise it can occur randomly sometimes and we may never discover the exact cause. - To treat this, try the Epley Manuever (see diagrams/instructions below) at home up to 3 times a day for 1-2 weeks or until symptoms resolve - You may take Meclizine as needed up to 3  times a day for dizziness, this will not cure symptoms but may help. Caution may make you drowsy.  If you develop significant worsening episode with vertigo that does not improve and you get severe headache, loss of vision, arm or leg weakness, slurred speech, or other concerning symptoms please seek immediate medical attention at Emergency Department.  Please schedule a follow-up appointment with Dr Parks Ranger within 4 weeks if Vertigo not improving, and will consider Referral to Vestibular Rehab  See the next page for images describing the Epley Manuever.     ----------------------------------------------------------------------------------------------------------------------

## 2017-12-03 NOTE — Progress Notes (Signed)
Subjective:    Patient ID: Stacey Taylor, female    DOB: September 13, 1981, 37 y.o.   MRN: [id number]  Stacey Taylor is a 37 y.o. female presenting on 12/03/2017 for Sinusitis (drainage, had migraine HA light Dizziness onset 2 days )   HPI   SINUSITIS / DIZZINESS Reports symptoms started Sinus drainage persistent in back of throat and some into chest, for few weeks she will clear her throat and cough up or hack up the sputum. Has used Claritin-D in past with some relief. Other regular allergy meds don't help. Has tried mucinex in past without improvement. In past has used Flonase and Nasonex - Also now with recent dizziness "swimmy headed or swishing" for past 3-4 days, gradual improvement now today. If moving more feels like "water behind ears" and will get dizzy Admits vertigo occasional room spinning Admits headache Admits some pain L ear Denies nausea vomiting fever chills, body aches, numbness tingling weakness   Depression screen [facility] 2/9 11/05/2017 07/30/2017 07/09/2017  Decreased Interest 0 0 0  Down, Depressed, Hopeless 0 0 0  PHQ - 2 Score 0 0 0    Social History   Tobacco Use  . Smoking status: Never Smoker  . Smokeless tobacco: Never Used  Substance Use Topics  . Alcohol use: No    Alcohol/week: 0.0 oz  . Drug use: No    Review of Systems Per HPI unless specifically indicated above     Objective:    BP 122/77   Pulse 90   Temp 98.2 F (36.8 C) (Oral)   Resp 16   Ht 5' (1.524 m)   Wt 246 lb (111.6 kg)   SpO2 99%   BMI 48.04 kg/m   Wt Readings from Last 3 Encounters:  12/03/17 246 lb (111.6 kg)  11/05/17 248 lb (112.5 kg)  10/29/17 245 lb (111.1 kg)    Physical Exam  Constitutional: She is oriented to person, place, and time. She appears well-developed and well-nourished. No distress.  Well-appearing, comfortable, cooperative, obese  HENT:  Head: Normocephalic and atraumatic.  Mouth/Throat: Oropharynx is clear and moist.  Frontal / maxillary sinuses  non-tender. Nares patent without purulence or edema. Bilateral TMs with some fullness and minor effusion without erythema or bulging. Oropharynx posterior pharyngeal drainage without erythema, exudates, edema or asymmetry.  Dix-hallpike negative bilateral  Eyes: Conjunctivae are normal. Right eye exhibits no discharge. Left eye exhibits no discharge.  Neck: Normal range of motion. Neck supple.  Cardiovascular: Normal rate, regular rhythm, normal heart sounds and intact distal pulses.  No murmur heard. Pulmonary/Chest: Effort normal and breath sounds normal. No respiratory distress. She has no wheezes. She has no rales.  Musculoskeletal: She exhibits no edema.  Lymphadenopathy:    She has no cervical adenopathy.  Neurological: She is alert and oriented to person, place, and time.  Skin: Skin is warm and dry. No rash noted. She is not diaphoretic. No erythema.  Psychiatric: Her behavior is normal.  Nursing note and vitals reviewed.  Results for orders placed or performed in visit on 08/25/17  POCT Rapid Strep A-Office (CPT [phone])  Result Value Ref Range   Rapid Strep A Screen Negative Negative      Assessment & Plan:   Problem List Items Addressed This Visit    None    Visit Diagnoses    Subacute rhinosinusitis    -  Primary   Relevant Medications   ipratropium (ATROVENT) 0.06 % nasal spray   amoxicillin (AMOXIL) 875 MG  tablet     Consistent with subacute rhinosinusitis, likely initially viral URI vs allergic rhinitis component with x 3-4 weeks persistent symptoms with questionable worsening concern for bacterial infection.  Also vertigo symptoms concern for inner ear BPPV not reproduced on exam  Plan: 1. Start antibiotics Amoxicillin 875 BID x 7 days Start Atrovent nasal spray decongestant 2 sprays in each nostril up to 4 times daily for 7 days 2 Handout Epley maneuver 3. Return criteria reviewed    Meds ordered this encounter  Medications  . ipratropium (ATROVENT) 0.06 %  nasal spray    Sig: Place 2 sprays into both nostrils 4 (four) times daily. For up to 5-7 days then stop.    Dispense:  15 mL    Refill:  0  . amoxicillin (AMOXIL) 875 MG tablet    Sig: Take 1 tablet (875 mg total) by mouth 2 (two) times daily. For 7 days    Dispense:  14 tablet    Refill:  0      Follow up plan: Return if symptoms worsen or fail to improve, for Sinusitis / Inner ear.  Nobie Putnam, [facility] 12/03/2017, 3:18 PM

## 2017-12-10 ENCOUNTER — Ambulatory Visit (INDEPENDENT_AMBULATORY_CARE_PROVIDER_SITE_OTHER): Payer: 59 | Admitting: Family Medicine

## 2017-12-10 ENCOUNTER — Encounter (INDEPENDENT_AMBULATORY_CARE_PROVIDER_SITE_OTHER): Payer: Self-pay | Admitting: Family Medicine

## 2017-12-10 VITALS — BP 141/94 | HR 83 | Temp 98.1°F | Ht 59.0 in | Wt 244.0 lb

## 2017-12-10 DIAGNOSIS — Z6841 Body Mass Index (BMI) 40.0 and over, adult: Secondary | ICD-10-CM

## 2017-12-10 DIAGNOSIS — Z1331 Encounter for screening for depression: Secondary | ICD-10-CM | POA: Diagnosis not present

## 2017-12-10 DIAGNOSIS — R0602 Shortness of breath: Secondary | ICD-10-CM

## 2017-12-10 DIAGNOSIS — Z9189 Other specified personal risk factors, not elsewhere classified: Secondary | ICD-10-CM

## 2017-12-10 DIAGNOSIS — R5383 Other fatigue: Secondary | ICD-10-CM

## 2017-12-10 DIAGNOSIS — E559 Vitamin D deficiency, unspecified: Secondary | ICD-10-CM

## 2017-12-10 DIAGNOSIS — R609 Edema, unspecified: Secondary | ICD-10-CM | POA: Diagnosis not present

## 2017-12-10 DIAGNOSIS — I10 Essential (primary) hypertension: Secondary | ICD-10-CM

## 2017-12-10 DIAGNOSIS — E66813 Obesity, class 3: Secondary | ICD-10-CM

## 2017-12-10 DIAGNOSIS — Z0289 Encounter for other administrative examinations: Secondary | ICD-10-CM

## 2017-12-10 NOTE — Progress Notes (Signed)
Office: [phone]  /  Fax: [phone]   Dear Dr. Parks Taylor,   Thank you for referring Stacey Taylor to our clinic. The following note includes my evaluation and treatment recommendations.  HPI:   Chief Complaint: OBESITY    Stacey Taylor has been referred by Stacey Hauser, DO for consultation regarding her obesity and obesity related comorbidities.    Stacey Taylor (MR# [id number]) is a 37 y.o. female who presents on 12/10/2017 for obesity evaluation and treatment. Current BMI is Body mass index is 49.28 kg/m.Marland Kitchen Stacey Taylor has been struggling with her weight for many years and has been unsuccessful in either losing weight, maintaining weight loss, or reaching her healthy weight goal.     Stacey Taylor attended our information session and states she is currently in the action stage of change and ready to dedicate time achieving and maintaining a healthier weight. Stacey Taylor is interested in becoming our Stacey Taylor and working on intensive lifestyle modifications including (but not limited to) diet, exercise and weight loss.    Stacey Taylor states her family eats meals together her desired weight loss is 125 lbs she started gaining weight after her 4th child her heaviest weight ever was 245 lbs. she is a picky eater and doesn't like to eat healthier foods  she has significant food cravings issues  she skips meals frequently she frequently makes poor food choices she frequently eats larger portions than normal  she has binge eating behaviors   Fatigue Stacey Taylor feels her energy is lower than it should be. This has worsened with weight gain and has not worsened recently. She has 5 to 6 awakenings per night to urinate, but she refused discussion of sleep apnea. Stacey Taylor admits to daytime somnolence and admits to waking up still tired. Stacey Taylor is at risk for obstructive sleep apnea. Patent has a history of symptoms of daytime fatigue, morning fatigue, morning headache and hypertension. Stacey Taylor  generally gets 7 hours of sleep per night, and states they generally have restless sleep. Snoring is present. Apneic episodes are present. Epworth Sleepiness Score is 7  Dyspnea on exertion Stacey Taylor notes increasing shortness of breath with exercising and seems to be worsening over time with weight gain. She notes getting out of breath sooner with activity than she used to. This has not gotten worse recently. She was previously sent for echocardiogram, but refused echocardiogram appointment. She has EKG with Q waves, but this is unchanged since 2008. Stacey Taylor denies orthopnea.  Vitamin D deficiency Stacey Taylor has a likely diagnosis of vitamin D deficiency, given her obesity. She is not currently taking vit D and denies nausea, vomiting or muscle weakness.  Hypertension Stacey Taylor is a 37 y.o. female with hypertension. She was previously prescribed HCTZ, but did not take her medication. Her blood pressure is elevated today at 141/94 and Stacey Taylor again states, she will not take medication. Stacey Taylor denies chest pain, chest pressure or headache. She is working weight loss to help control her blood pressure with the goal of decreasing her risk of heart attack and stroke. Stacey Taylor blood pressure is not currently controlled.  Edema Stacey Taylor has a chronic problem of edema. She refused echocardiogram in the past, and was supposed to see vascular and vein specialist.  At risk for cardiovascular disease Stacey Taylor is at a higher than average risk for cardiovascular disease due to obesity, hypertension and edema. She currently denies any chest pain.  Depression Screen Stacey Taylor's Food and Mood (modified PHQ-9) score was  Depression screen [facility] 2/9  12/10/2017  Decreased Interest 1  Down, Depressed, Hopeless 1  PHQ - 2 Score 2  Altered sleeping 1  Tired, decreased energy 3  Change in appetite 3  Feeling bad or failure about yourself  0  Trouble concentrating 0  Moving slowly or fidgety/restless 0    Suicidal thoughts 0  PHQ-9 Score 9  Difficult doing work/chores Not difficult at all    ALLERGIES: Allergies  Allergen Reactions  . Latex Hives    MEDICATIONS: Current Outpatient Medications on File Prior to Visit  Medication Sig Dispense Refill  . cyclobenzaprine (FLEXERIL) 5 MG tablet 1 tablet every 8 hours as needed for muscle spasms 30 tablet 2  . norethindrone (AYGESTIN) 5 MG tablet Take 1 tablet (5 mg total) by mouth daily. 30 tablet 11   No current facility-administered medications on file prior to visit.     PAST MEDICAL HISTORY: Past Medical History:  Diagnosis Date  . Abnormal Pap smear of anus    HGSIL  . Anemia   . Back pain   . Cervical dysplasia    CIN III  . Endometriosis   . Gallbladder problem   . Headache   . Heart murmur   . Hypertension   . Leg edema   . Obesity   . Obesity   . Premature delivery    x2    PAST SURGICAL HISTORY: Past Surgical History:  Procedure Laterality Date  . CERVICAL BIOPSY  W/ LOOP ELECTRODE EXCISION  11/2005  . CESAREAN SECTION  2008/2015  . CESAREAN SECTION WITH BILATERAL TUBAL LIGATION  2015  . CHOLECYSTECTOMY  2003  . COLPOSCOPY  2005, 2006  . COMBINED HYSTEROSCOPY DIAGNOSTIC / D&C  2014  . CORONARY ARTERY BYPASS GRAFT      SOCIAL HISTORY: Social History   Tobacco Use  . Smoking status: Never Smoker  . Smokeless tobacco: Never Used  Substance Use Topics  . Alcohol use: No    Alcohol/week: 0.0 oz  . Drug use: No    FAMILY HISTORY: Family History  Adopted: Yes  Family history unknown: Yes    ROS: Review of Systems  Constitutional: Positive for malaise/fatigue.  Respiratory: Positive for shortness of breath (on exertion).   Cardiovascular: Negative for chest pain.       Negative for chest pressure  Gastrointestinal: Negative for nausea and vomiting.  Musculoskeletal:       Negative for muscle weakness  Neurological: Negative for headaches.    PHYSICAL EXAM: Blood pressure (!) 141/94,  pulse 83, temperature 98.1 F (36.7 C), temperature source Oral, height 4\' 11"  (1.499 m), weight 244 lb (110.7 kg), last menstrual period 10/09/2017, SpO2 97 %. Body mass index is 49.28 kg/m. Physical Exam  Constitutional: She is oriented to person, place, and time. She appears well-developed and well-nourished.  HENT:  Head: Normocephalic and atraumatic.  Nose: Nose normal.  Eyes: EOM are normal. No scleral icterus.  Neck: Normal range of motion. Neck supple. No thyromegaly present.  Cardiovascular: Normal rate.  Murmur heard.  Systolic (heard best at pulmonic area) murmur is present with a grade of 1/6. Pulmonary/Chest: Effort normal. No respiratory distress.  Abdominal: Soft. There is no tenderness.  + obesity  Musculoskeletal: Normal range of motion. She exhibits edema (2+ non pitting edema).  Range of Motion normal in all 4 extremities  Neurological: She is alert and oriented to person, place, and time. Coordination normal.  Skin: Skin is warm and dry.  Positive for acanthosis nigricans  Psychiatric: She has a  normal mood and affect. Her behavior is normal.    RECENT LABS AND TESTS: BMET    Component Value Date/Time   NA 142 07/23/2017 0903   NA 139 01/14/2013 0150   K 3.8 07/23/2017 0903   K 3.6 01/14/2013 0150   CL 107 07/23/2017 0903   CL 107 01/14/2013 0150   CO2 27 07/23/2017 0903   CO2 23 01/14/2013 0150   GLUCOSE 97 07/23/2017 0903   GLUCOSE 105 (H) 01/14/2013 0150   BUN 9 07/23/2017 0903   BUN 10 01/14/2013 0150   CREATININE 0.54 07/23/2017 0903   CALCIUM 8.6 07/23/2017 0903   CALCIUM 8.6 01/14/2013 0150   GFRNONAA 122 07/23/2017 0903   GFRAA 142 07/23/2017 0903   Lab Results  Component Value Date   HGBA1C 5.4 07/23/2017   No results found for: INSULIN CBC    Component Value Date/Time   WBC 7.0 07/23/2017 0903   RBC 4.18 07/23/2017 0903   HGB 11.7 07/23/2017 0903   HGB 10.7 (L) 11/09/2013 1059   HCT 35.3 07/23/2017 0903   HCT 26.6 (L)  11/11/2013 0728   PLT 395 07/23/2017 0903   PLT 312 11/09/2013 1059   MCV 84.4 07/23/2017 0903   MCV 86 11/09/2013 1059   MCH 28.0 07/23/2017 0903   MCHC 33.1 07/23/2017 0903   RDW 13.1 07/23/2017 0903   RDW 14.0 11/09/2013 1059   LYMPHSABS 2,254 07/23/2017 0903   LYMPHSABS 1.9 11/09/2013 1059   MONOABS 0.6 11/09/2013 1059   EOSABS 91 07/23/2017 0903   EOSABS 0.0 11/09/2013 1059   BASOSABS 42 07/23/2017 0903   BASOSABS 0.0 11/09/2013 1059   Iron/TIBC/Ferritin/ %Sat No results found for: IRON, TIBC, FERRITIN, IRONPCTSAT Lipid Panel     Component Value Date/Time   CHOL 161 07/23/2017 0903   TRIG 74 07/23/2017 0903   HDL 51 07/23/2017 0903   CHOLHDL 3.2 07/23/2017 0903   Hepatic Function Panel     Component Value Date/Time   PROT 7.0 07/23/2017 0903   PROT 7.8 01/14/2013 0150   ALBUMIN 3.3 (L) 01/14/2013 0150   AST 13 07/23/2017 0903   AST 15 01/14/2013 0150   ALT 11 07/23/2017 0903   ALT 25 01/14/2013 0150   ALKPHOS 78 01/14/2013 0150   BILITOT 0.3 07/23/2017 0903   BILITOT 0.3 01/14/2013 0150      Component Value Date/Time   TSH 3.23 07/23/2017 0903   Vitamin D There are no recent labs  ECG  shows NSR with a rate of 87 BPM INDIRECT CALORIMETER done today shows a VO2 of 292 and a REE of 2031.  Her calculated basal metabolic rate is 4235 thus her basal metabolic rate is better than expected.    ASSESSMENT AND PLAN: Other fatigue - Plan: EKG 12-Lead, Vitamin B12, CBC With Differential, Comprehensive metabolic panel, Folate, Hemoglobin A1c, Insulin, random, Lipid Panel With LDL/HDL Ratio, T3, T4, free, TSH  Shortness of breath on exertion - Plan: CBC With Differential  Essential hypertension - Plan: Comprehensive metabolic panel, Lipid Panel With LDL/HDL Ratio  Edema, unspecified type  Vitamin D deficiency - Plan: VITAMIN D 25 Hydroxy (Vit-D Deficiency, Fractures)  Depression screening  At risk for heart disease  Class 3 severe obesity with serious  comorbidity and body mass index (BMI) of 45.0 to 49.9 in adult, unspecified obesity type (HCC)  PLAN: Fatigue Parrish was informed that her fatigue may be related to obesity, depression or many other causes. Labs will be ordered, and in the meanwhile [employer]  has agreed to work on diet, exercise and weight loss to help with fatigue. Proper sleep hygiene was discussed including the need for 7-8 hours of quality sleep each night. We will order EKG and Indirect Calorimetry. Epworth score is 7.  Dyspnea on exertion Robbye's shortness of breath appears to be obesity related and exercise induced. She has agreed to work on weight loss and gradually increase exercise to treat her exercise induced shortness of breath. If Jamye follows our instructions and loses weight without improvement of her shortness of breath, we will plan to refer to pulmonology. We will monitor this condition regularly. Ivery agrees to this plan.  Vitamin D Deficiency Kenza was informed that low vitamin D levels contributes to fatigue and are associated with obesity, breast, and colon cancer. We will check vitamin D level today and will follow up for routine testing of vitamin D, at least 2-3 times per year.  Hypertension We discussed sodium restriction, working on healthy weight loss, and a regular exercise program as the means to achieve improved blood pressure control. Jessa agreed with this plan and agreed to follow up as directed. We will follow up monitoring at her next visit and will continue to monitor her blood pressure as well as her progress with the above lifestyle modifications. She will watch for signs of hypotension as she continues her lifestyle modifications.  Edema Astryd refused echocardiogram referral today.  Cardiovascular risk counseling Anberlyn was given extended (15 minutes) coronary artery disease prevention counseling today. She is 37 y.o. female and has risk factors for heart disease including  obesity, hypertension and edema. We discussed intensive lifestyle modifications today with an emphasis on specific weight loss instructions and strategies. Pt was also informed of the importance of increasing exercise and decreasing saturated fats to help prevent heart disease.  Depression Screen Gwendy had a mildly positive depression screening. Depression is commonly associated with obesity and often results in emotional eating behaviors. We will monitor this closely and work on CBT to help improve the non-hunger eating patterns. Referral to Psychology may be required if no improvement is seen as she continues in our clinic.  Obesity I do not feel Claudia is in the action stage of change. She is resistant to almost all options presented to her, but repeatedly states she wants to know what the problem is with her inability to lose weight. Her goal is to continue with weight loss efforts. I recommend Rut begin the structured treatment plan as follows:  She has agreed to keep a food journal with 1300 to 1500 calories and 90 grams of protein daily Sherell has been instructed to eventually work up to a goal of 150 minutes of combined cardio and strengthening exercise per week for weight loss and overall health benefits. We discussed the following Behavioral Modification Strategies today: keep a strict food journal, increasing lean protein intake and decrease eating out   She was informed of the importance of frequent follow up visits to maximize her success with intensive lifestyle modifications for her multiple health conditions. She was informed we would discuss her lab results at her next visit unless there is a critical issue that needs to be addressed sooner. Dawnmarie agreed to keep her next visit at the agreed upon time to discuss these results.    OBESITY BEHAVIORAL INTERVENTION VISIT  Today's visit was # 1 out of 22.  Starting weight: 244 lbs Starting date: 12/10/17 Today's weight : 244  lbs  Today's date: 12/10/2017 Total lbs lost  to date: 0 (Patients must lose 7 lbs in the first 6 months to continue with counseling)   ASK: We discussed the diagnosis of obesity with Sheena A Kina today and Chivonne agreed to give [place] permission to discuss obesity behavioral modification therapy today.  ASSESS: Hanan has the diagnosis of obesity and her BMI today is 49.26 Rhandi is in the action stage of change   ADVISE: Jaslynn was educated on the multiple health risks of obesity as well as the benefit of weight loss to improve her health. She was advised of the need for long term treatment and the importance of lifestyle modifications.  AGREE: Multiple dietary modification options and treatment options were discussed and  Lamoyne agreed to the above obesity treatment plan.   I, Doreene Nest, am acting as transcriptionist for  Ilene Qua, MD  I have reviewed the above documentation for accuracy and completeness, and I agree with the above. - Ilene Qua, MD

## 2017-12-14 LAB — COMPREHENSIVE METABOLIC PANEL
ALK PHOS: 53 IU/L (ref 39–117)
ALT: 15 IU/L (ref 0–32)
AST: 11 IU/L (ref 0–40)
Albumin/Globulin Ratio: 1.1 — ABNORMAL LOW (ref 1.2–2.2)
Albumin: 3.9 g/dL (ref 3.5–5.5)
BILIRUBIN TOTAL: 0.3 mg/dL (ref 0.0–1.2)
BUN / CREAT RATIO: 15 (ref 9–23)
BUN: 8 mg/dL (ref 6–20)
CHLORIDE: 103 mmol/L (ref 96–106)
CO2: 23 mmol/L (ref 20–29)
Calcium: 8.7 mg/dL (ref 8.7–10.2)
Creatinine, Ser: 0.54 mg/dL — ABNORMAL LOW (ref 0.57–1.00)
GFR calc Af Amer: 140 mL/min/{1.73_m2} (ref >59)
GFR calc non Af Amer: 122 mL/min/{1.73_m2} (ref >59)
GLUCOSE: 74 mg/dL (ref 65–99)
Globulin, Total: 3.5 g/dL (ref 1.5–4.5)
Potassium: 3.8 mmol/L (ref 3.5–5.2)
Sodium: 140 mmol/L (ref 134–144)
Total Protein: 7.4 g/dL (ref 6.0–8.5)

## 2017-12-14 LAB — CBC WITH DIFFERENTIAL
BASOS ABS: 0 10*3/uL (ref 0.0–0.2)
Basos: 0 %
EOS (ABSOLUTE): 0.1 10*3/uL (ref 0.0–0.4)
Eos: 1 %
Hematocrit: 37 % (ref 34.0–46.6)
Hemoglobin: 12.2 g/dL (ref 11.1–15.9)
Immature Grans (Abs): 0 10*3/uL (ref 0.0–0.1)
Immature Granulocytes: 0 %
LYMPHS ABS: 2.5 10*3/uL (ref 0.7–3.1)
Lymphs: 30 %
MCH: 28.1 pg (ref 26.6–33.0)
MCHC: 33 g/dL (ref 31.5–35.7)
MCV: 85 fL (ref 79–97)
Monocytes Absolute: 0.4 10*3/uL (ref 0.1–0.9)
Monocytes: 5 %
NEUTROS ABS: 5.3 10*3/uL (ref 1.4–7.0)
Neutrophils: 64 %
RBC: 4.34 x10E6/uL (ref 3.77–5.28)
RDW: 13.5 % (ref 12.3–15.4)
WBC: 8.3 10*3/uL (ref 3.4–10.8)

## 2017-12-14 LAB — VITAMIN D 25 HYDROXY (VIT D DEFICIENCY, FRACTURES): VIT D 25 HYDROXY: 12.7 ng/mL — AB (ref 30.0–100.0)

## 2017-12-14 LAB — LIPID PANEL WITH LDL/HDL RATIO
Cholesterol, Total: 154 mg/dL (ref 100–199)
HDL: 35 mg/dL — AB (ref >39)
LDL CALC: 110 mg/dL — AB (ref 0–99)
LDL/HDL RATIO: 3.1 ratio (ref 0.0–3.2)
TRIGLYCERIDES: 43 mg/dL (ref 0–149)
VLDL CHOLESTEROL CAL: 9 mg/dL (ref 5–40)

## 2017-12-14 LAB — HEMOGLOBIN A1C
Est. average glucose Bld gHb Est-mCnc: 120 mg/dL
HEMOGLOBIN A1C: 5.8 % — AB (ref 4.8–5.6)

## 2017-12-14 LAB — INSULIN, RANDOM: INSULIN: 39.7 u[IU]/mL — AB (ref 2.6–24.9)

## 2017-12-14 LAB — VITAMIN B12: Vitamin B-12: <150 pg/mL — ABNORMAL LOW (ref 232–1245)

## 2017-12-14 LAB — T3: T3, Total: 163 ng/dL (ref 71–180)

## 2017-12-14 LAB — FOLATE: FOLATE: 4.5 ng/mL (ref >3.0)

## 2017-12-14 LAB — T4, FREE: Free T4: 1.35 ng/dL (ref 0.82–1.77)

## 2017-12-14 LAB — TSH: TSH: 1.9 u[IU]/mL (ref 0.450–4.500)

## 2017-12-24 ENCOUNTER — Ambulatory Visit (INDEPENDENT_AMBULATORY_CARE_PROVIDER_SITE_OTHER): Payer: 59 | Admitting: Family Medicine

## 2017-12-24 VITALS — BP 147/96 | HR 78 | Temp 97.7°F | Ht 59.0 in | Wt 241.0 lb

## 2017-12-24 DIAGNOSIS — Z6841 Body Mass Index (BMI) 40.0 and over, adult: Secondary | ICD-10-CM | POA: Diagnosis not present

## 2017-12-24 DIAGNOSIS — E559 Vitamin D deficiency, unspecified: Secondary | ICD-10-CM | POA: Diagnosis not present

## 2017-12-24 DIAGNOSIS — E538 Deficiency of other specified B group vitamins: Secondary | ICD-10-CM

## 2017-12-24 DIAGNOSIS — R7303 Prediabetes: Secondary | ICD-10-CM | POA: Diagnosis not present

## 2017-12-24 DIAGNOSIS — Z9189 Other specified personal risk factors, not elsewhere classified: Secondary | ICD-10-CM | POA: Diagnosis not present

## 2017-12-24 DIAGNOSIS — R0602 Shortness of breath: Secondary | ICD-10-CM | POA: Diagnosis not present

## 2017-12-24 MED ORDER — METFORMIN HCL 500 MG PO TABS: 500.0000 mg | ORAL_TABLET | Freq: Every day | ORAL | 0 refills | Status: DC

## 2017-12-24 MED ORDER — VITAMIN D (ERGOCALCIFEROL) 1.25 MG (50000 UNIT) PO CAPS: 50000.0000 [IU] | ORAL_CAPSULE | ORAL | 0 refills | Status: DC

## 2017-12-24 MED ORDER — VITAMIN B-12 1000 MCG PO TABS
2000.0000 ug | ORAL_TABLET | Freq: Every day | ORAL | 0 refills | Status: DC
Start: 2017-12-24 — End: 2018-09-05

## 2017-12-24 NOTE — Progress Notes (Signed)
Office: [phone]  /  Fax: [phone]   HPI:   Chief Complaint: OBESITY Stacey Taylor is here to discuss her progress with her obesity treatment plan. She is on the  keep a food journal with 1300 to 1500 calories and 90 grams of protein daily and is following her eating plan approximately 80 % of the time. She states she is exercising 0 minutes 0 times per week. Stacey Taylor has 2 kids with the flu. She would forget to journal frequently, but would remember and then journal. She felt it was difficult to get in her protein amount. Her weight is 241 lb (109.3 kg) today and has had a weight loss of 3 pounds over a period of 2 weeks since her last visit. She has lost 3 lbs since starting treatment with [place].  Vitamin D deficiency Stacey Taylor has a diagnosis of vitamin D deficiency. She is not currently taking vit D and admits fatigue, but denies nausea, vomiting or muscle weakness.   Ref. Range 12/10/2017 11:17  Vitamin D, 25-Hydroxy Latest Ref Range: 30.0 - 100.0 ng/mL 12.7 (L)   Pre-Diabetes Stacey Taylor has a diagnosis of pre-diabetes based on her elevated Hgb A1c at 5.8 and insulin at 39.7 She was informed this puts her at greater risk of developing diabetes. She is not taking metformin currently and continues to work on diet and exercise to decrease risk of diabetes. She denies nausea and admits hypoglycemia.  At risk for diabetes Stacey Taylor is at higher than average risk for developing diabetes due to her obesity and pre-diabetes. She admits  polyuria and denies  Polydipsia.  Vitamin B12 deficiency Stacey Taylor has a diagnosis of B12 insufficiency and notes fatigue. This is a new diagnosis. Stacey Taylor is not a vegetarian and does have a history of anemia (not anemic at this time). She does not have a history of weight loss surgery.   Shortness of Breath Stacey Taylor is positive for a heart murmur. She is positive for swelling and admits to numerous nighttime awakenings.  ALLERGIES: Allergies  Allergen Reactions  .  Latex Hives    MEDICATIONS: Current Outpatient Medications on File Prior to Visit  Medication Sig Dispense Refill  . cyclobenzaprine (FLEXERIL) 5 MG tablet 1 tablet every 8 hours as needed for muscle spasms 30 tablet 2  . norethindrone (AYGESTIN) 5 MG tablet Take 1 tablet (5 mg total) by mouth daily. 30 tablet 11   No current facility-administered medications on file prior to visit.     PAST MEDICAL HISTORY: Past Medical History:  Diagnosis Date  . Abnormal Pap smear of anus    HGSIL  . Anemia   . Back pain   . Cervical dysplasia    CIN III  . Endometriosis   . Gallbladder problem   . Headache   . Heart murmur   . Hypertension   . Leg edema   . Obesity   . Obesity   . Premature delivery    x2    PAST SURGICAL HISTORY: Past Surgical History:  Procedure Laterality Date  . CERVICAL BIOPSY  W/ LOOP ELECTRODE EXCISION  11/2005  . CESAREAN SECTION  2008/2015  . CESAREAN SECTION WITH BILATERAL TUBAL LIGATION  2015  . CHOLECYSTECTOMY  2003  . COLPOSCOPY  2005, 2006  . COMBINED HYSTEROSCOPY DIAGNOSTIC / D&C  2014  . CORONARY ARTERY BYPASS GRAFT      SOCIAL HISTORY: Social History   Tobacco Use  . Smoking status: Never Smoker  . Smokeless tobacco: Never Used  Substance Use Topics  .  Alcohol use: No    Alcohol/week: 0.0 oz  . Drug use: No    FAMILY HISTORY: Family History  Adopted: Yes  Family history unknown: Yes    ROS: Review of Systems  Constitutional: Positive for malaise/fatigue and weight loss.  Gastrointestinal: Negative for nausea and vomiting.  Genitourinary: Positive for frequency.  Musculoskeletal:       Negative for muscle weakness  Endo/Heme/Allergies: Negative for polydipsia.       Positive for hypoglycemia    PHYSICAL EXAM: Blood pressure (!) 147/96, pulse 78, temperature 97.7 F (36.5 C), temperature source Oral, height 4\' 11"  (1.499 m), weight 241 lb (109.3 kg), SpO2 99 %. Body mass index is 48.68 kg/m. Physical Exam    Constitutional: She is oriented to person, place, and time. She appears well-developed and well-nourished.  Cardiovascular: Normal rate.  Pulmonary/Chest: Effort normal.  Musculoskeletal: Normal range of motion.  Neurological: She is oriented to person, place, and time.  Skin: Skin is warm and dry.  Psychiatric: She has a normal mood and affect. Her behavior is normal.  Vitals reviewed.   RECENT LABS AND TESTS: BMET    Component Value Date/Time   NA 140 12/10/2017 1117   NA 139 01/14/2013 0150   K 3.8 12/10/2017 1117   K 3.6 01/14/2013 0150   CL 103 12/10/2017 1117   CL 107 01/14/2013 0150   CO2 23 12/10/2017 1117   CO2 23 01/14/2013 0150   GLUCOSE 74 12/10/2017 1117   GLUCOSE 97 07/23/2017 0903   GLUCOSE 105 (H) 01/14/2013 0150   BUN 8 12/10/2017 1117   BUN 10 01/14/2013 0150   CREATININE 0.54 (L) 12/10/2017 1117   CREATININE 0.54 07/23/2017 0903   CALCIUM 8.7 12/10/2017 1117   CALCIUM 8.6 01/14/2013 0150   GFRNONAA 122 12/10/2017 1117   GFRNONAA 122 07/23/2017 0903   GFRAA 140 12/10/2017 1117   GFRAA 142 07/23/2017 0903   Lab Results  Component Value Date   HGBA1C 5.8 (H) 12/10/2017   HGBA1C 5.4 07/23/2017   Lab Results  Component Value Date   INSULIN 39.7 (H) 12/10/2017   CBC    Component Value Date/Time   WBC 8.3 12/10/2017 1117   WBC 7.0 07/23/2017 0903   RBC 4.34 12/10/2017 1117   RBC 4.18 07/23/2017 0903   HGB 12.2 12/10/2017 1117   HCT 37.0 12/10/2017 1117   PLT 395 07/23/2017 0903   PLT 312 11/09/2013 1059   MCV 85 12/10/2017 1117   MCV 86 11/09/2013 1059   MCH 28.1 12/10/2017 1117   MCH 28.0 07/23/2017 0903   MCHC 33.0 12/10/2017 1117   MCHC 33.1 07/23/2017 0903   RDW 13.5 12/10/2017 1117   RDW 14.0 11/09/2013 1059   LYMPHSABS 2.5 12/10/2017 1117   LYMPHSABS 1.9 11/09/2013 1059   MONOABS 0.6 11/09/2013 1059   EOSABS 0.1 12/10/2017 1117   EOSABS 0.0 11/09/2013 1059   BASOSABS 0.0 12/10/2017 1117   BASOSABS 0.0 11/09/2013 1059    Iron/TIBC/Ferritin/ %Sat No results found for: IRON, TIBC, FERRITIN, IRONPCTSAT Lipid Panel     Component Value Date/Time   CHOL 154 12/10/2017 1117   TRIG 43 12/10/2017 1117   HDL 35 (L) 12/10/2017 1117   CHOLHDL 3.2 07/23/2017 0903   LDLCALC 110 (H) 12/10/2017 1117   Hepatic Function Panel     Component Value Date/Time   PROT 7.4 12/10/2017 1117   PROT 7.8 01/14/2013 0150   ALBUMIN 3.9 12/10/2017 1117   ALBUMIN 3.3 (L) 01/14/2013 0150  AST 11 12/10/2017 1117   AST 15 01/14/2013 0150   ALT 15 12/10/2017 1117   ALT 25 01/14/2013 0150   ALKPHOS 53 12/10/2017 1117   ALKPHOS 78 01/14/2013 0150   BILITOT 0.3 12/10/2017 1117   BILITOT 0.3 01/14/2013 0150      Component Value Date/Time   TSH 1.900 12/10/2017 1117   TSH 3.23 07/23/2017 0903     Ref. Range 12/10/2017 11:17  Vitamin D, 25-Hydroxy Latest Ref Range: 30.0 - 100.0 ng/mL 12.7 (L)   ASSESSMENT AND PLAN: Prediabetes - Plan: metFORMIN (GLUCOPHAGE) 500 MG tablet  B12 deficiency - Plan: vitamin B-12 (CYANOCOBALAMIN) 1000 MCG tablet  Vitamin D deficiency - Plan: Vitamin D, Ergocalciferol, (DRISDOL) 50000 units CAPS capsule  Shortness of breath - Plan: ECHOCARDIOGRAM COMPLETE  At risk for diabetes mellitus  Class 3 severe obesity with serious comorbidity and body mass index (BMI) of 45.0 to 49.9 in adult, unspecified obesity type ([place])  PLAN:  Vitamin D Deficiency Stacey Taylor was informed that low vitamin D levels contributes to fatigue and are associated with obesity, breast, and colon cancer. She agrees to start to take prescription Vit D @50 ,000 IU every week #4 with no refills and will follow up for routine testing of vitamin D, at least 2-3 times per year. She was informed of the risk of over-replacement of vitamin D and agrees to not increase her dose unless she discusses this with [place] first. Stacey Taylor agrees to follow up with our clinic in 2 weeks.  Pre-Diabetes Stacey Taylor will continue to work on weight loss,  exercise, and decreasing simple carbohydrates in her diet to help decrease the risk of diabetes. We dicussed metformin including benefits and risks. She was informed that eating too many simple carbohydrates or too many calories at one sitting increases the likelihood of GI side effects. Stacey Taylor agrees to start Metformin 500 mg po qam #30 with no refills. Stacey Taylor agreed to follow up with [place] as directed to monitor her progress.  Diabetes risk counseling Stacey Taylor was given extended (30 minutes) diabetes prevention counseling today. She is 37 y.o. female and has risk factors for diabetes including obesity and pre-diabetes. We discussed intensive lifestyle modifications today with an emphasis on weight loss as well as increasing exercise and decreasing simple carbohydrates in her diet.  Vitamin B12 deficiency Stacey Taylor will work on increasing B12 rich foods in her diet. B12 supplementation was prescribed today. Stacey Taylor agrees to follow up with our clinic in 2 weeks.  Shortness of Breath We will order echocardiogram ([facility]) and Stacey Taylor agrees to follow up with our clinic in 2 weeks.  Obesity Stacey Taylor is currently in the action stage of change. As such, her goal is to continue with weight loss efforts She has agreed to keep a food journal with 1500 to 1700 calories and 90 grams of protein daily Stacey Taylor has been instructed to work up to a goal of 150 minutes of combined cardio and strengthening exercise per week for weight loss and overall health benefits. We discussed the following Behavioral Modification Strategies today: better snacking choices, keep a strict food journal, increasing lean protein intake, work on meal planning and easy cooking plans and travel eating strategies   Stacey Taylor has agreed to follow up with our clinic in 2 weeks. She was informed of the importance of frequent follow up visits to maximize her success with intensive lifestyle modifications for her multiple health  conditions.   OBESITY BEHAVIORAL INTERVENTION VISIT  Today's visit was # 2 out of  22.  Starting weight: 244 lbs Starting date: 12/10/17 Today's weight : 241 lbs Today's date: 12/24/2017 Total lbs lost to date: 3 (Patients must lose 7 lbs in the first 6 months to continue with counseling)   ASK: We discussed the diagnosis of obesity with Stacey Taylor today and Stacey Taylor agreed to give [place] permission to discuss obesity behavioral modification therapy today.  ASSESS: Stacey Taylor has the diagnosis of obesity and her BMI today is 48.65 Stacey Taylor is in the action stage of change   ADVISE: Stacey Taylor was educated on the multiple health risks of obesity as well as the benefit of weight loss to improve her health. She was advised of the need for long term treatment and the importance of lifestyle modifications.  AGREE: Multiple dietary modification options and treatment options were discussed and  Stacey Taylor agreed to the above obesity treatment plan.  I, Doreene Nest, am acting as transcriptionist for Eber Jones, MD  I have reviewed the above documentation for accuracy and completeness, and I agree with the above. - Ilene Qua, MD

## 2017-12-31 ENCOUNTER — Ambulatory Visit (INDEPENDENT_AMBULATORY_CARE_PROVIDER_SITE_OTHER): Payer: 59 | Admitting: Obstetrics and Gynecology

## 2017-12-31 ENCOUNTER — Encounter: Payer: Self-pay | Admitting: Obstetrics and Gynecology

## 2017-12-31 VITALS — BP 128/82 | HR 78 | Ht 60.0 in | Wt 242.0 lb

## 2017-12-31 DIAGNOSIS — N939 Abnormal uterine and vaginal bleeding, unspecified: Secondary | ICD-10-CM

## 2017-12-31 NOTE — Progress Notes (Signed)
Obstetrics & Gynecology Office Visit   Chief Complaint:  Chief Complaint  Patient presents with  . Follow-up    medication follow up    History of Present Illness: 37 y.o. [id number] presenting for medication follow up.  She is currently being managed with norethindrone.   The patient reports good control of symptoms on her current regimen.  On her current medication regimen the patient has achieved amenorrhea.   She has not noted any side-effects or new symptoms.  She has previously tried [employer].  She is happy with the menstrual suppression on norethindrone and wishes to continued at this time.     Review of Systems: Review of Systems  Constitutional: Negative for chills and fever.  Gastrointestinal: Negative for abdominal pain.  Endo/Heme/Allergies: Does not bruise/bleed easily.     Past Medical History:  Past Medical History:  Diagnosis Date  . Abnormal Pap smear of anus    HGSIL  . Anemia   . Back pain   . Cervical dysplasia    CIN III  . Endometriosis   . Gallbladder problem   . Headache   . Heart murmur   . Hypertension   . Leg edema   . Obesity   . Obesity   . Premature delivery    x2    Past Surgical History:  Past Surgical History:  Procedure Laterality Date  . CERVICAL BIOPSY  W/ LOOP ELECTRODE EXCISION  11/2005  . CESAREAN SECTION  2008/2015  . CESAREAN SECTION WITH BILATERAL TUBAL LIGATION  2015  . CHOLECYSTECTOMY  2003  . COLPOSCOPY  2005, 2006  . COMBINED HYSTEROSCOPY DIAGNOSTIC / D&C  2014  . CORONARY ARTERY BYPASS GRAFT      Gynecologic History: No LMP recorded. Patient is not currently having periods (Reason: Irregular Periods).  Obstetric History: [id number]  Family History:  Family History  Adopted: Yes  Family history unknown: Yes    Social History:  Social History   Socioeconomic History  . Marital status: Married    Spouse name: Christia Reading  . Number of children: 5  . Years of education: Not on file  . Highest  education level: Not on file  Social Needs  . Financial resource strain: Not on file  . Food insecurity - worry: Not on file  . Food insecurity - inability: Not on file  . Transportation needs - medical: Not on file  . Transportation needs - non-medical: Not on file  Occupational History  . Occupation: CNA  Tobacco Use  . Smoking status: Never Smoker  . Smokeless tobacco: Never Used  Substance and Sexual Activity  . Alcohol use: No    Alcohol/week: 0.0 oz  . Drug use: No  . Sexual activity: Yes    Birth control/protection: Pill  Other Topics Concern  . Not on file  Social History Narrative  . Not on file    Allergies:  Allergies  Allergen Reactions  . Latex Hives    Medications: Prior to Admission medications   Medication Sig Start Date End Date Taking? Authorizing Provider  cyclobenzaprine (FLEXERIL) 5 MG tablet 1 tablet every 8 hours as needed for muscle spasms 07/30/17   Olin Hauser, DO  metFORMIN (GLUCOPHAGE) 500 MG tablet Take 1 tablet (500 mg total) by mouth daily with breakfast. 12/24/17   Eber Jones, MD  norethindrone (AYGESTIN) 5 MG tablet Take 1 tablet (5 mg total) by mouth daily. 10/29/17   Malachy Mood, MD  vitamin B-12 (CYANOCOBALAMIN) 1000 MCG  tablet Take 2 tablets (2,000 mcg total) by mouth daily. 12/24/17   Eber Jones, MD  Vitamin D, Ergocalciferol, (DRISDOL) 50000 units CAPS capsule Take 1 capsule (50,000 Units total) by mouth every 7 (seven) days. 12/24/17   Eber Jones, MD    Physical Exam Vitals:  Vitals:   12/31/17 0905  BP: 128/82  Pulse: 78   No LMP recorded. Patient is not currently having periods (Reason: Irregular Periods).  General: NAD HEENT: normocephalic, anicteric Pulmonary: No increased work of breathing Neurologic: Grossly intact Psychiatric: mood appropriate, affect full  Recent Results (from the past 2160 hour(s))  Vitamin B12     Status: Abnormal   Collection Time: 12/10/17 11:17  AM  Result Value Ref Range   Vitamin B-12 <150 (L) 232 - 1245 pg/mL  CBC With Differential     Status: None   Collection Time: 12/10/17 11:17 AM  Result Value Ref Range   WBC 8.3 3.4 - 10.8 x10E3/uL   RBC 4.34 3.77 - 5.28 x10E6/uL   Hemoglobin 12.2 11.1 - 15.9 g/dL   Hematocrit 37.0 34.0 - 46.6 %   MCV 85 79 - 97 fL   MCH 28.1 26.6 - 33.0 pg   MCHC 33.0 31.5 - 35.7 g/dL   RDW 13.5 12.3 - 15.4 %   Neutrophils 64 Not Estab. %   Lymphs 30 Not Estab. %   Monocytes 5 Not Estab. %   Eos 1 Not Estab. %   Basos 0 Not Estab. %   Neutrophils Absolute 5.3 1.4 - 7.0 x10E3/uL   Lymphocytes Absolute 2.5 0.7 - 3.1 x10E3/uL   Monocytes Absolute 0.4 0.1 - 0.9 x10E3/uL   EOS (ABSOLUTE) 0.1 0.0 - 0.4 x10E3/uL   Basophils Absolute 0.0 0.0 - 0.2 x10E3/uL   Immature Granulocytes 0 Not Estab. %   Immature Grans (Abs) 0.0 0.0 - 0.1 x10E3/uL  Comprehensive metabolic panel     Status: Abnormal   Collection Time: 12/10/17 11:17 AM  Result Value Ref Range   Glucose 74 65 - 99 mg/dL   BUN 8 6 - 20 mg/dL   Creatinine, Ser 0.54 (L) 0.57 - 1.00 mg/dL   GFR calc non Af Amer 122 >59 mL/min/1.73   GFR calc Af Amer 140 >59 mL/min/1.73   BUN/Creatinine Ratio 15 9 - 23   Sodium 140 134 - 144 mmol/L   Potassium 3.8 3.5 - 5.2 mmol/L   Chloride 103 96 - 106 mmol/L   CO2 23 20 - 29 mmol/L   Calcium 8.7 8.7 - 10.2 mg/dL   Total Protein 7.4 6.0 - 8.5 g/dL   Albumin 3.9 3.5 - 5.5 g/dL   Globulin, Total 3.5 1.5 - 4.5 g/dL   Albumin/Globulin Ratio 1.1 (L) 1.2 - 2.2   Bilirubin Total 0.3 0.0 - 1.2 mg/dL   Alkaline Phosphatase 53 39 - 117 IU/L   AST 11 0 - 40 IU/L   ALT 15 0 - 32 IU/L  Folate     Status: None   Collection Time: 12/10/17 11:17 AM  Result Value Ref Range   Folate 4.5 >3.0 ng/mL    Comment: A serum folate concentration of less than 3.1 ng/mL is considered to represent clinical deficiency.   Hemoglobin A1c     Status: Abnormal   Collection Time: 12/10/17 11:17 AM  Result Value Ref Range   Hgb  A1c MFr Bld 5.8 (H) 4.8 - 5.6 %    Comment:          Prediabetes: 5.7 -  6.4          Diabetes: >6.4          Glycemic control for adults with diabetes: <7.0    Est. average glucose Bld gHb Est-mCnc 120 mg/dL  Insulin, random     Status: Abnormal   Collection Time: 12/10/17 11:17 AM  Result Value Ref Range   INSULIN 39.7 (H) 2.6 - 24.9 uIU/mL  Lipid Panel With LDL/HDL Ratio     Status: Abnormal   Collection Time: 12/10/17 11:17 AM  Result Value Ref Range   Cholesterol, Total 154 100 - 199 mg/dL   Triglycerides 43 0 - 149 mg/dL   HDL 35 (L) >39 mg/dL   VLDL Cholesterol Cal 9 5 - 40 mg/dL   LDL Calculated 110 (H) 0 - 99 mg/dL   LDl/HDL Ratio 3.1 0.0 - 3.2 ratio    Comment:                                     LDL/HDL Ratio                                             Men  Women                               1/2 Avg.Risk  1.0    1.5                                   Avg.Risk  3.6    3.2                                2X Avg.Risk  6.2    5.0                                3X Avg.Risk  8.0    6.1   T3     Status: None   Collection Time: 12/10/17 11:17 AM  Result Value Ref Range   T3, Total 163 71 - 180 ng/dL  T4, free     Status: None   Collection Time: 12/10/17 11:17 AM  Result Value Ref Range   Free T4 1.35 0.82 - 1.77 ng/dL  TSH     Status: None   Collection Time: 12/10/17 11:17 AM  Result Value Ref Range   TSH 1.900 0.450 - 4.500 uIU/mL  VITAMIN D 25 Hydroxy (Vit-D Deficiency, Fractures)     Status: Abnormal   Collection Time: 12/10/17 11:17 AM  Result Value Ref Range   Vit D, 25-Hydroxy 12.7 (L) 30.0 - 100.0 ng/mL    Comment: Vitamin D deficiency has been defined by the [place] and an Endocrine Society practice guideline as a level of serum 25-OH vitamin D less than 20 ng/mL (1,2). The Endocrine Society went on to further define vitamin D insufficiency as a level between 21 and 29 ng/mL (2). 1. IOM (Institute of Medicine). 2010. Dietary reference    intakes  for calcium and D. [place]: The    [employer]. 2. Holick MF,  Binkley [place], Bischoff-Ferrari HA, et al.    Evaluation, treatment, and prevention of vitamin D    deficiency: an Endocrine Society clinical practice    guideline. JCEM. 2011 Jul; 96(7):1911-30.      Assessment: 37 y.o. [id number] abnormal uterine bleeding  Plan: Problem List Items Addressed This Visit    None    Visit Diagnoses    Abnormal uterine bleeding    -  Primary      1) Abnormal uterine bleeding - likely secondary to LoLoestrin Fe given low dose we had discussed that it does not achieve the best cycle control and irregular menstrual cycles or even absence of withdrawal bleeds are common.  Given some cramping we opted for menstrual cycle suppression and coverage for possible endometriosis or adenomyosis with norethindrone.  She has done well since starting this regimen and has achieve amenorrhea.  We discussed alterative if she should develop further menstrual cycle issues.  2) A total of 15 minutes were spent in face-to-face contact with the patient during this encounter with over half of that time devoted to counseling and coordination of care.  3) Return in about 1 year (around 12/31/2018) for annual.   Malachy Mood, MD, [place] 12/31/2017, 1:21 PM    Continue norethindrone

## 2018-01-07 ENCOUNTER — Encounter (INDEPENDENT_AMBULATORY_CARE_PROVIDER_SITE_OTHER): Payer: Self-pay

## 2018-01-07 ENCOUNTER — Ambulatory Visit (INDEPENDENT_AMBULATORY_CARE_PROVIDER_SITE_OTHER): Payer: 59 | Admitting: Family Medicine

## 2018-01-11 ENCOUNTER — Ambulatory Visit (INDEPENDENT_AMBULATORY_CARE_PROVIDER_SITE_OTHER): Payer: 59 | Admitting: Family Medicine

## 2018-01-11 ENCOUNTER — Encounter: Payer: Self-pay | Admitting: Family Medicine

## 2018-01-11 VITALS — BP 138/80 | HR 87 | Temp 98.7°F | Resp 16 | Ht 60.0 in | Wt 242.6 lb

## 2018-01-11 DIAGNOSIS — R7303 Prediabetes: Secondary | ICD-10-CM | POA: Diagnosis not present

## 2018-01-11 DIAGNOSIS — I10 Essential (primary) hypertension: Secondary | ICD-10-CM | POA: Diagnosis not present

## 2018-01-11 DIAGNOSIS — E538 Deficiency of other specified B group vitamins: Secondary | ICD-10-CM | POA: Diagnosis not present

## 2018-01-11 DIAGNOSIS — E559 Vitamin D deficiency, unspecified: Secondary | ICD-10-CM | POA: Diagnosis not present

## 2018-01-11 MED ORDER — METFORMIN HCL 500 MG PO TABS: 500.0000 mg | ORAL_TABLET | Freq: Every day | ORAL | 2 refills | Status: DC

## 2018-01-11 MED ORDER — VITAMIN D (ERGOCALCIFEROL) 1.25 MG (50000 UNIT) PO CAPS: 50000.0000 [IU] | ORAL_CAPSULE | ORAL | 1 refills | Status: DC

## 2018-01-11 NOTE — Assessment & Plan Note (Signed)
Recent diagnosis A1c 5.8, from prior 5.4 Suspect related to elevated weight and increased Insulin per pancreas seems abnormal Concern with obesity, HTN, HLD  Plan:  1. Continue current therapy - Metformin 500mg  daily 2. Encourage improved lifestyle - low carb, low sugar diet, reduce portion size, improve regular exercise 3. Follow-up 2 months A1c

## 2018-01-11 NOTE — Progress Notes (Signed)
Subjective:    Patient ID: Stacey Taylor, female    DOB: May 11, 1981, 36 y.o.   MRN: [id number]  Stacey Taylor is a 37 y.o. female presenting on 01/11/2018 for Obesity (need some evalution regarding weight management)   HPI   Morbid Obesity BMI >47 / Abnormal WeightGain - Last visit with me for same topic, 11/05/17, treated with referral to [facility] to see Dr Leafy Ro, see prior notes for background information. - Interval update with she has seen them twice, and had extensive lab work-up and diet/nutrition counseling, ultimately she was dx with low Vitamin B12, low Vitamin D, and also elevated A1c 5.8 PreDM and elevated Insulin level, was advised her pancreas is incorrectly secreting too much insulin, she was given calorie restricted and carb restricted diet - Today patient reports that she no longer wishes to return to them for Weight Management, she states that she was not happy with the dietary advice and does not prefer to eat as much meat/protein, also she tried and cannot tolerate some of the artificial sweeteners among other foods - She also has complaint today about $25 late fee for 5 min late. She does not want to return. States has limitation with travel / financial as well. Does admit to limited exercise still, but often stays active with walking on job.  Vitamin D Deficiency / Vitamin B12 Deficiency - Recent labs 12/10/17 with Vitamin B12 <150, and Vitamin D 12.7. She has been taking Vitamin B12 OTC 1,000 mcg x 2 per dose for 2000 daily, and Vitamin D3 50,000 iu weekly rx for 1 month now  Pre-Diabetes: Reports recent concerns, with possible new diagnosis, prior trend A1c 5.4 to 5.8, see above on Weight, she was started on metformin due to elevated levels of Insulin per pancreas inappropriately CBGs: not checking Meds: Metformin 500mg  daily Reports  good compliance. Tolerating well w/o side-effects Denies hypoglycemia, polyuria, visual changes, numbness or  tingling.  CHRONIC HTN: Reports she is very stressed out right now due to several factors, Father passed away 03-Oct-2017, bad few months, difficulty with financial, and work. She has not been checking BP at home. Current Meds - None. No longer taking HCTZ in past, she has declined medicines before and prefers herbal Denies CP, dyspnea, HA, edema, dizziness / lightheadedness   Depression screen [facility] 2/9 12/10/2017 11/05/2017 07/30/2017  Decreased Interest 1 0 0  Down, Depressed, Hopeless 1 0 0  PHQ - 2 Score 2 0 0  Altered sleeping 1 - -  Tired, decreased energy 3 - -  Change in appetite 3 - -  Feeling bad or failure about yourself  0 - -  Trouble concentrating 0 - -  Moving slowly or fidgety/restless 0 - -  Suicidal thoughts 0 - -  PHQ-9 Score 9 - -  Difficult doing work/chores Not difficult at all - -    Social History   Tobacco Use  . Smoking status: Never Smoker  . Smokeless tobacco: Never Used  Substance Use Topics  . Alcohol use: No    Alcohol/week: 0.0 oz  . Drug use: No    Review of Systems Per HPI unless specifically indicated above     Objective:    BP 138/80 (BP Location: Left Arm, Cuff Size: Large)   Pulse 87   Temp 98.7 F (37.1 C) (Other (Comment))   Resp 16   Ht 5' (1.524 m)   Wt 242 lb 9.6 oz (110 kg)   BMI 47.38 kg/m  Wt Readings from Last 3 Encounters:  01/11/18 242 lb 9.6 oz (110 kg)  12/31/17 242 lb (109.8 kg)  12/24/17 241 lb (109.3 kg)    Physical Exam  Constitutional: She is oriented to person, place, and time. She appears well-developed and well-nourished. No distress.  Well-appearing, comfortable, cooperative, obese  HENT:  Head: Normocephalic and atraumatic.  Mouth/Throat: Oropharynx is clear and moist.  Eyes: Conjunctivae are normal. Right eye exhibits no discharge. Left eye exhibits no discharge.  Neck: Normal range of motion. Neck supple.  Cardiovascular: Normal rate, regular rhythm, normal heart sounds and intact distal pulses.   No murmur heard. Pulmonary/Chest: Effort normal and breath sounds normal. No respiratory distress. She has no wheezes. She has no rales.  Musculoskeletal: Normal range of motion. She exhibits no edema.  Lymphadenopathy:    She has no cervical adenopathy.  Neurological: She is alert and oriented to person, place, and time.  Skin: Skin is warm and dry. No rash noted. She is not diaphoretic. No erythema.  Psychiatric: She has a normal mood and affect. Her behavior is normal.  Well groomed, good eye contact, normal speech and thoughts  Nursing note and vitals reviewed.    Results for orders placed or performed in visit on 12/10/17  Vitamin B12  Result Value Ref Range   Vitamin B-12 <150 (L) 232 - 1245 pg/mL  CBC With Differential  Result Value Ref Range   WBC 8.3 3.4 - 10.8 x10E3/uL   RBC 4.34 3.77 - 5.28 x10E6/uL   Hemoglobin 12.2 11.1 - 15.9 g/dL   Hematocrit 37.0 34.0 - 46.6 %   MCV 85 79 - 97 fL   MCH 28.1 26.6 - 33.0 pg   MCHC 33.0 31.5 - 35.7 g/dL   RDW 13.5 12.3 - 15.4 %   Neutrophils 64 Not Estab. %   Lymphs 30 Not Estab. %   Monocytes 5 Not Estab. %   Eos 1 Not Estab. %   Basos 0 Not Estab. %   Neutrophils Absolute 5.3 1.4 - 7.0 x10E3/uL   Lymphocytes Absolute 2.5 0.7 - 3.1 x10E3/uL   Monocytes Absolute 0.4 0.1 - 0.9 x10E3/uL   EOS (ABSOLUTE) 0.1 0.0 - 0.4 x10E3/uL   Basophils Absolute 0.0 0.0 - 0.2 x10E3/uL   Immature Granulocytes 0 Not Estab. %   Immature Grans (Abs) 0.0 0.0 - 0.1 x10E3/uL  Comprehensive metabolic panel  Result Value Ref Range   Glucose 74 65 - 99 mg/dL   BUN 8 6 - 20 mg/dL   Creatinine, Ser 0.54 (L) 0.57 - 1.00 mg/dL   GFR calc non Af Amer 122 >59 mL/min/1.73   GFR calc Af Amer 140 >59 mL/min/1.73   BUN/Creatinine Ratio 15 9 - 23   Sodium 140 134 - 144 mmol/L   Potassium 3.8 3.5 - 5.2 mmol/L   Chloride 103 96 - 106 mmol/L   CO2 23 20 - 29 mmol/L   Calcium 8.7 8.7 - 10.2 mg/dL   Total Protein 7.4 6.0 - 8.5 g/dL   Albumin 3.9 3.5 - 5.5 g/dL    Globulin, Total 3.5 1.5 - 4.5 g/dL   Albumin/Globulin Ratio 1.1 (L) 1.2 - 2.2   Bilirubin Total 0.3 0.0 - 1.2 mg/dL   Alkaline Phosphatase 53 39 - 117 IU/L   AST 11 0 - 40 IU/L   ALT 15 0 - 32 IU/L  Folate  Result Value Ref Range   Folate 4.5 >3.0 ng/mL  Hemoglobin A1c  Result Value Ref Range  Hgb A1c MFr Bld 5.8 (H) 4.8 - 5.6 %   Est. average glucose Bld gHb Est-mCnc 120 mg/dL  Insulin, random  Result Value Ref Range   INSULIN 39.7 (H) 2.6 - 24.9 uIU/mL  Lipid Panel With LDL/HDL Ratio  Result Value Ref Range   Cholesterol, Total 154 100 - 199 mg/dL   Triglycerides 43 0 - 149 mg/dL   HDL 35 (L) >39 mg/dL   VLDL Cholesterol Cal 9 5 - 40 mg/dL   LDL Calculated 110 (H) 0 - 99 mg/dL   LDl/HDL Ratio 3.1 0.0 - 3.2 ratio  T3  Result Value Ref Range   T3, Total 163 71 - 180 ng/dL  T4, free  Result Value Ref Range   Free T4 1.35 0.82 - 1.77 ng/dL  TSH  Result Value Ref Range   TSH 1.900 0.450 - 4.500 uIU/mL  VITAMIN D 25 Hydroxy (Vit-D Deficiency, Fractures)  Result Value Ref Range   Vit D, 25-Hydroxy 12.7 (L) 30.0 - 100.0 ng/mL      Assessment & Plan:   Problem List Items Addressed This Visit    Essential hypertension    Elevated, on manual repeat improved, seems sub optimally controlled. Prior med non adherence No outside BP readings from home/work - advised to check again No known complications  Plan:  1. Discussion on weight management and lifestyle changes 1st - if BP still uncontrolled will need to re-address rx medicines 2. Encourage improved lifestyle - low sodium diet, regular exercise, wt loss 3. Start monitor BP outside office, bring readings to next visit, if persistently >140/90 or new symptoms notify office sooner 4. Follow-up 2 months HTN / weight      Obesity, Class III, BMI 40-49.9 (morbid obesity) ([place]) - Primary    Persistent chronic weight problem, still weight steady without loss No longer followed by [facility], seen x 2 visits then  she decided to stop going H/o difficulty losing wt, especially postpartum x 3. Other factors with poor lifestyle limited regular exercise  Continue to encourage goals for starting healthy lifestyle changes diet / exercise - focus on portion size and limit calorie intake Continue Metformin, vitamin supplements      Relevant Medications   metFORMIN (GLUCOPHAGE) 500 MG tablet   Pre-diabetes    Recent diagnosis A1c 5.8, from prior 5.4 Suspect related to elevated weight and increased Insulin per pancreas seems abnormal Concern with obesity, HTN, HLD  Plan:  1. Continue current therapy - Metformin 500mg  daily 2. Encourage improved lifestyle - low carb, low sugar diet, reduce portion size, improve regular exercise 3. Follow-up 2 months A1c      Relevant Orders   Hemoglobin A1c   Vitamin B12 deficiency    Low < 150 Continue 2,000 mcg daily OTC for 2 more months - may need inj in future      Relevant Orders   Vitamin B12   Vitamin D deficiency    Low 12.7 Continue Vitamin D3 50,000 iu weekly for 2 more months - rx sent      Relevant Medications   Vitamin D, Ergocalciferol, (DRISDOL) 50000 units CAPS capsule   Other Relevant Orders   VITAMIN D 25 Hydroxy (Vit-D Deficiency, Fractures)    Other Visit Diagnoses    Prediabetes       Relevant Medications   metFORMIN (GLUCOPHAGE) 500 MG tablet      Meds ordered this encounter  Medications  . Vitamin D, Ergocalciferol, (DRISDOL) 50000 units CAPS capsule  Sig: Take 1 capsule (50,000 Units total) by mouth every 7 (seven) days.    Dispense:  4 capsule    Refill:  1  . metFORMIN (GLUCOPHAGE) 500 MG tablet    Sig: Take 1 tablet (500 mg total) by mouth daily with breakfast.    Dispense:  30 tablet    Refill:  2      Follow up plan: Return in about 2 months (around 03/13/2018) for PreDM, Weight, HTN, Lab results.  Future labs ordered for 03/09/18  Nobie Putnam, [facility] 01/11/2018, 8:55 AM

## 2018-01-11 NOTE — Assessment & Plan Note (Signed)
Persistent chronic weight problem, still weight steady without loss No longer followed by [facility], seen x 2 visits then she decided to stop going H/o difficulty losing wt, especially postpartum x 3. Other factors with poor lifestyle limited regular exercise  Continue to encourage goals for starting healthy lifestyle changes diet / exercise - focus on portion size and limit calorie intake Continue Metformin, vitamin supplements

## 2018-01-11 NOTE — Assessment & Plan Note (Signed)
Low 12.7 Continue Vitamin D3 50,000 iu weekly for 2 more months - rx sent

## 2018-01-11 NOTE — Patient Instructions (Addendum)
Thank you for coming to the office today.  1.  Continue Metformin 500mg  daliy with food - sent refill Continue Vitamin D3 50,000 iu weekly - sent refill - for 2 more months Continue Vitamin B12 2,000 daily - OTC  Try to keep track of BP at home between now and next visit  We will check labs, a1c, vitamins insulin level in 2 months  DUE for FASTING BLOOD WORK (no food or drink after midnight before the lab appointment, only water or coffee without cream/sugar on the morning of)  SCHEDULE "Lab Only" visit in the morning at the clinic for lab draw in 2 MONTHS   - Make sure Lab Only appointment is at about 1 week before your next appointment, so that results will be available  For Lab Results, once available within 2-3 days of blood draw, you can can log in to MyChart online to view your results and a brief explanation. Also, we can discuss results at next follow-up visit.   Please schedule a Follow-up Appointment to: Return in about 2 months (around 03/13/2018) for PreDM, Weight, HTN, Lab results.  If you have any other questions or concerns, please feel free to call the office or send a message through [facility]. You may also schedule an earlier appointment if necessary.  Additionally, you may be receiving a survey about your experience at our office within a few days to 1 week by e-mail or mail. We value your feedback.  Nobie Putnam, DO [facility]

## 2018-01-11 NOTE — Assessment & Plan Note (Signed)
Low < 150 Continue 2,000 mcg daily OTC for 2 more months - may need inj in future

## 2018-01-11 NOTE — Assessment & Plan Note (Signed)
Elevated, on manual repeat improved, seems sub optimally controlled. Prior med non adherence No outside BP readings from home/work - advised to check again No known complications  Plan:  1. Discussion on weight management and lifestyle changes 1st - if BP still uncontrolled will need to re-address rx medicines 2. Encourage improved lifestyle - low sodium diet, regular exercise, wt loss 3. Start monitor BP outside office, bring readings to next visit, if persistently >140/90 or new symptoms notify office sooner 4. Follow-up 2 months HTN / weight

## 2018-01-12 ENCOUNTER — Other Ambulatory Visit: Payer: Self-pay | Admitting: *Deleted

## 2018-02-04 ENCOUNTER — Ambulatory Visit (INDEPENDENT_AMBULATORY_CARE_PROVIDER_SITE_OTHER): Payer: 59

## 2018-02-04 ENCOUNTER — Other Ambulatory Visit: Payer: Self-pay

## 2018-02-04 DIAGNOSIS — R0602 Shortness of breath: Secondary | ICD-10-CM

## 2018-03-04 ENCOUNTER — Ambulatory Visit: Payer: 59 | Admitting: Family Medicine

## 2018-03-09 ENCOUNTER — Other Ambulatory Visit: Payer: 59

## 2018-03-09 ENCOUNTER — Other Ambulatory Visit: Payer: Self-pay | Admitting: *Deleted

## 2018-03-09 DIAGNOSIS — R7303 Prediabetes: Secondary | ICD-10-CM

## 2018-03-09 DIAGNOSIS — E559 Vitamin D deficiency, unspecified: Secondary | ICD-10-CM

## 2018-03-09 DIAGNOSIS — E538 Deficiency of other specified B group vitamins: Secondary | ICD-10-CM | POA: Diagnosis not present

## 2018-03-09 DIAGNOSIS — R7309 Other abnormal glucose: Secondary | ICD-10-CM | POA: Diagnosis not present

## 2018-03-09 NOTE — Progress Notes (Signed)
o

## 2018-03-10 LAB — VITAMIN B12: VITAMIN B 12: 238 pg/mL (ref 200–1100)

## 2018-03-10 LAB — HEMOGLOBIN A1C
EAG (MMOL/L): 6.3 (calc)
Hgb A1c MFr Bld: 5.6 % of total Hgb (ref <5.7)
Mean Plasma Glucose: 114 (calc)

## 2018-03-10 LAB — VITAMIN D 25 HYDROXY (VIT D DEFICIENCY, FRACTURES): VIT D 25 HYDROXY: 35 ng/mL (ref 30–100)

## 2018-03-10 LAB — INSULIN, RANDOM: Insulin: 18.5 u[IU]/mL (ref 2.0–19.6)

## 2018-03-11 ENCOUNTER — Ambulatory Visit (INDEPENDENT_AMBULATORY_CARE_PROVIDER_SITE_OTHER): Payer: 59 | Admitting: Family Medicine

## 2018-03-11 ENCOUNTER — Encounter: Payer: Self-pay | Admitting: Family Medicine

## 2018-03-11 ENCOUNTER — Other Ambulatory Visit: Payer: Self-pay | Admitting: Family Medicine

## 2018-03-11 VITALS — BP 136/82 | HR 82 | Temp 98.7°F | Resp 16 | Ht 60.0 in | Wt 239.0 lb

## 2018-03-11 DIAGNOSIS — E66813 Obesity, class 3: Secondary | ICD-10-CM

## 2018-03-11 DIAGNOSIS — I10 Essential (primary) hypertension: Secondary | ICD-10-CM

## 2018-03-11 DIAGNOSIS — R7303 Prediabetes: Secondary | ICD-10-CM

## 2018-03-11 DIAGNOSIS — E559 Vitamin D deficiency, unspecified: Secondary | ICD-10-CM

## 2018-03-11 DIAGNOSIS — D649 Anemia, unspecified: Secondary | ICD-10-CM

## 2018-03-11 DIAGNOSIS — B351 Tinea unguium: Secondary | ICD-10-CM | POA: Insufficient documentation

## 2018-03-11 DIAGNOSIS — R5383 Other fatigue: Secondary | ICD-10-CM

## 2018-03-11 DIAGNOSIS — E538 Deficiency of other specified B group vitamins: Secondary | ICD-10-CM

## 2018-03-11 DIAGNOSIS — Z0000 Encounter for general adult medical examination without abnormal findings: Secondary | ICD-10-CM

## 2018-03-11 DIAGNOSIS — E786 Lipoprotein deficiency: Secondary | ICD-10-CM

## 2018-03-11 DIAGNOSIS — R600 Localized edema: Secondary | ICD-10-CM

## 2018-03-11 MED ORDER — TERBINAFINE HCL 250 MG PO TABS: 250.0000 mg | ORAL_TABLET | Freq: Every day | ORAL | 1 refills | Status: DC

## 2018-03-11 NOTE — Assessment & Plan Note (Addendum)
Persistent chronic weight problem Some improved weight loss now, also difficulty with fluid weight fluctuating No longer followed by [facility], seen x 2 visits then she decided to stop going H/o difficulty losing wt, especially postpartum x 3. Other factors with poor lifestyle limited regular exercise  Continue to encourage goals for starting healthy lifestyle changes diet / exercise - focus on portion size and limit calorie intake, keep track of intake as she is monitoring now Recommend increase regular exercise A1c improved, now off Metformin discussed possibility of getting GLP1 weekly injectable for A1c control and weight loss primarily in future, may be short term

## 2018-03-11 NOTE — Assessment & Plan Note (Signed)
Improvement to A1c 5.6, now below range, from last 5.8. With improved diet - despite non adherence to Metformin Suspect related to elevated weight and increased Insulin per pancreas seems abnormal Now repeat insulin level is improved back to normal Concern with obesity, HTN, HLD  Plan:  1. DISCONTINUE Metformin 500mg  daily - she is not taking it regularly anyway by her report due to unable to remember pill, agree to keep on lifestyle improvement wt loss for now instead 2. Encourage improved lifestyle - low carb, low sugar diet, reduce portion size, improve regular exercise 3. Follow-up 4 months Annual + A1c - anticipate in future if need med again can use Metformin XR 500 option for better coverage, or discussed possibility of getting GLP1 weekly injectable for A1c control and weight loss primarily in future, may be short term

## 2018-03-11 NOTE — Assessment & Plan Note (Signed)
Improved, Vit D 35 now from 12.7 S/p 50k weekly for 3 months Start OTC Vitamin D3 1,000 go 2,000 iu daily for maintenance

## 2018-03-11 NOTE — Assessment & Plan Note (Signed)
Improved control with some weight loss, and less stress currently No outside BP readings from home/work - advised to check again No known complications  Plan:  1. Remain off medicine currently as long as keep losing weight and improved lifestyle 2. Encourage improved lifestyle - low sodium diet, regular exercise, wt loss 3. Start monitor BP outside office, bring readings to next visit, if persistently >140/90 or new symptoms notify office sooner 4. Follow-up 4 months HTN / weight / annual / labs

## 2018-03-11 NOTE — Assessment & Plan Note (Addendum)
Suspected onychomycosis fungal infection chronic L 2nd toenail, now developing R 2nd toenail Past treatment topiocal and short term oral therapy failed years ago, suspect not appropriate duration Possible could be other etiology of toenail change, no other skin or other nail changes to suggest vascular etiology  Plan Repeat trial for fungal infection - sent Terbinafine 250mg  daily x 6 weeks, x 1 refill for 6 more weeks if unresolved Follow-up - can refer to Podiatry if needed

## 2018-03-11 NOTE — Progress Notes (Signed)
Subjective:    Patient ID: Stacey Taylor, female    DOB: [birth year], 36 y.o.   MRN: [id number]  Stacey Taylor is a 37 y.o. female presenting on 03/11/2018 for prediabetes and Obesity   HPI   FOLLOW-UP Morbid Obesity BMI >46 / Abnormal WeightGain / LE Edema - Last visit with me for same topic, 01/2018, had been followed in past by [facility] but then patient has decided to stop f/u with them and return here, has requested repeat lab series, see prior notes for background information. - Today reports overall thinks she has improved her lifestyle diet, now limiting portions and eating high protein appropriate diet, keeps track on app, she has lost some weight but it seems to fluctuate, has "water weight" with history of LE edema and swelling, states can fluctuate 2-4 lbs in 24 hours, she thinks due to swelling, but she has had prior ECHO in past that was normal as of 02/2018 - Down about 3 lbs in 3 months - Does admit to limited exercisestill, but often stays active with walking on job.  Vitamin D Deficiency / Vitamin B12 Deficiency - Interval update, recent labs show improvement on both now back in normal range. - Today she has finished Vit D3 50k weekly for 3 months - Now she is still taking B12 oral 1,000 mcg daily, asking if can take MVI or needs additional supplement  Pre-Diabetes: Since last visit, prior A1c up to 5.8, she has improved diet, see above. She has not been taking Metformin regularly, forgets to take most days, and has since stopped in general - Recent labs now improved A1c 5.6 from last 5.8. Also her random insulin level is now reduced by half CBGs: not checking Meds: Metformin 500mg  daily (not taking) Reports poor compliance. She was tolerating well w/o side-effects Denies hypoglycemia, polyuria, visual changes, numbness or tingling.  CHRONIC HTN: Improved BP, remains off medicine Current Meds - None Denies CP, dyspnea, HA, edema, dizziness /  lightheadedness  Onychomycosis Bilateral 2nd toenail, thickening appearance L chronic for many years, prior treatment failed only short course. Now new problem R 2nd toenail starting over past 4 weeks. Asking if blood flow or if fungal infection Denies pain redness swelling or drainage  History of Anemia - Last CBC showed normal Hemoglobin in 12/2017, she has prior history of anemia, asking if she needs iron pill and can have other anemia labs Admits some tired or fatigue at times  Depression screen [facility] 2/9 03/11/2018 12/10/2017 11/05/2017  Decreased Interest 0 1 0  Down, Depressed, Hopeless 0 1 0  PHQ - 2 Score 0 2 0  Altered sleeping 0 1 -  Tired, decreased energy 0 3 -  Change in appetite 0 3 -  Feeling bad or failure about yourself  0 0 -  Trouble concentrating 0 0 -  Moving slowly or fidgety/restless 0 0 -  Suicidal thoughts 0 0 -  PHQ-9 Score 0 9 -  Difficult doing work/chores Not difficult at all Not difficult at all -    Social History   Tobacco Use  . Smoking status: Never Smoker  . Smokeless tobacco: Never Used  Substance Use Topics  . Alcohol use: No    Alcohol/week: 0.0 oz  . Drug use: No    Review of Systems Per HPI unless specifically indicated above     Objective:    BP 136/82   Pulse 82   Temp 98.7 F (37.1 C) (Oral)  Resp 16   Ht 5' (1.524 m)   Wt 239 lb (108.4 kg)   BMI 46.68 kg/m   Wt Readings from Last 3 Encounters:  03/11/18 239 lb (108.4 kg)  01/11/18 242 lb 9.6 oz (110 kg)  12/31/17 242 lb (109.8 kg)    Physical Exam  Constitutional: She is oriented to person, place, and time. She appears well-developed and well-nourished. No distress.  Well-appearing, comfortable, cooperative, obese  HENT:  Head: Normocephalic and atraumatic.  Mouth/Throat: Oropharynx is clear and moist.  Eyes: Conjunctivae are normal. Right eye exhibits no discharge. Left eye exhibits no discharge.  Neck: Normal range of motion. Neck supple. No thyromegaly present.    Cardiovascular: Normal rate, regular rhythm, normal heart sounds and intact distal pulses.  No murmur heard. Pulmonary/Chest: Effort normal and breath sounds normal. No respiratory distress. She has no wheezes. She has no rales.  Musculoskeletal: Normal range of motion. She exhibits no edema (Improved, non pitting edema lower extremity).  Lymphadenopathy:    She has no cervical adenopathy.  Neurological: She is alert and oriented to person, place, and time.  Skin: Skin is warm and dry. No rash noted. She is not diaphoretic. No erythema.  L 2nd toe with thickening abnormal coloration for years, now new problem over >4 weeks on R foot 2nd toe as well early changes  Psychiatric: She has a normal mood and affect. Her behavior is normal.  Well groomed, good eye contact, normal speech and thoughts  Nursing note and vitals reviewed.  Results for orders placed or performed in visit on 03/09/18  Vitamin B12  Result Value Ref Range   Vitamin B-12 238 200 - 1,100 pg/mL  VITAMIN D 25 Hydroxy (Vit-D Deficiency, Fractures)  Result Value Ref Range   Vit D, 25-Hydroxy 35 30 - 100 ng/mL  Hemoglobin A1c  Result Value Ref Range   Hgb A1c MFr Bld 5.6 <5.7 % of total Hgb   Mean Plasma Glucose 114 (calc)   eAG (mmol/L) 6.3 (calc)  Insulin, random  Result Value Ref Range   Insulin 18.5 2.0 - 19.6 uIU/mL      Assessment & Plan:   Problem List Items Addressed This Visit    Essential hypertension    Improved control with some weight loss, and less stress currently No outside BP readings from home/work - advised to check again No known complications  Plan:  1. Remain off medicine currently as long as keep losing weight and improved lifestyle 2. Encourage improved lifestyle - low sodium diet, regular exercise, wt loss 3. Start monitor BP outside office, bring readings to next visit, if persistently >140/90 or new symptoms notify office sooner 4. Follow-up 4 months HTN / weight / annual / labs       Obesity, Class III, BMI 40-49.9 (morbid obesity) ([facility])    Persistent chronic weight problem Some improved weight loss now, also difficulty with fluid weight fluctuating No longer followed by [facility], seen x 2 visits then she decided to stop going H/o difficulty losing wt, especially postpartum x 3. Other factors with poor lifestyle limited regular exercise  Continue to encourage goals for starting healthy lifestyle changes diet / exercise - focus on portion size and limit calorie intake, keep track of intake as she is monitoring now Recommend increase regular exercise A1c improved, now off Metformin discussed possibility of getting GLP1 weekly injectable for A1c control and weight loss primarily in future, may be short term      Onychomycosis  Suspected onychomycosis fungal infection chronic L 2nd toenail, now developing R 2nd toenail Past treatment topiocal and short term oral therapy failed years ago, suspect not appropriate duration Possible could be other etiology of toenail change, no other skin or other nail changes to suggest vascular etiology  Plan Repeat trial for fungal infection - sent Terbinafine 250mg  daily x 6 weeks, x 1 refill for 6 more weeks if unresolved Follow-up - can refer to Podiatry if needed      Relevant Medications   terbinafine (LAMISIL) 250 MG tablet   Pre-diabetes - Primary    Improvement to A1c 5.6, now below range, from last 5.8. With improved diet - despite non adherence to Metformin Suspect related to elevated weight and increased Insulin per pancreas seems abnormal Now repeat insulin level is improved back to normal Concern with obesity, HTN, HLD  Plan:  1. DISCONTINUE Metformin 500mg  daily - she is not taking it regularly anyway by her report due to unable to remember pill, agree to keep on lifestyle improvement wt loss for now instead 2. Encourage improved lifestyle - low carb, low sugar diet, reduce portion size, improve regular  exercise 3. Follow-up 4 months Annual + A1c - anticipate in future if need med again can use Metformin XR 500 option for better coverage, or discussed possibility of getting GLP1 weekly injectable for A1c control and weight loss primarily in future, may be short term      Vitamin B12 deficiency    Improved now back up to 238 from < 150 Continue maintenance B12 oral 1,000 mcg daily maintenance      Vitamin D deficiency    Improved, Vit D 35 now from 12.7 S/p 50k weekly for 3 months Start OTC Vitamin D3 1,000 go 2,000 iu daily for maintenance          Meds ordered this encounter  Medications  . terbinafine (LAMISIL) 250 MG tablet    Sig: Take 1 tablet (250 mg total) by mouth daily. For 6 weeks, then repeat course for 6 more weeks if unresolved    Dispense:  45 tablet    Refill:  1    Follow up plan: Return in about 4 months (around 07/12/2018) for Annual Physical.  Future labs ordered for 07/08/18  Nobie Putnam, [facility] 03/11/2018, 10:59 AM

## 2018-03-11 NOTE — Assessment & Plan Note (Signed)
Improved now back up to 238 from < 150 Continue maintenance B12 oral 1,000 mcg daily maintenance

## 2018-03-11 NOTE — Patient Instructions (Addendum)
Thank you for coming to the office today.  Stop Metformin as discussed, since not taking it regularly, and sugar has improved.  A1c 5.6, and insulin level is lower, great job  May take Vitamin D3 1,000 to 2,000 iu daily for maintenance  Take Terbinafine 250mg  daily for 12 weeks - take 1st rx for 6 weeks, then repeat refill  In future we can refer you to Vascular if needed for swelling  Call insurance find cost and coverage of the following  1. Ozempic (Semaglutide injection) - start 0.25mg  weekly for 4 weeks then increase to 0.5mg  weekly - This one has best benefit of weight loss and reducing Cardiovascular events  2. Bydureon BCise (Exenatide ER) - once weekly - this is my preference, very good medicine well tolerated, less side effects of nausea, upset stomach. No dose changes. Cost and coverage is the problem, but we may be able to get it with the coupon card  3. Trulicity (Dulaglutide) - once weekly - this is very good one, usually one of my top choices as well, two doses, 0.75 (likely we would start) and 1.5 max dose. We can use coupon card here too  4. Victoza (Liraglutide) - once DAILY - 3 dose changes 0.6, 1.2 and 1.8, side effects nausea, upset stomach higher on this one but it is still very effective medicine  DUE for FASTING BLOOD WORK (no food or drink after midnight before the lab appointment, only water or coffee without cream/sugar on the morning of)  SCHEDULE "Lab Only" visit in the morning at the clinic for lab draw in 4 MONTHS   - Make sure Lab Only appointment is at about 1 week before your next appointment, so that results will be available  For Lab Results, once available within 2-3 days of blood draw, you can can log in to MyChart online to view your results and a brief explanation. Also, we can discuss results at next follow-up visit.   Please schedule a Follow-up Appointment to: Return in about 4 months (around 07/12/2018) for Annual Physical.  If you have  any other questions or concerns, please feel free to call the office or send a message through [place]. You may also schedule an earlier appointment if necessary.  Additionally, you may be receiving a survey about your experience at our office within a few days to 1 week by e-mail or mail. We value your feedback.  Nobie Putnam, DO [place]

## 2018-05-04 ENCOUNTER — Ambulatory Visit (INDEPENDENT_AMBULATORY_CARE_PROVIDER_SITE_OTHER): Payer: 59 | Admitting: Family Medicine

## 2018-05-04 ENCOUNTER — Encounter: Payer: Self-pay | Admitting: Family Medicine

## 2018-05-04 VITALS — BP 151/90 | HR 76 | Temp 98.2°F | Resp 16 | Ht 60.0 in | Wt 240.8 lb

## 2018-05-04 DIAGNOSIS — S40922A Unspecified superficial injury of left upper arm, initial encounter: Secondary | ICD-10-CM

## 2018-05-04 DIAGNOSIS — W57XXXA Bitten or stung by nonvenomous insect and other nonvenomous arthropods, initial encounter: Secondary | ICD-10-CM | POA: Diagnosis not present

## 2018-05-04 NOTE — Progress Notes (Addendum)
Subjective:    Patient ID: Stacey Taylor, female    DOB: [birth year], 37 y.o.   MRN: [id number]  Stacey Taylor is a 37 y.o. female presenting on 05/04/2018 for Insect Bite (onset yesterday tender to touch)  Patient presents for a same day appointment.  HPI   BUG BITE, Right upper arm. Reports new acute problem, she suspects a bug bite on Right upper arm last night first noticed 8pm, no specific exposures that she knows of and did not notice a bug, felt a sharp pain and had a red spot sore on inner part of elbow in crease skin fold. She did not put any topical treatments on it or take any medicine. She has some pain over this site and it seems to radiate out from this spot somewhat down into forearm few inches down. Otherwise no spreading redness or skin changes, no drainage of pus or bleeding She has had prior mosquito bites but this is more painful not itchy and swollen Denies fevers chills other rash or bite, nausea vomiting, itching   Depression screen [facility] 2/9 03/11/2018 12/10/2017 11/05/2017  Decreased Interest 0 1 0  Down, Depressed, Hopeless 0 1 0  PHQ - 2 Score 0 2 0  Altered sleeping 0 1 -  Tired, decreased energy 0 3 -  Change in appetite 0 3 -  Feeling bad or failure about yourself  0 0 -  Trouble concentrating 0 0 -  Moving slowly or fidgety/restless 0 0 -  Suicidal thoughts 0 0 -  PHQ-9 Score 0 9 -  Difficult doing work/chores Not difficult at all Not difficult at all -    Social History   Tobacco Use  . Smoking status: Never Smoker  . Smokeless tobacco: Never Used  Substance Use Topics  . Alcohol use: No    Alcohol/week: 0.0 oz  . Drug use: No    Review of Systems Per HPI unless specifically indicated above     Objective:    BP (!) 151/90   Pulse 76   Temp 98.2 F (36.8 C) (Oral)   Resp 16   Ht 5' (1.524 m)   Wt 240 lb 12.8 oz (109.2 kg)   BMI 47.03 kg/m   Wt Readings from Last 3 Encounters:  05/04/18 240 lb 12.8 oz (109.2 kg)  03/11/18 239 lb  (108.4 kg)  01/11/18 242 lb 9.6 oz (110 kg)    Physical Exam  Constitutional: She is oriented to person, place, and time. She appears well-developed and well-nourished. No distress.  Neurological: She is alert and oriented to person, place, and time.  Skin: Skin is warm and dry. She is not diaphoretic.  Right upper extremity Inner flexion crease of elbow Localized < 1 cm spot of localized erythema with slight break in skin mild tender, soft without induration or fluctuance, no drainage or bleeding, no extending erythema or other abnormality, no foreign body or insect  No other bites or evidence of rash  Nursing note and vitals reviewed.    Right inner aspect of elbow     Results for orders placed or performed in visit on 03/09/18  Vitamin B12  Result Value Ref Range   Vitamin B-12 238 200 - 1,100 pg/mL  VITAMIN D 25 Hydroxy (Vit-D Deficiency, Fractures)  Result Value Ref Range   Vit D, 25-Hydroxy 35 30 - 100 ng/mL  Hemoglobin A1c  Result Value Ref Range   Hgb A1c MFr Bld 5.6 <5.7 % of total Hgb  Mean Plasma Glucose 114 (calc)   eAG (mmol/L) 6.3 (calc)  Insulin, random  Result Value Ref Range   Insulin 18.5 2.0 - 19.6 uIU/mL      Assessment & Plan:   Problem List Items Addressed This Visit    None    Visit Diagnoses    Superficial injury of left upper arm, initial encounter    -  Primary   Bug bite, initial encounter          Clinically consistent with small bug bite lesion on Right arm, without evidence of secondary infection or complication Uncertain exact etiology, now about < 24 hours later no significant evidence of allergic reaction or other bites Not tried conservative therapy She was concerned about potential spider bite such as brown recluse and wanted to get it checked out Reassurance given today Recommend conservative care, routine wound care wash soap/water, use topical neosporin antibiotic prevent infection keep skin protected, ice packs for pain and  swelling PRN, Tylenol/ibuprofen as needed for pain Return precautions and close follow-up if any significant worsening or sign of superficial skin infection  No orders of the defined types were placed in this encounter.     Follow up plan: Return if symptoms worsen or fail to improve, for bug bite.  Nobie Putnam, [facility] 05/04/2018, 3:02 PM

## 2018-05-04 NOTE — Patient Instructions (Addendum)
Thank you for coming to the office today.  Uncertain exact type of bug that bit you, but perhaps spider vs wasp/bee  Start with topical antibiotic coverage (Neosporin or generic triple antibiotic) twice daily for 1 week, help it heal and prevent infection /    Use ice packs as needed to reduce pain and swelling  Can try topical icy hot in future if sore elbow  If not improving you may need to return for re-evaluation. But if more severe worsening such as spreading redness or streaking redness, significantly larger size, persistent drainage of pus, increased pain, fevers/chills, nausea vomiting. If significantly worse symptoms or most of these symptoms, would recommend going straight to Hospital Emergency Dept as you may require IV antibiotics instead.  Please schedule a Follow-up Appointment to: Return if symptoms worsen or fail to improve, for bug bite.  If you have any other questions or concerns, please feel free to call the office or send a message through [place]. You may also schedule an earlier appointment if necessary.  Additionally, you may be receiving a survey about your experience at our office within a few days to 1 week by e-mail or mail. We value your feedback.  Nobie Putnam, DO [place]

## 2018-05-13 DIAGNOSIS — H5203 Hypermetropia, bilateral: Secondary | ICD-10-CM | POA: Diagnosis not present

## 2018-05-13 DIAGNOSIS — H5213 Myopia, bilateral: Secondary | ICD-10-CM | POA: Diagnosis not present

## 2018-06-18 ENCOUNTER — Ambulatory Visit
Admission: EM | Admit: 2018-06-18 | Discharge: 2018-06-18 | Disposition: A | Discharge status: Home / Self Care | Payer: 59 | Attending: Family Medicine | Admitting: Family Medicine

## 2018-06-18 ENCOUNTER — Other Ambulatory Visit: Payer: Self-pay | Admitting: Family Medicine

## 2018-06-18 ENCOUNTER — Other Ambulatory Visit: Payer: Self-pay

## 2018-06-18 DIAGNOSIS — S90561A Insect bite (nonvenomous), right ankle, initial encounter: Secondary | ICD-10-CM | POA: Diagnosis not present

## 2018-06-18 DIAGNOSIS — S80869A Insect bite (nonvenomous), unspecified lower leg, initial encounter: Secondary | ICD-10-CM

## 2018-06-18 DIAGNOSIS — W57XXXA Bitten or stung by nonvenomous insect and other nonvenomous arthropods, initial encounter: Secondary | ICD-10-CM | POA: Diagnosis not present

## 2018-06-18 DIAGNOSIS — L0100 Impetigo, unspecified: Secondary | ICD-10-CM

## 2018-06-18 MED ORDER — TRIAMCINOLONE ACETONIDE 0.5 % EX CREA: 1.0000 "application " | TOPICAL_CREAM | Freq: Two times a day (BID) | CUTANEOUS | 0 refills | Status: DC

## 2018-06-18 MED ORDER — MUPIROCIN 2 % EX OINT: TOPICAL_OINTMENT | CUTANEOUS | 0 refills | Status: DC

## 2018-06-18 MED ORDER — HYDROXYZINE HCL 25 MG PO TABS: 25.0000 mg | ORAL_TABLET | Freq: Three times a day (TID) | ORAL | 0 refills | Status: DC | PRN

## 2018-06-18 NOTE — Discharge Instructions (Addendum)
Take medication as prescribed. Avoid scratching. Keep clean. Cool compresses.   Follow up with your primary care physician this week as needed. Return to Urgent care for new or worsening concerns.

## 2018-06-18 NOTE — ED Triage Notes (Signed)
Patient complains of ant bite to her right heel/ankle. Patient states that she thinks she may be allergic to them. Patient reports that area is itchy and painful.

## 2018-06-18 NOTE — Progress Notes (Signed)
Patient called with fire ant bites on lower legs, asking for something for itching. She was advised to use topical benadryl and new rx Triamcinolone 0.5% topical steroid BID for 1-2 weeks. We are booked today and no available appointments, she may go to urgent care if severe or not improving.  Stacey Taylor, [facility] 06/18/2018, 12:33 PM

## 2018-06-18 NOTE — ED Provider Notes (Addendum)
[facility] ____________________________________________  Time seen: Approximately 7:30 PM  I have reviewed the triage vital signs and the nursing notes.   HISTORY  Chief Complaint Insect Bite   HPI Stacey Taylor is a 37 y.o. female presenting for evaluation of ant bites to right ankle that occurred on Wednesday evening.  States that she notices the aunt as she killed a few.  States the area has been very itchy and having some burning to.  States that she normally has some bilateral lower leg swelling at the end of the day and has swelling to that area.  States that she continues to scratch it.  Has taken oral Benadryl without much change.  Reports tetanus immunization is up-to-date and within 10 years.  Denies any discomfort radiation, fall, injury or trauma. No fevers. Reports otherwise feels well.  Denies any other rash, skin changes, swelling, shortness of breath, oropharyngeal swelling. Denies recent sickness. Denies recent antibiotic use.  States that she called her primary office and was sent in triamcinolone 0.5% cream, but states the local pharmacies did not have it.  Olin Hauser, DO: PCP No LMP recorded. (Menstrual status: Irregular Periods). BTL. Denies pregnancy.    Past Medical History:  Diagnosis Date  . Abnormal Pap smear of anus    HGSIL  . Anemia   . Back pain   . Cervical dysplasia    CIN III  . Endometriosis   . Gallbladder problem   . Headache   . Heart murmur   . Hypertension   . Leg edema   . Obesity   . Obesity   . Premature delivery    x2    Patient Active Problem List   Diagnosis Date Noted  . Onychomycosis 03/11/2018  . Vitamin B12 deficiency 01/11/2018  . Pre-diabetes 01/11/2018  . Other fatigue 12/10/2017  . Shortness of breath on exertion 12/10/2017  . Edema 12/10/2017  . Vitamin D deficiency 12/10/2017  . Bilateral lower extremity edema 07/09/2017  . Dyspnea on exertion 07/09/2017  . Essential hypertension  07/09/2017  . Anemia 07/09/2017  . Chronic low back pain with left-sided sciatica 09/09/2016  . Obesity, Class III, BMI 40-49.9 (morbid obesity) ([place]) 11/23/2015    Past Surgical History:  Procedure Laterality Date  . CERVICAL BIOPSY  W/ LOOP ELECTRODE EXCISION  11/2005  . CESAREAN SECTION  2008/2015  . CESAREAN SECTION WITH BILATERAL TUBAL LIGATION  2015  . CHOLECYSTECTOMY  2003  . COLPOSCOPY  2005, 2006  . COMBINED HYSTEROSCOPY DIAGNOSTIC / D&C  2014  . CORONARY ARTERY BYPASS GRAFT       No current facility-administered medications for this encounter.   Current Outpatient Medications:  .  cyclobenzaprine (FLEXERIL) 5 MG tablet, 1 tablet every 8 hours as needed for muscle spasms, Disp: 30 tablet, Rfl: 2 .  norethindrone (AYGESTIN) 5 MG tablet, Take 1 tablet (5 mg total) by mouth daily., Disp: 30 tablet, Rfl: 11 .  terbinafine (LAMISIL) 250 MG tablet, Take 1 tablet (250 mg total) by mouth daily. For 6 weeks, then repeat course for 6 more weeks if unresolved, Disp: 45 tablet, Rfl: 1 .  triamcinolone cream (KENALOG) 0.5 %, Apply 1 application topically 2 (two) times daily. To affected areas, for up to 2 weeks., Disp: 30 g, Rfl: 0 .  vitamin B-12 (CYANOCOBALAMIN) 1000 MCG tablet, Take 2 tablets (2,000 mcg total) by mouth daily., Disp: 60 tablet, Rfl: 0 .  Vitamin D, Ergocalciferol, (DRISDOL) 50000 units CAPS capsule, Take 1 capsule (50,000  Units total) by mouth every 7 (seven) days., Disp: 4 capsule, Rfl: 1 .  hydrOXYzine (ATARAX/VISTARIL) 25 MG tablet, Take 1 tablet (25 mg total) by mouth 3 (three) times daily as needed for itching., Disp: 15 tablet, Rfl: 0 .  mupirocin ointment (BACTROBAN) 2 %, Apply two times a day for 7 days., Disp: 22 g, Rfl: 0  Allergies Latex  Family History  Adopted: Yes  Family history unknown: Yes    Social History Social History   Tobacco Use  . Smoking status: Never Smoker  . Smokeless tobacco: Never Used  Substance Use Topics  . Alcohol use: No      Alcohol/week: 0.0 standard drinks  . Drug use: No    Review of Systems Constitutional: No fever/chills Cardiovascular: Denies chest pain. Respiratory: Denies shortness of breath. Skin: as above.   ____________________________________________   PHYSICAL EXAM:  VITAL SIGNS: ED Triage Vitals  Enc Vitals Group     BP 06/18/18 1908 (!) 149/101     Pulse Rate 06/18/18 1908 91     Resp 06/18/18 1908 18     Temp 06/18/18 1908 98.2 F (36.8 C)     Temp Source 06/18/18 1908 Oral     SpO2 06/18/18 1908 100 %     Weight 06/18/18 1905 240 lb (108.9 kg)     Height 06/18/18 1905 5' (1.524 m)     Head Circumference --      Peak Flow --      Pain Score 06/18/18 1905 6     Pain Loc --      Pain Edu? --      Excl. in [place]? --     Constitutional: Alert and oriented. Well appearing and in no acute distress. ENT      Head: Normocephalic and atraumatic. Cardiovascular: Normal rate, regular rhythm. Grossly normal heart sounds.  Good peripheral circulation. Respiratory: Normal respiratory effort without tachypnea nor retractions. Breath sounds are clear and equal bilaterally. No wheezes, rales, rhonchi. Musculoskeletal: Steady gait.  Neurologic:  Normal speech and language. Speech is normal. No gait instability.  Skin:  Skin is warm, dry. Except: Right medial ankle four areas of mild erythema with papules, 2 with excoriation and slight opening, posterior papule with mild honey colored drainage, minimal local erythema, nonpurulent drainage, no further surrounding erythema, pruritic, nontender, no bony tenderness, posterior tibialis and dorsalis pedis pulses equal, no other skin changes to right lower externally noted. Psychiatric: Mood and affect are normal. Speech and behavior are normal. Patient exhibits appropriate insight and judgment   ___________________________________________   LABS (all labs ordered are listed, but only abnormal results are displayed)  Labs Reviewed - No data to  display   PROCEDURES Procedures    INITIAL IMPRESSION / ASSESSMENT AND PLAN / ED COURSE  Pertinent labs & imaging results that were available during my care of the patient were reviewed by me and considered in my medical decision making (see chart for details).  Well-appearing patient.  No acute distress.  Patient with pruritic bites to right medial ankle, patient has continued to scratch.  Discussed with patient will Rx hydroxyzine as needed for itching and will Rx topical Bactroban.  Discussed keeping clean, elevation and cool compresses to help with itching.  Discussed area appears consistent with inflammation rather than cellulitis.  Discussed strict monitoring return parameters.Discussed indication, risks and benefits of medications with patient.  Also counseled patient to continue to monitor blood pressure and follow-up with her primary care, patient states blood pressure  is elevated as she is in urgent care at this time.  Discussed follow up with Primary care physician this week as needed. Discussed follow up and return parameters including no resolution or any worsening concerns. Patient verbalized understanding and agreed to plan.   ____________________________________________   FINAL CLINICAL IMPRESSION(S) / ED DIAGNOSES  Final diagnoses:  Insect bite of right ankle, initial encounter  Impetigo     ED Discharge Orders         Ordered    mupirocin ointment (BACTROBAN) 2 %     06/18/18 1928    hydrOXYzine (ATARAX/VISTARIL) 25 MG tablet  3 times daily PRN     06/18/18 1928           Note: This dictation was prepared with Dragon dictation along with smaller phrase technology. Any transcriptional errors that result from this process are unintentional.         Marylene Land, NP 06/18/18 1941

## 2018-07-08 ENCOUNTER — Other Ambulatory Visit: Payer: 59

## 2018-07-08 DIAGNOSIS — E786 Lipoprotein deficiency: Secondary | ICD-10-CM | POA: Diagnosis not present

## 2018-07-08 DIAGNOSIS — E559 Vitamin D deficiency, unspecified: Secondary | ICD-10-CM | POA: Diagnosis not present

## 2018-07-08 DIAGNOSIS — Z0000 Encounter for general adult medical examination without abnormal findings: Secondary | ICD-10-CM

## 2018-07-08 DIAGNOSIS — E538 Deficiency of other specified B group vitamins: Secondary | ICD-10-CM

## 2018-07-08 DIAGNOSIS — I10 Essential (primary) hypertension: Secondary | ICD-10-CM

## 2018-07-08 DIAGNOSIS — R5383 Other fatigue: Secondary | ICD-10-CM

## 2018-07-08 DIAGNOSIS — E66813 Obesity, class 3: Secondary | ICD-10-CM

## 2018-07-08 DIAGNOSIS — R7303 Prediabetes: Secondary | ICD-10-CM

## 2018-07-08 DIAGNOSIS — D649 Anemia, unspecified: Secondary | ICD-10-CM | POA: Diagnosis not present

## 2018-07-09 LAB — CBC WITH DIFFERENTIAL/PLATELET
Basophils Absolute: 30 cells/uL (ref 0–200)
Basophils Relative: 0.4 %
EOS PCT: 1.1 %
Eosinophils Absolute: 81 cells/uL (ref 15–500)
HCT: 37.2 % (ref 35.0–45.0)
HEMOGLOBIN: 12.4 g/dL (ref 11.7–15.5)
LYMPHS ABS: 2146 {cells}/uL (ref 850–3900)
MCH: 28.2 pg (ref 27.0–33.0)
MCHC: 33.3 g/dL (ref 32.0–36.0)
MCV: 84.5 fL (ref 80.0–100.0)
MPV: 9.5 fL (ref 7.5–12.5)
Monocytes Relative: 5.6 %
NEUTROS ABS: 4729 {cells}/uL (ref 1500–7800)
Neutrophils Relative %: 63.9 %
Platelets: 422 10*3/uL — ABNORMAL HIGH (ref 140–400)
RBC: 4.4 10*6/uL (ref 3.80–5.10)
RDW: 12.9 % (ref 11.0–15.0)
Total Lymphocyte: 29 %
WBC: 7.4 10*3/uL (ref 3.8–10.8)
WBCMIX: 414 {cells}/uL (ref 200–950)

## 2018-07-09 LAB — COMPLETE METABOLIC PANEL WITH GFR
AG Ratio: 1.1 (calc) (ref 1.0–2.5)
ALKALINE PHOSPHATASE (APISO): 55 U/L (ref 33–115)
ALT: 17 U/L (ref 6–29)
AST: 14 U/L (ref 10–30)
Albumin: 3.8 g/dL (ref 3.6–5.1)
BILIRUBIN TOTAL: 0.4 mg/dL (ref 0.2–1.2)
BUN: 8 mg/dL (ref 7–25)
CALCIUM: 9.1 mg/dL (ref 8.6–10.2)
CHLORIDE: 104 mmol/L (ref 98–110)
CO2: 28 mmol/L (ref 20–32)
Creat: 0.67 mg/dL (ref 0.50–1.10)
GFR, Est African American: 131 mL/min/{1.73_m2} (ref >=60)
GFR, Est Non African American: 113 mL/min/{1.73_m2} (ref >=60)
GLUCOSE: 91 mg/dL (ref 65–99)
Globulin: 3.5 g/dL (calc) (ref 1.9–3.7)
POTASSIUM: 3.7 mmol/L (ref 3.5–5.3)
Sodium: 140 mmol/L (ref 135–146)
Total Protein: 7.3 g/dL (ref 6.1–8.1)

## 2018-07-09 LAB — INSULIN, RANDOM: Insulin: 25.8 u[IU]/mL — ABNORMAL HIGH (ref 2.0–19.6)

## 2018-07-09 LAB — LIPID PANEL
CHOLESTEROL: 160 mg/dL (ref <200)
HDL: 36 mg/dL — AB (ref >50)
LDL Cholesterol (Calc): 112 mg/dL (calc) — ABNORMAL HIGH
Non-HDL Cholesterol (Calc): 124 mg/dL (calc) (ref <130)
TRIGLYCERIDES: 40 mg/dL (ref <150)
Total CHOL/HDL Ratio: 4.4 (calc) (ref <5.0)

## 2018-07-09 LAB — IRON,TIBC AND FERRITIN PANEL
%SAT: 17 % (calc) (ref 16–45)
Ferritin: 29 ng/mL (ref 16–154)
Iron: 75 ug/dL (ref 40–190)
TIBC: 436 ug/dL (ref 250–450)

## 2018-07-09 LAB — HEMOGLOBIN A1C
EAG (MMOL/L): 6.3 (calc)
Hgb A1c MFr Bld: 5.6 % of total Hgb (ref <5.7)
MEAN PLASMA GLUCOSE: 114 (calc)

## 2018-07-09 LAB — VITAMIN B12: Vitamin B-12: 182 pg/mL — ABNORMAL LOW (ref 200–1100)

## 2018-07-09 LAB — VITAMIN D 25 HYDROXY (VIT D DEFICIENCY, FRACTURES): Vit D, 25-Hydroxy: 21 ng/mL — ABNORMAL LOW (ref 30–100)

## 2018-07-14 ENCOUNTER — Encounter: Payer: Self-pay | Admitting: Family Medicine

## 2018-07-15 ENCOUNTER — Other Ambulatory Visit: Payer: Self-pay | Admitting: Family Medicine

## 2018-07-15 ENCOUNTER — Ambulatory Visit (INDEPENDENT_AMBULATORY_CARE_PROVIDER_SITE_OTHER): Payer: 59 | Admitting: Family Medicine

## 2018-07-15 ENCOUNTER — Encounter: Payer: 59 | Admitting: Family Medicine

## 2018-07-15 ENCOUNTER — Encounter: Payer: Self-pay | Admitting: Family Medicine

## 2018-07-15 VITALS — BP 140/90 | HR 89 | Temp 98.4°F | Resp 16 | Ht 60.0 in | Wt 244.0 lb

## 2018-07-15 DIAGNOSIS — R7303 Prediabetes: Secondary | ICD-10-CM | POA: Diagnosis not present

## 2018-07-15 DIAGNOSIS — E538 Deficiency of other specified B group vitamins: Secondary | ICD-10-CM

## 2018-07-15 DIAGNOSIS — E559 Vitamin D deficiency, unspecified: Secondary | ICD-10-CM | POA: Diagnosis not present

## 2018-07-15 DIAGNOSIS — IMO0002 Reserved for concepts with insufficient information to code with codable children: Secondary | ICD-10-CM | POA: Insufficient documentation

## 2018-07-15 DIAGNOSIS — R5383 Other fatigue: Secondary | ICD-10-CM | POA: Diagnosis not present

## 2018-07-15 DIAGNOSIS — I10 Essential (primary) hypertension: Secondary | ICD-10-CM

## 2018-07-15 DIAGNOSIS — G43709 Chronic migraine without aura, not intractable, without status migrainosus: Secondary | ICD-10-CM

## 2018-07-15 DIAGNOSIS — E66813 Obesity, class 3: Secondary | ICD-10-CM

## 2018-07-15 DIAGNOSIS — Z0000 Encounter for general adult medical examination without abnormal findings: Secondary | ICD-10-CM | POA: Diagnosis not present

## 2018-07-15 MED ORDER — VITAMIN D (ERGOCALCIFEROL) 1.25 MG (50000 UNIT) PO CAPS: 50000.0000 [IU] | ORAL_CAPSULE | ORAL | 0 refills | Status: DC

## 2018-07-15 MED ORDER — CYANOCOBALAMIN 1000 MCG/ML IJ SOLN: INTRAMUSCULAR | 0 refills | Status: DC

## 2018-07-15 NOTE — Assessment & Plan Note (Signed)
Stable PreDM A1c 5.6 Mild elevated random insulin - discussed pathophys of insulin resistance  Plan Encourage improve lifestyle diet/exercise Remain off med Follow-up 6 months A1c

## 2018-07-15 NOTE — Assessment & Plan Note (Signed)
Low vit b12 again Off maintenance  Trial on Vitamin B12 injections 1,013mcg q 2 weeks for 1 month, then monthly for 2 months, then oral daily 1,021mcg

## 2018-07-15 NOTE — Assessment & Plan Note (Signed)
Still abnormal weight gain Remains morbid obesity BMI >47 No longer followed by [facility]  Agree with prior consideration bariatric surgery - may need referral to [facility] facility  In future consider GLP1 agent  Encouraged lifestyle interventions

## 2018-07-15 NOTE — Assessment & Plan Note (Signed)
No active migraine Chronic problem Inc freq migraines Failed Sumatriptan and other triptan unsure (suspect maxalt, as ODT), failed excedrin, rx NSAIDs  Plan Limited options - she is not interested in repeating any 1st line therapy, discussed may need to discuss further to consider prophylaxis options - Agree for 2nd opinion - referral to [facility] locally  Follow-up

## 2018-07-15 NOTE — Progress Notes (Signed)
Subjective:    Patient ID: [name], female    DOB: 10-Jan-1981, 37 y.o.   MRN: [id number]  CINDERELLA CHRISTOFFERSEN is a 37 y.o. female presenting on 07/15/2018 for Annual Exam   HPI   Here for Annual Physical and Lab Review.  FOLLOW-UP Morbid Obesity BMI >46 [facility] WeightGain / LE Edema Previously followed by [facility], and then decided continue her own lifestyle interventions after difficulty losing weight, she has since established with [employer] [facility], however she plans to defer surgery for now, and was told can only proceed at [facility] facility. - Weight gain since last visit - Does admit to limited exercisestill, but often stays active with walking on job.  Migraines, chronic Reports chronic problem with migraine headaches. Not focus of visit today, but she is interested in new treatment options. She would like 2nd opinion from Neurology. She has failed Imitrex, and other triptan, Excedrin Migraine, NSAIDs among other therapy. Has migraines 2-3x a month. Takes Flexeril PRN. Triggers with season change and stress as well.  Vitamin D Deficiency / Vitamin B12 Deficiency Recent labs show both Vit D and Vit B12 are low. She finished prior treatment VitD3 50k weekly x 3 months and did not start replacement dose. Also she was taking Vitamin B12 2,000 daily but now is off this as well. Admits some reduced energy  Previously on B12 injections, would like to restart  Pre-Diabetes: Improved A1c stable at 5.6 CBGs:not checking Meds: OFF Metformin  CHRONIC HTN: Elevated BP today. She remains off medicine. No home BP readings available.  Possible Onychomycosis - failed terbinafine treatment after 3 months. Did not seem to resolve her problem.  History of Anemia Last labs 07/2018, showed normalized Hgb and anemia panel that is also normal.   Health Maintenance:  Due for Flu vaccine, she will get through employer.  Due for pap smear, she is followed by [facility], has seen them earlier this year 12/2017 and 10/2017 for yearly. Do not see pap smear results in system. She believes had it updated or will need it updated soon, she will contact them.  Depression screen [facility] 2/9 07/15/2018 03/11/2018 12/10/2017  Decreased Interest 0 0 1  Down, Depressed, Hopeless 0 0 1  PHQ - 2 Score 0 0 2  Altered sleeping - 0 1  Tired, decreased energy - 0 3  Change in appetite - 0 3  Feeling bad or failure about yourself  - 0 0  Trouble concentrating - 0 0  Moving slowly or fidgety/restless - 0 0  Suicidal thoughts - 0 0  PHQ-9 Score - 0 9  Difficult doing work/chores - Not difficult at all Not difficult at all    Past Medical History:  Diagnosis Date  . Abnormal Pap smear of anus    HGSIL  . Anemia   . Back pain   . Cervical dysplasia    CIN III  . Endometriosis   . Gallbladder problem   . Headache   . Heart murmur   . Leg edema   . Premature delivery    x2   Past Surgical History:  Procedure Laterality Date  . CERVICAL BIOPSY  W/ LOOP ELECTRODE EXCISION  11/2005  . CESAREAN SECTION  2008/2015  . CESAREAN SECTION WITH BILATERAL TUBAL LIGATION  2015  . CHOLECYSTECTOMY  2003  . COLPOSCOPY  2005, 2006  . COMBINED HYSTEROSCOPY DIAGNOSTIC / D&C  2014  . CORONARY ARTERY BYPASS GRAFT     Social  History   Socioeconomic History  . Marital status: Married    Spouse name: Christia Reading  . Number of children: 5  . Years of education: Not on file  . Highest education level: Not on file  Occupational History  . Occupation: CNA  Social Needs  . Financial resource strain: Not on file  . Food insecurity:    Worry: Not on file    Inability: Not on file  . Transportation needs:    Medical: Not on file    Non-medical: Not on file  Tobacco Use  . Smoking status: Never Smoker  . Smokeless tobacco: Never Used  Substance and Sexual Activity  . Alcohol use: No    Alcohol/week: 0.0 standard drinks  . Drug use: No  . Sexual activity: Yes    Birth  control/protection: Pill  Lifestyle  . Physical activity:    Days per week: 0 days    Minutes per session: 0 min  . Stress: Very much  Relationships  . Social connections:    Talks on phone: More than three times a week    Gets together: More than three times a week    Attends religious service: More than 4 times per year    Active member of club or organization: No    Attends meetings of clubs or organizations: Never    Relationship status: Married  . Intimate partner violence:    Fear of current or ex partner: No    Emotionally abused: No    Physically abused: No    Forced sexual activity: No  Other Topics Concern  . Not on file  Social History Narrative  . Not on file   Family History  Adopted: Yes  Family history unknown: Yes   Current Outpatient Medications on File Prior to Visit  Medication Sig  . cyclobenzaprine (FLEXERIL) 5 MG tablet 1 tablet every 8 hours as needed for muscle spasms  . hydrOXYzine (ATARAX/VISTARIL) 25 MG tablet Take 1 tablet (25 mg total) by mouth 3 (three) times daily as needed for itching.  . norethindrone (AYGESTIN) 5 MG tablet Take 1 tablet (5 mg total) by mouth daily.  Marland Kitchen triamcinolone cream (KENALOG) 0.5 % Apply 1 application topically 2 (two) times daily. To affected areas, for up to 2 weeks.  . vitamin B-12 (CYANOCOBALAMIN) 1000 MCG tablet Take 2 tablets (2,000 mcg total) by mouth daily.   No current facility-administered medications on file prior to visit.     Review of Systems  Constitutional: Negative for activity change, appetite change, chills, diaphoresis, fatigue and fever.  HENT: Negative for congestion and hearing loss.   Eyes: Negative for visual disturbance.  Respiratory: Negative for apnea, cough, choking, chest tightness, shortness of breath and wheezing.   Cardiovascular: Positive for leg swelling. Negative for chest pain and palpitations.  Gastrointestinal: Negative for abdominal pain, anal bleeding, blood in stool,  constipation, diarrhea, nausea and vomiting.  Endocrine: Negative for cold intolerance.  Genitourinary: Negative for difficulty urinating, dysuria, frequency and hematuria.  Musculoskeletal: Negative for arthralgias, back pain and neck pain.  Skin: Negative for rash.  Allergic/Immunologic: Negative for environmental allergies.  Neurological: Positive for headaches. Negative for dizziness, weakness, light-headedness and numbness.  Hematological: Negative for adenopathy.  Psychiatric/Behavioral: Negative for behavioral problems, dysphoric mood and sleep disturbance. The patient is not nervous/anxious.    Per HPI unless specifically indicated above      Objective:    BP 140/90   Pulse 89   Temp 98.4 F (36.9 C) (Oral)  Resp 16   Ht 5' (1.524 m)   Wt 244 lb (110.7 kg)   BMI 47.65 kg/m   Wt Readings from Last 3 Encounters:  07/15/18 244 lb (110.7 kg)  06/18/18 240 lb (108.9 kg)  05/04/18 240 lb 12.8 oz (109.2 kg)    Physical Exam  Constitutional: She is oriented to person, place, and time. She appears well-developed and well-nourished. No distress.  Well-appearing, comfortable, cooperative, morbidly obese  HENT:  Head: Normocephalic and atraumatic.  Mouth/Throat: Oropharynx is clear and moist.  Eyes: Pupils are equal, round, and reactive to light. Conjunctivae and EOM are normal. Right eye exhibits no discharge. Left eye exhibits no discharge.  Neck: Normal range of motion. Neck supple. No thyromegaly present.  Cardiovascular: Normal rate, regular rhythm, normal heart sounds and intact distal pulses.  No murmur heard. Pulmonary/Chest: Effort normal and breath sounds normal. No respiratory distress. She has no wheezes. She has no rales.  Abdominal: Soft. Bowel sounds are normal. She exhibits no distension and no mass. There is no tenderness.  Musculoskeletal: Normal range of motion. She exhibits edema (non pitting bilateral lower extremity and pedal edema seems near baseline  today, non tender, no erythema). She exhibits no tenderness.  Upper / Lower Extremities: - Normal muscle tone, strength bilateral upper extremities 5/5, lower extremities 5/5  Lymphadenopathy:    She has no cervical adenopathy.  Neurological: She is alert and oriented to person, place, and time.  Distal sensation intact to light touch all extremities  Skin: Skin is warm and dry. No rash noted. She is not diaphoretic. No erythema.  Psychiatric: She has a normal mood and affect. Her behavior is normal.  Well groomed, good eye contact, normal speech and thoughts  Nursing note and vitals reviewed.  Results for orders placed or performed in visit on 07/08/18  Iron, TIBC and Ferritin Panel  Result Value Ref Range   Iron 75 40 - 190 mcg/dL   TIBC 436 250 - 450 mcg/dL (calc)   %SAT 17 16 - 45 % (calc)   Ferritin 29 16 - 154 ng/mL  Hemoglobin A1c  Result Value Ref Range   Hgb A1c MFr Bld 5.6 <5.7 % of total Hgb   Mean Plasma Glucose 114 (calc)   eAG (mmol/L) 6.3 (calc)  CBC with Differential/Platelet  Result Value Ref Range   WBC 7.4 3.8 - 10.8 Thousand/uL   RBC 4.40 3.80 - 5.10 Million/uL   Hemoglobin 12.4 11.7 - 15.5 g/dL   HCT 37.2 35.0 - 45.0 %   MCV 84.5 80.0 - 100.0 fL   MCH 28.2 27.0 - 33.0 pg   MCHC 33.3 32.0 - 36.0 g/dL   RDW 12.9 11.0 - 15.0 %   Platelets 422 (H) 140 - 400 Thousand/uL   MPV 9.5 7.5 - 12.5 fL   Neutro Abs 4,729 1,500 - 7,800 cells/uL   Lymphs Abs 2,146 850 - 3,900 cells/uL   WBC mixed population 414 200 - 950 cells/uL   Eosinophils Absolute 81 15 - 500 cells/uL   Basophils Absolute 30 0 - 200 cells/uL   Neutrophils Relative % 63.9 %   Total Lymphocyte 29.0 %   Monocytes Relative 5.6 %   Eosinophils Relative 1.1 %   Basophils Relative 0.4 %  COMPLETE METABOLIC PANEL WITH GFR  Result Value Ref Range   Glucose, Bld 91 65 - 99 mg/dL   BUN 8 7 - 25 mg/dL   Creat 0.67 0.50 - 1.10 mg/dL   GFR, Est Non African  American 113 > OR = 60 mL/min/1.12m2   GFR,  Est African American 131 > OR = 60 mL/min/1.52m2   BUN/Creatinine Ratio NOT APPLICABLE 6 - 22 (calc)   Sodium 140 135 - 146 mmol/L   Potassium 3.7 3.5 - 5.3 mmol/L   Chloride 104 98 - 110 mmol/L   CO2 28 20 - 32 mmol/L   Calcium 9.1 8.6 - 10.2 mg/dL   Total Protein 7.3 6.1 - 8.1 g/dL   Albumin 3.8 3.6 - 5.1 g/dL   Globulin 3.5 1.9 - 3.7 g/dL (calc)   AG Ratio 1.1 1.0 - 2.5 (calc)   Total Bilirubin 0.4 0.2 - 1.2 mg/dL   Alkaline phosphatase (APISO) 55 33 - 115 U/L   AST 14 10 - 30 U/L   ALT 17 6 - 29 U/L  Lipid panel  Result Value Ref Range   Cholesterol 160 <200 mg/dL   HDL 36 (L) >50 mg/dL   Triglycerides 40 <150 mg/dL   LDL Cholesterol (Calc) 112 (H) mg/dL (calc)   Total CHOL/HDL Ratio 4.4 <5.0 (calc)   Non-HDL Cholesterol (Calc) 124 <130 mg/dL (calc)  VITAMIN D 25 Hydroxy (Vit-D Deficiency, Fractures)  Result Value Ref Range   Vit D, 25-Hydroxy 21 (L) 30 - 100 ng/mL  Vitamin B12  Result Value Ref Range   Vitamin B-12 182 (L) 200 - 1,100 pg/mL  Insulin, random  Result Value Ref Range   Insulin 25.8 (H) 2.0 - 19.6 uIU/mL      Assessment & Plan:   Problem List Items Addressed This Visit    Chronic migraine    No active migraine Chronic problem Inc freq migraines Failed Sumatriptan and other triptan unsure (suspect maxalt, as ODT), failed excedrin, rx NSAIDs  Plan Limited options - she is not interested in repeating any 1st line therapy, discussed may need to discuss further to consider prophylaxis options - Agree for 2nd opinion - referral to [facility] locally  Follow-up      Relevant Orders   Ambulatory referral to Neurology   Essential hypertension    Still elevated BP No outside BP readings from home/work - advised to check again No known complications  Plan:  1. Considered therapy options but not focus today - agreed to hold off on change at this time 2. Encourage improved lifestyle - low sodium diet, regular exercise, wt loss 3. Start monitor BP outside  office, bring readings to next visit, if persistently >140/90 or new symptoms notify office sooner      Obesity, Class III, BMI 40-49.9 (morbid obesity) ([place])    Still abnormal weight gain Remains morbid obesity BMI >47 No longer followed by [facility]  Agree with prior consideration bariatric surgery - may need referral to [facility] facility  In future consider GLP1 agent  Encouraged lifestyle interventions      Other fatigue    Likely from Vitamin B12 / D deficiency Will treat see A&P      Pre-diabetes    Stable PreDM A1c 5.6 Mild elevated random insulin - discussed pathophys of insulin resistance  Plan Encourage improve lifestyle diet/exercise Remain off med Follow-up 6 months A1c      Vitamin B12 deficiency    Low vit b12 again Off maintenance  Trial on Vitamin B12 injections 1,060mcg q 2 weeks for 1 month, then monthly for 2 months, then oral daily 1,052mcg      Relevant Medications   cyanocobalamin (,VITAMIN B-12,) 1000 MCG/ML injection   Vitamin D deficiency  Again low Vit D Off maintenance  Restart Vit D3 50k weekly x 3 months Then start maintenance dosing 2k daily      Relevant Medications   Vitamin D, Ergocalciferol, (DRISDOL) 50000 units CAPS capsule    Other Visit Diagnoses    Annual physical exam    -  Primary      Updated Health Maintenance information Reviewed recent lab results with patient Encouraged improvement to lifestyle with diet and exercise - Goal of weight loss   Meds ordered this encounter  Medications  . Vitamin D, Ergocalciferol, (DRISDOL) 50000 units CAPS capsule    Sig: Take 1 capsule (50,000 Units total) by mouth every 7 (seven) days. For 3 months    Dispense:  12 capsule    Refill:  0  . cyanocobalamin (,VITAMIN B-12,) 1000 MCG/ML injection    Sig: Inject 1 mL (1033mcg) IM every 2 weeks for 2 doses, then monthly for 2 doses    Dispense:  4 mL    Refill:  0    Follow up plan: Return in about 6 months (around  01/13/2019) for 6 month PreDM, Weight, Lab.  Future labs ordered for 01/06/19  Nobie Putnam, [facility] 07/15/2018, 6:39 PM

## 2018-07-15 NOTE — Assessment & Plan Note (Signed)
Still elevated BP No outside BP readings from home/work - advised to check again No known complications  Plan:  1. Considered therapy options but not focus today - agreed to hold off on change at this time 2. Encourage improved lifestyle - low sodium diet, regular exercise, wt loss 3. Start monitor BP outside office, bring readings to next visit, if persistently >140/90 or new symptoms notify office sooner

## 2018-07-15 NOTE — Patient Instructions (Addendum)
Thank you for coming to the office today.  Please schedule and return for a NURSE ONLY VISIT for VACCINE - Approximately around end of September / October 2019 - Need Flu Vaccine  Vitamin B12 injection 1081mcg every 2 weeks for 2 doses, then monthly for 2 doses - schedule nurse visit injections - Pick up med at pharmacy  Vitamin D3 50,000 weekly for 3 months - then reduce to Vitamin D3 2,000 iu daily maintenance  Referral for Migraines.  [facility] - Neurology Dept [place],  [id number] Phone: [phone]   DUE for FASTING BLOOD WORK (no food or drink after midnight before the lab appointment, only water or coffee without cream/sugar on the morning of)  SCHEDULE "Lab Only" visit in the morning at the clinic for lab draw in 6 MONTHS   - Make sure Lab Only appointment is at about 1 week before your next appointment, so that results will be available  For Lab Results, once available within 2-3 days of blood draw, you can can log in to MyChart online to view your results and a brief explanation. Also, we can discuss results at next follow-up visit.   Please schedule a Follow-up Appointment to: Return in about 6 months (around 01/13/2019) for 6 month PreDM, Weight, Lab.  If you have any other questions or concerns, please feel free to call the office or send a message through [place]. You may also schedule an earlier appointment if necessary.  Additionally, you may be receiving a survey about your experience at our office within a few days to 1 week by e-mail or mail. We value your feedback.  Nobie Putnam, DO [place]

## 2018-07-15 NOTE — Assessment & Plan Note (Signed)
Again low Vit D Off maintenance  Restart Vit D3 50k weekly x 3 months Then start maintenance dosing 2k daily

## 2018-07-15 NOTE — Assessment & Plan Note (Signed)
Likely from Vitamin B12 / D deficiency Will treat see A&P

## 2018-07-22 ENCOUNTER — Ambulatory Visit (INDEPENDENT_AMBULATORY_CARE_PROVIDER_SITE_OTHER): Payer: 59

## 2018-07-22 VITALS — Temp 98.7°F

## 2018-07-22 DIAGNOSIS — E559 Vitamin D deficiency, unspecified: Secondary | ICD-10-CM

## 2018-07-22 DIAGNOSIS — R7303 Prediabetes: Secondary | ICD-10-CM

## 2018-07-22 DIAGNOSIS — E538 Deficiency of other specified B group vitamins: Secondary | ICD-10-CM

## 2018-07-22 DIAGNOSIS — E66813 Obesity, class 3: Secondary | ICD-10-CM

## 2018-07-22 DIAGNOSIS — Z23 Encounter for immunization: Secondary | ICD-10-CM | POA: Diagnosis not present

## 2018-07-22 MED ORDER — CYANOCOBALAMIN 1000 MCG/ML IJ SOLN
1000.0000 ug | Freq: Once | INTRAMUSCULAR | Status: AC
  Administered 2018-07-22: 1000 ug via INTRAMUSCULAR

## 2018-07-22 NOTE — Progress Notes (Signed)
1 ML B12 1000 mcg injection given in the Right deltoid. The pt tolerated the injection without any difficulties. Her next scheduled B12 is in two weeks.

## 2018-07-22 NOTE — Progress Notes (Signed)
Pt report allergy to latex, but had the quad influenza immunization for the last 5 years without any complications. I spoke w/ Ander Purpura and she gave me a verbal approval to administer the quad influenza.

## 2018-08-05 ENCOUNTER — Ambulatory Visit (INDEPENDENT_AMBULATORY_CARE_PROVIDER_SITE_OTHER): Payer: 59

## 2018-08-05 DIAGNOSIS — E538 Deficiency of other specified B group vitamins: Secondary | ICD-10-CM

## 2018-08-05 MED ORDER — CYANOCOBALAMIN 1000 MCG/ML IJ SOLN
1000.0000 ug | Freq: Once | INTRAMUSCULAR | Status: AC
  Administered 2018-08-05: 1000 ug via INTRAMUSCULAR

## 2018-08-05 NOTE — Progress Notes (Signed)
1 ML B12 1000 mcg injection given in the Right deltoid. The pt tolerated the injection without any difficulties. Her next scheduled B12 is in two weeks.

## 2018-08-23 ENCOUNTER — Encounter: Payer: Self-pay | Admitting: Family Medicine

## 2018-08-26 ENCOUNTER — Ambulatory Visit (INDEPENDENT_AMBULATORY_CARE_PROVIDER_SITE_OTHER): Payer: 59

## 2018-08-26 DIAGNOSIS — E538 Deficiency of other specified B group vitamins: Secondary | ICD-10-CM | POA: Diagnosis not present

## 2018-08-26 MED ORDER — CYANOCOBALAMIN 1000 MCG/ML IJ SOLN
1000.0000 ug | Freq: Once | INTRAMUSCULAR | Status: AC
  Administered 2018-08-26: 1000 ug via INTRAMUSCULAR

## 2018-08-26 MED ORDER — CYANOCOBALAMIN 1000 MCG/ML IJ SOLN: 1000.0000 ug | Freq: Once | INTRAMUSCULAR | 0 refills | Status: DC

## 2018-09-02 DIAGNOSIS — M222X1 Patellofemoral disorders, right knee: Secondary | ICD-10-CM | POA: Diagnosis not present

## 2018-09-02 DIAGNOSIS — M79621 Pain in right upper arm: Secondary | ICD-10-CM | POA: Diagnosis not present

## 2018-09-05 ENCOUNTER — Other Ambulatory Visit: Payer: Self-pay

## 2018-09-05 ENCOUNTER — Encounter: Payer: Self-pay | Admitting: Gynecology

## 2018-09-05 ENCOUNTER — Ambulatory Visit
Admission: EM | Admit: 2018-09-05 | Discharge: 2018-09-05 | Disposition: A | Discharge status: Home / Self Care | Payer: 59 | Attending: Emergency Medicine | Admitting: Emergency Medicine

## 2018-09-05 DIAGNOSIS — R69 Illness, unspecified: Principal | ICD-10-CM

## 2018-09-05 DIAGNOSIS — R509 Fever, unspecified: Secondary | ICD-10-CM

## 2018-09-05 DIAGNOSIS — J111 Influenza due to unidentified influenza virus with other respiratory manifestations: Secondary | ICD-10-CM

## 2018-09-05 DIAGNOSIS — R0981 Nasal congestion: Secondary | ICD-10-CM | POA: Diagnosis not present

## 2018-09-05 DIAGNOSIS — J029 Acute pharyngitis, unspecified: Secondary | ICD-10-CM

## 2018-09-05 LAB — RAPID STREP SCREEN (MED CTR MEBANE ONLY): Streptococcus, Group A Screen (Direct): NEGATIVE

## 2018-09-05 MED ORDER — OSELTAMIVIR PHOSPHATE 75 MG PO CAPS: 75.0000 mg | ORAL_CAPSULE | Freq: Two times a day (BID) | ORAL | 0 refills | Status: DC

## 2018-09-05 NOTE — ED Provider Notes (Signed)
[facility] ____________________________________________  Time seen: Approximately 11:38 AM  I have reviewed the triage vital signs and the nursing notes.   HISTORY  Chief Complaint Sinusitis and Sore Throat  HPI Stacey Taylor is a 37 y.o. female presenting for evaluation of sore throat, fever, sinus congestion and sinus pain since Friday.  Reports Friday she started with all symptoms with accompanying fever but improved somewhat Friday night but temperature returned Saturday.  States T-max 102.5 yesterday.  Has taken some intermittent over-the-counter medications without much change.  States sore throat currently is mild.  States yesterday she was having a lot of sinus pain and sinus pressure with drainage consistent with previous sinus infections.  States prior to the last few days she did not have any runny nose, nasal congestion or sickness complaints.  Denies known recurrent seasonal allergies.  Slight decrease in appetite, but overall continues to eat and drink well.  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  States sinus congestion is better today than yesterday.  Denies other aggravating alleviating factors.  Denies recent sickness.  Reports otherwise doing well.  Denies accompanying chest pain, shortness of breath, abdominal pain or rash.  Olin Hauser, DO: PCP No LMP recorded. (Menstrual status: Oral contraceptives).denies pregnancy.     Past Medical History:  Diagnosis Date  . Abnormal Pap smear of anus    HGSIL  . Anemia   . Back pain   . Cervical dysplasia    CIN III  . Endometriosis   . Gallbladder problem   . Headache   . Heart murmur   . Leg edema   . Premature delivery    x2    Patient Active Problem List   Diagnosis Date Noted  . Chronic migraine 07/15/2018  . Onychomycosis 03/11/2018  . Vitamin B12 deficiency 01/11/2018  . Pre-diabetes 01/11/2018  . Other fatigue 12/10/2017  . Shortness of breath on  exertion 12/10/2017  . Edema 12/10/2017  . Vitamin D deficiency 12/10/2017  . Bilateral lower extremity edema 07/09/2017  . Dyspnea on exertion 07/09/2017  . Essential hypertension 07/09/2017  . Anemia 07/09/2017  . Chronic low back pain with left-sided sciatica 09/09/2016  . Obesity, Class III, BMI 40-49.9 (morbid obesity) ([place]) 11/23/2015    Past Surgical History:  Procedure Laterality Date  . CERVICAL BIOPSY  W/ LOOP ELECTRODE EXCISION  11/2005  . CESAREAN SECTION  2008/2015  . CESAREAN SECTION WITH BILATERAL TUBAL LIGATION  2015  . CHOLECYSTECTOMY  2003  . COLPOSCOPY  2005, 2006  . COMBINED HYSTEROSCOPY DIAGNOSTIC / D&C  2014  . CORONARY ARTERY BYPASS GRAFT       No current facility-administered medications for this encounter.   Current Outpatient Medications:  .  cyanocobalamin (,VITAMIN B-12,) 1000 MCG/ML injection, Inject 1 mL (1096mcg) IM every 2 weeks for 2 doses, then monthly for 2 doses, Disp: 4 mL, Rfl: 0 .  cyclobenzaprine (FLEXERIL) 5 MG tablet, 1 tablet every 8 hours as needed for muscle spasms, Disp: 30 tablet, Rfl: 2 .  norethindrone (AYGESTIN) 5 MG tablet, Take 1 tablet (5 mg total) by mouth daily., Disp: 30 tablet, Rfl: 11 .  Vitamin D, Ergocalciferol, (DRISDOL) 50000 units CAPS capsule, Take 1 capsule (50,000 Units total) by mouth every 7 (seven) days. For 3 months, Disp: 12 capsule, Rfl: 0 .  oseltamivir (TAMIFLU) 75 MG capsule, Take 1 capsule (75 mg total) by mouth every 12 (twelve) hours., Disp: 10 capsule, Rfl: 0  Allergies Latex  Family  History  Adopted: Yes  Family history unknown: Yes    Social History Social History   Tobacco Use  . Smoking status: Never Smoker  . Smokeless tobacco: Never Used  Substance Use Topics  . Alcohol use: No    Alcohol/week: 0.0 standard drinks  . Drug use: No    Review of Systems Constitutional: Positive fever ENT: as above.  Cardiovascular: Denies chest pain. Respiratory: Denies shortness of  breath. Gastrointestinal: No abdominal pain.  No nausea, no vomiting.  No diarrhea.   Genitourinary: Negative for dysuria. Musculoskeletal: Reports low aching dull back pain not changed with movement and not tender to palpation. Skin: Negative for rash. Neurological: Negative for focal weakness or numbness.   ____________________________________________   PHYSICAL EXAM:  VITAL SIGNS: ED Triage Vitals  Enc Vitals Group     BP 09/05/18 1031 (!) 144/106     Pulse Rate 09/05/18 1031 (!) 101     Resp 09/05/18 1031 16     Temp 09/05/18 1031 98.1 F (36.7 C)     Temp Source 09/05/18 1031 Oral     SpO2 09/05/18 1031 98 %     Weight 09/05/18 1033 244 lb (110.7 kg)     Height 09/05/18 1033 5' (1.524 m)     Head Circumference --      Peak Flow --      Pain Score 09/05/18 1033 6     Pain Loc --      Pain Edu? --      Excl. in [place]? --     Constitutional: Alert and oriented. Well appearing and in no acute distress. Eyes: Conjunctivae are normal.  Head: Atraumatic.Mild to moderate tenderness to palpation bilateral frontal and maxillary sinuses. No swelling. No erythema.   Ears: no erythema, normal TMs bilaterally.   Nose: nasal congestion with bilateral nasal turbinate erythema and edema.   Mouth/Throat: Mucous membranes are moist.  Oropharynx non-erythematous.No tonsillar swelling or exudate.  Neck: No stridor.  No cervical spine tenderness to palpation. Hematological/Lymphatic/Immunilogical: No cervical lymphadenopathy. Cardiovascular: Normal rate, regular rhythm. Grossly normal heart sounds.  Good peripheral circulation. Respiratory: Normal respiratory effort.  No retractions. No wheezes, rales or rhonchi. Good air movement.  Gastrointestinal: Soft and nontender.  Musculoskeletal:No cervical, thoracic or lumbar tenderness to palpation.  Neurologic:  Normal speech and language. No gait instability. Skin:  Skin is warm, dry and intact. No rash noted. Psychiatric: Mood and affect are  normal. Speech and behavior are normal.  ___________________________________________   LABS (all labs ordered are listed, but only abnormal results are displayed)  Labs Reviewed  RAPID STREP SCREEN ([facility] ONLY)  CULTURE, GROUP A STREP [facility])     PROCEDURES Procedures     INITIAL IMPRESSION / ASSESSMENT AND PLAN / ED COURSE  Pertinent labs & imaging results that were available during my care of the patient were reviewed by me and considered in my medical decision making (see chart for details).  Very well-appearing patient.  No acute distress.  Reports quick onset of symptoms with accompanying +102.5 T-max fever yesterday.  Quick strep negative, will culture.  Patient expressed concern of sinus infection, however reports symptoms have not been going longer than 3 days.  Suspect viral illness such as influenza-like illness.  Discussed evaluation of influenza test, patient declined at this time.  Denies renal insufficiency.  Will treat with Tamiflu.  Encouraged over-the-counter Tylenol, ibuprofen, cough and congestion medications as needed.  Encourage rest, fluids, supportive care.  Work note given.Discussed indication,  risks and benefits of medications with patient.   Discussed follow up with Primary care physician this week. Discussed follow up and return parameters including no resolution or any worsening concerns. Patient verbalized understanding and agreed to plan.   ____________________________________________   FINAL CLINICAL IMPRESSION(S) / ED DIAGNOSES  Final diagnoses:  Influenza-like illness     ED Discharge Orders         Ordered    oseltamivir (TAMIFLU) 75 MG capsule  Every 12 hours     09/05/18 1137           Note: This dictation was prepared with Dragon dictation along with smaller phrase technology. Any transcriptional errors that result from this process are unintentional.         Marylene Land, NP 09/05/18 1516

## 2018-09-05 NOTE — Discharge Instructions (Signed)
Take medication as prescribed. Rest. Drink plenty of fluids.  ° °Follow up with your primary care physician this week as needed. Return to Urgent care for new or worsening concerns.  ° °

## 2018-09-05 NOTE — ED Triage Notes (Signed)
Per patient c/o fever / sore throat and sinus infection.

## 2018-09-08 LAB — CULTURE, GROUP A STREP (THRC)

## 2018-09-09 DIAGNOSIS — R42 Dizziness and giddiness: Secondary | ICD-10-CM | POA: Diagnosis not present

## 2018-09-09 DIAGNOSIS — R51 Headache: Secondary | ICD-10-CM | POA: Diagnosis not present

## 2018-09-16 ENCOUNTER — Ambulatory Visit (INDEPENDENT_AMBULATORY_CARE_PROVIDER_SITE_OTHER): Payer: 59

## 2018-09-16 DIAGNOSIS — E538 Deficiency of other specified B group vitamins: Secondary | ICD-10-CM | POA: Diagnosis not present

## 2018-09-16 DIAGNOSIS — R519 Headache, unspecified: Secondary | ICD-10-CM | POA: Insufficient documentation

## 2018-09-16 DIAGNOSIS — R42 Dizziness and giddiness: Secondary | ICD-10-CM | POA: Insufficient documentation

## 2018-09-16 DIAGNOSIS — M222X1 Patellofemoral disorders, right knee: Secondary | ICD-10-CM | POA: Diagnosis not present

## 2018-09-16 MED ORDER — CYANOCOBALAMIN 1000 MCG/ML IJ SOLN
1000.0000 ug | Freq: Once | INTRAMUSCULAR | Status: AC
  Administered 2018-09-16: 1000 ug via INTRAMUSCULAR

## 2018-09-16 NOTE — Progress Notes (Signed)
1 ML B12 1000 mcg injection given in the Right deltoid. The pt tolerated the injection without any difficulties. Pt instructed that this is her last injection for this series. We will repeat labs and decided if she needs to continue with B-12 injections.

## 2018-10-30 DIAGNOSIS — M25561 Pain in right knee: Secondary | ICD-10-CM | POA: Diagnosis not present

## 2018-10-30 DIAGNOSIS — M25461 Effusion, right knee: Secondary | ICD-10-CM | POA: Diagnosis not present

## 2018-10-30 DIAGNOSIS — Z87891 Personal history of nicotine dependence: Secondary | ICD-10-CM | POA: Diagnosis not present

## 2018-10-30 DIAGNOSIS — Z9049 Acquired absence of other specified parts of digestive tract: Secondary | ICD-10-CM | POA: Diagnosis not present

## 2018-10-30 DIAGNOSIS — Z91040 Latex allergy status: Secondary | ICD-10-CM | POA: Diagnosis not present

## 2018-11-18 ENCOUNTER — Other Ambulatory Visit (HOSPITAL_COMMUNITY)
Admission: RE | Admit: 2018-11-18 | Discharge: 2018-11-18 | Disposition: A | Discharge status: Home / Self Care | Payer: 59 | Source: Ambulatory Visit | Attending: Obstetrics and Gynecology | Admitting: Obstetrics and Gynecology

## 2018-11-18 ENCOUNTER — Ambulatory Visit (INDEPENDENT_AMBULATORY_CARE_PROVIDER_SITE_OTHER): Payer: 59 | Admitting: Obstetrics and Gynecology

## 2018-11-18 ENCOUNTER — Encounter: Payer: Self-pay | Admitting: Obstetrics and Gynecology

## 2018-11-18 VITALS — BP 146/90 | HR 105 | Ht 60.0 in | Wt 250.0 lb

## 2018-11-18 DIAGNOSIS — Z113 Encounter for screening for infections with a predominantly sexual mode of transmission: Secondary | ICD-10-CM | POA: Insufficient documentation

## 2018-11-18 DIAGNOSIS — Z124 Encounter for screening for malignant neoplasm of cervix: Secondary | ICD-10-CM

## 2018-11-18 DIAGNOSIS — Z1239 Encounter for other screening for malignant neoplasm of breast: Secondary | ICD-10-CM

## 2018-11-18 DIAGNOSIS — Z01419 Encounter for gynecological examination (general) (routine) without abnormal findings: Secondary | ICD-10-CM | POA: Diagnosis not present

## 2018-11-18 MED ORDER — NORETHINDRONE ACETATE 5 MG PO TABS: 5.0000 mg | ORAL_TABLET | Freq: Every day | ORAL | 3 refills | Status: DC

## 2018-11-18 NOTE — Progress Notes (Signed)
Gynecology Annual Exam   PCP: Olin Hauser, DO  Chief Complaint:  Chief Complaint  Patient presents with  . Gynecologic Exam    refill on OCP/requesting STD check    History of Present Illness: Patient is a 38 y.o. [id number] presents for annual exam. The patient has no complaints today.   LMP: No LMP recorded. (Menstrual status: Oral contraceptives). Absent on norethindrone  The patient is sexually active. She currently uses tubal ligation for contraception. She denies dyspareunia.   There is no notable family history of breast or ovarian cancer in her family.  The patient wears seatbelts: yes.   The patient has regular exercise: not asked.    The patient denies current symptoms of depression.    Review of Systems: Review of Systems  Constitutional: Negative for chills and fever.  HENT: Negative for congestion.   Respiratory: Negative for cough and shortness of breath.   Cardiovascular: Negative for chest pain and palpitations.  Gastrointestinal: Negative for abdominal pain, constipation, diarrhea, heartburn, nausea and vomiting.  Genitourinary: Negative for dysuria, frequency and urgency.  Skin: Negative for itching and rash.  Neurological: Negative for dizziness and headaches.  Endo/Heme/Allergies: Negative for polydipsia.  Psychiatric/Behavioral: Negative for depression.    Past Medical History:  Past Medical History:  Diagnosis Date  . Abnormal Pap smear of anus    HGSIL  . Anemia   . Back pain   . Cervical dysplasia    CIN III  . Endometriosis   . Gallbladder problem   . Headache   . Heart murmur   . Leg edema   . Premature delivery    x2    Past Surgical History:  Past Surgical History:  Procedure Laterality Date  . CERVICAL BIOPSY  W/ LOOP ELECTRODE EXCISION  11/2005  . CESAREAN SECTION  2008/2015  . CESAREAN SECTION WITH BILATERAL TUBAL LIGATION  2015  . CHOLECYSTECTOMY  2003  . COLPOSCOPY  2005, 2006  . COMBINED HYSTEROSCOPY  DIAGNOSTIC / D&C  2014  . CORONARY ARTERY BYPASS GRAFT      Gynecologic History:  No LMP recorded. (Menstrual status: Oral contraceptives). Contraception: tubal ligation Last Pap: Results were: NIL and HR HPV negative   Obstetric History: [id number]  Family History:  Family History  Adopted: Yes  Family history unknown: Yes    Social History:  Social History   Socioeconomic History  . Marital status: Married    Spouse name: Christia Reading  . Number of children: 5  . Years of education: Not on file  . Highest education level: Not on file  Occupational History  . Occupation: CNA  Social Needs  . Financial resource strain: Not on file  . Food insecurity:    Worry: Not on file    Inability: Not on file  . Transportation needs:    Medical: Not on file    Non-medical: Not on file  Tobacco Use  . Smoking status: Never Smoker  . Smokeless tobacco: Never Used  Substance and Sexual Activity  . Alcohol use: No    Alcohol/week: 0.0 standard drinks  . Drug use: No  . Sexual activity: Yes    Birth control/protection: Pill  Lifestyle  . Physical activity:    Days per week: 0 days    Minutes per session: 0 min  . Stress: Very much  Relationships  . Social connections:    Talks on phone: More than three times a week    Gets together: More than three times  a week    Attends religious service: More than 4 times per year    Active member of club or organization: No    Attends meetings of clubs or organizations: Never    Relationship status: Married  . Intimate partner violence:    Fear of current or ex partner: No    Emotionally abused: No    Physically abused: No    Forced sexual activity: No  Other Topics Concern  . Not on file  Social History Narrative  . Not on file    Allergies:  Allergies  Allergen Reactions  . Latex Hives    Medications: Prior to Admission medications   Medication Sig Start Date End Date Taking? Authorizing Provider  cyclobenzaprine (FLEXERIL) 5  MG tablet 1 tablet every 8 hours as needed for muscle spasms 07/30/17  Yes Karamalegos, Devonne Doughty, DO  norethindrone (AYGESTIN) 5 MG tablet Take 1 tablet (5 mg total) by mouth daily. 10/29/17  Yes Malachy Mood, MD  cyanocobalamin (,VITAMIN B-12,) 1000 MCG/ML injection Inject 1 mL (106mcg) IM every 2 weeks for 2 doses, then monthly for 2 doses Patient not taking: Reported on 11/18/2018 07/15/18   Olin Hauser, DO  Vitamin D, Ergocalciferol, (DRISDOL) 50000 units CAPS capsule Take 1 capsule (50,000 Units total) by mouth every 7 (seven) days. For 3 months Patient not taking: Reported on 11/18/2018 07/15/18   Olin Hauser, DO    Physical Exam Vitals: Blood pressure (!) 146/90, pulse (!) 105, height 5' (1.524 m), weight 250 lb (113.4 kg).  General: NAD HEENT: normocephalic, anicteric Thyroid: no enlargement, no palpable nodules Pulmonary: No increased work of breathing, CTAB Cardiovascular: RRR, distal pulses 2+ Breast: Breast symmetrical, no tenderness, no palpable nodules or masses, no skin or nipple retraction present, no nipple discharge.  No axillary or supraclavicular lymphadenopathy. Abdomen: NABS, soft, non-tender, non-distended.  Umbilicus without lesions.  No hepatomegaly, splenomegaly or masses palpable. No evidence of hernia  Genitourinary:  External: Normal external female genitalia.  Normal urethral meatus, normal Bartholin's and Skene's glands.    Vagina: Normal vaginal mucosa, no evidence of prolapse.    Cervix: Grossly normal in appearance, no bleeding  Uterus: Non-enlarged, mobile, normal contour.  No CMT  Adnexa: ovaries non-enlarged, no adnexal masses  Rectal: deferred  Lymphatic: no evidence of inguinal lymphadenopathy Extremities: no edema, erythema, or tenderness Neurologic: Grossly intact Psychiatric: mood appropriate, affect full  Female chaperone present for pelvic and breast  portions of the physical exam    Assessment: 38 y.o.  [id number] routine annual exam  Plan: Problem List Items Addressed This Visit    None    Visit Diagnoses    Encounter for gynecological examination without abnormal finding    -  Primary   Screening for malignant neoplasm of cervix       Relevant Orders   Cytology - PAP   Breast screening       Routine screening for STI (sexually transmitted infection)       Relevant Orders   HEP, RPR, HIV Panel   Cytology - PAP      2) STI screening  wasoffered and accepted  2)  ASCCP guidelines and rational discussed.  Patient opts for every 3 years screening interval  3) Contraception - the patient is currently using  tubal ligation.  She is happy with her current form of contraception and plans to continue - also norethindrone for cycle controlled a presumptive endometriosis  4) Routine healthcare maintenance including cholesterol, diabetes screening discussed  managed by PCP  5) Return in about 1 year (around 11/19/2019) for annual.   Malachy Mood, MD, [place] 11/18/2018, 1:32 PM

## 2018-11-20 LAB — HEP, RPR, HIV PANEL
HEP B S AG: NEGATIVE
HIV SCREEN 4TH GENERATION: NONREACTIVE
RPR: NONREACTIVE

## 2018-11-22 LAB — CYTOLOGY - PAP
Chlamydia: NEGATIVE
Diagnosis: NEGATIVE
HPV: NOT DETECTED
Neisseria Gonorrhea: NEGATIVE
Trichomonas: NEGATIVE

## 2018-12-24 DIAGNOSIS — Z91040 Latex allergy status: Secondary | ICD-10-CM | POA: Diagnosis not present

## 2018-12-24 DIAGNOSIS — Z9049 Acquired absence of other specified parts of digestive tract: Secondary | ICD-10-CM | POA: Diagnosis not present

## 2018-12-24 DIAGNOSIS — M25461 Effusion, right knee: Secondary | ICD-10-CM | POA: Diagnosis not present

## 2018-12-24 DIAGNOSIS — E669 Obesity, unspecified: Secondary | ICD-10-CM | POA: Diagnosis not present

## 2018-12-24 DIAGNOSIS — I10 Essential (primary) hypertension: Secondary | ICD-10-CM | POA: Diagnosis not present

## 2018-12-24 DIAGNOSIS — Z87891 Personal history of nicotine dependence: Secondary | ICD-10-CM | POA: Diagnosis not present

## 2018-12-24 DIAGNOSIS — M25561 Pain in right knee: Secondary | ICD-10-CM | POA: Diagnosis not present

## 2019-01-03 DIAGNOSIS — M25561 Pain in right knee: Secondary | ICD-10-CM | POA: Diagnosis not present

## 2019-01-03 DIAGNOSIS — M25461 Effusion, right knee: Secondary | ICD-10-CM | POA: Diagnosis not present

## 2019-01-06 ENCOUNTER — Other Ambulatory Visit: Payer: 59

## 2019-01-06 DIAGNOSIS — E559 Vitamin D deficiency, unspecified: Secondary | ICD-10-CM | POA: Diagnosis not present

## 2019-01-06 DIAGNOSIS — R7303 Prediabetes: Secondary | ICD-10-CM | POA: Diagnosis not present

## 2019-01-06 DIAGNOSIS — E538 Deficiency of other specified B group vitamins: Secondary | ICD-10-CM | POA: Diagnosis not present

## 2019-01-07 DIAGNOSIS — M2241 Chondromalacia patellae, right knee: Secondary | ICD-10-CM | POA: Diagnosis not present

## 2019-01-07 DIAGNOSIS — M25661 Stiffness of right knee, not elsewhere classified: Secondary | ICD-10-CM | POA: Diagnosis not present

## 2019-01-07 DIAGNOSIS — M25461 Effusion, right knee: Secondary | ICD-10-CM | POA: Diagnosis not present

## 2019-01-07 DIAGNOSIS — S83241A Other tear of medial meniscus, current injury, right knee, initial encounter: Secondary | ICD-10-CM | POA: Diagnosis not present

## 2019-01-07 DIAGNOSIS — S83281A Other tear of lateral meniscus, current injury, right knee, initial encounter: Secondary | ICD-10-CM | POA: Diagnosis not present

## 2019-01-07 LAB — VITAMIN D 25 HYDROXY (VIT D DEFICIENCY, FRACTURES): Vit D, 25-Hydroxy: 19 ng/mL — ABNORMAL LOW (ref 30–100)

## 2019-01-07 LAB — HEMOGLOBIN A1C
EAG (MMOL/L): 6.6 (calc)
HEMOGLOBIN A1C: 5.8 %{Hb} — AB (ref <5.7)
MEAN PLASMA GLUCOSE: 120 (calc)

## 2019-01-07 LAB — INSULIN, RANDOM: Insulin: 21.5 u[IU]/mL — ABNORMAL HIGH

## 2019-01-07 LAB — VITAMIN B12: Vitamin B-12: 283 pg/mL (ref 200–1100)

## 2019-01-13 ENCOUNTER — Other Ambulatory Visit: Payer: Self-pay

## 2019-01-13 ENCOUNTER — Encounter: Payer: Self-pay | Admitting: Family Medicine

## 2019-01-13 ENCOUNTER — Ambulatory Visit (INDEPENDENT_AMBULATORY_CARE_PROVIDER_SITE_OTHER): Payer: 59 | Admitting: Family Medicine

## 2019-01-13 VITALS — BP 142/83 | HR 96 | Temp 98.4°F | Ht 60.0 in | Wt 252.8 lb

## 2019-01-13 DIAGNOSIS — E559 Vitamin D deficiency, unspecified: Secondary | ICD-10-CM

## 2019-01-13 DIAGNOSIS — G43709 Chronic migraine without aura, not intractable, without status migrainosus: Secondary | ICD-10-CM | POA: Diagnosis not present

## 2019-01-13 DIAGNOSIS — R7303 Prediabetes: Secondary | ICD-10-CM | POA: Diagnosis not present

## 2019-01-13 DIAGNOSIS — G8929 Other chronic pain: Secondary | ICD-10-CM

## 2019-01-13 DIAGNOSIS — Z6841 Body Mass Index (BMI) 40.0 and over, adult: Secondary | ICD-10-CM | POA: Diagnosis not present

## 2019-01-13 DIAGNOSIS — M5442 Lumbago with sciatica, left side: Secondary | ICD-10-CM

## 2019-01-13 DIAGNOSIS — IMO0002 Reserved for concepts with insufficient information to code with codable children: Secondary | ICD-10-CM

## 2019-01-13 DIAGNOSIS — M25561 Pain in right knee: Secondary | ICD-10-CM | POA: Diagnosis not present

## 2019-01-13 DIAGNOSIS — I10 Essential (primary) hypertension: Secondary | ICD-10-CM

## 2019-01-13 DIAGNOSIS — M25562 Pain in left knee: Secondary | ICD-10-CM

## 2019-01-13 MED ORDER — CYCLOBENZAPRINE HCL 5 MG PO TABS: 5.0000 mg | ORAL_TABLET | Freq: Every evening | ORAL | 2 refills | Status: DC | PRN

## 2019-01-13 MED ORDER — VITAMIN D (ERGOCALCIFEROL) 1.25 MG (50000 UNIT) PO CAPS: 50000.0000 [IU] | ORAL_CAPSULE | ORAL | 0 refills | Status: DC

## 2019-01-13 NOTE — Assessment & Plan Note (Signed)
Still low Vit D Likely factor with obesity Restart Vitamin D3 50,000 weekly iu for 12 weeks - sent order

## 2019-01-13 NOTE — Assessment & Plan Note (Signed)
Slightly increased A1c to 5.8 Mild elevated random insulin again - some improved still from previous  Plan Encourage improve lifestyle diet/exercise Remain off med Follow-up 3 months A1c  Referral to [facility]

## 2019-01-13 NOTE — Assessment & Plan Note (Signed)
No active migraine Chronic problem, seems increasing frequency Failed Sumatriptan, Maxalt, failed excedrin, rx NSAIDs. For prophylaxis failed Nortriptyline.  Plan May return to [facility] to follow-up further medication options - advised her future CGRP injection is good prophylaxis option, other future oral ditriptan med Reyvow is an option as well for abortive  - Agree to complete Intermittent FMLA for chronic migraines, anticipated timing would be up to 2 episodes per 3 months, 1-3 days per episode - await paperwork

## 2019-01-13 NOTE — Assessment & Plan Note (Signed)
Elevated BP Complicated by acute knee pain Morbid obesity  Plan Encourage improving lifestyle / wt loss / referral to [place] may need to restart anti HTN therapy in future Follow-up closely

## 2019-01-13 NOTE — Progress Notes (Signed)
Subjective:    Patient ID: Stacey Taylor, female    DOB: 1981/04/04, 38 y.o.   MRN: [id number]  Stacey Taylor is a 38 y.o. female presenting on 01/13/2019 for Pre-Diabetes; Obesity; and Migraine   HPI   Migraines, chronic Established with [facility], last seen 09/2018, by Dr Melrose Nakayama, was treated chronic migraine with prophylaxis Nortriptyline 10mg  up to 20mg  nightly, and also switched to Maxalt PRN abortive therapy. She has since discontinued these medications. - Today she reports that she is interested to request FMLA intermittent leave for severe headache episodes, she may have severe migraine about 1-2 times every 2-3 months, and worse with stress and season change and weather - Now improved PRN on Flexeril muscle relaxant 5mg  nightly PRN if headache - Has not tried injectable CGRP medication yet - Failed Imitrex, NSAIDs, Excedrin migraine, Nortriptyline, Maxalt Denies active headache, nausea vomiting numbness weakness tingling  FOLLOW-UPMorbid Obesity BMI >49/Abnormal WeightGain/ LE Edema Previously followed by [facility], and then decided continue her own lifestyle interventions after difficulty losing weight - Today now interested in referral to [facility] - due to her complications with her obesity, joint pain, hypertension and pre-diabetes. - Weight gain since last visit - Does admit to limited exercisestill, but often stays active with walking on job  Vitamin D Deficiency / Vitamin B12 Deficiency Last lab showed improved Vitamin B12 now, after injection and oral supplement still daily Her Vitamin D did not improve, and she would need treatment again. Still admits mild reduced energy but improved now  Pre-Diabetes: Improved A1c stable at 5.6. Last lab showed A1c 5.8, slightly increased CBGs:not checking Meds: OFF Metformin  Right Knee Pain, Swelling Followed by [facility], prior seen by [facility], limited results in past had  knee drained and NSAIDs, fluid aspiration was not indicative of infection there was concern for some contaminate. Now continues with Dr [facility], uncertain why knee effusion, proceeded to MRI showed partial meniscus tear injury see below, some cartilage wear on knee and effusion - Now she anticipates future labs for eval of possible Rheumatoid Arthritis from [facility], review results, then consider consult for Arthroscopic meniscus treatment is indicated for her, she hopes to lose weight as well and will request bariatric surgery - Also hand swelling episodic Denies redness of knee new fall injury other joint pain currently  Depression screen [facility] 2/9 01/13/2019 07/15/2018 03/11/2018  Decreased Interest 0 0 0  Down, Depressed, Hopeless 0 0 0  PHQ - 2 Score 0 0 0  Altered sleeping - - 0  Tired, decreased energy - - 0  Change in appetite - - 0  Feeling bad or failure about yourself  - - 0  Trouble concentrating - - 0  Moving slowly or fidgety/restless - - 0  Suicidal thoughts - - 0  PHQ-9 Score - - 0  Difficult doing work/chores - - Not difficult at all    Social History   Tobacco Use  . Smoking status: Never Smoker  . Smokeless tobacco: Never Used  Substance Use Topics  . Alcohol use: No    Alcohol/week: 0.0 standard drinks  . Drug use: No    Review of Systems Per HPI unless specifically indicated above     Objective:    BP (!) 142/83 (BP Location: Left Arm, Patient Position: Sitting, Cuff Size: Normal)   Pulse 96   Temp 98.4 F (36.9 C) (Oral)   Ht 5' (1.524 m)   Wt 252  lb 12.8 oz (114.7 kg)   BMI 49.37 kg/m   Wt Readings from Last 3 Encounters:  01/13/19 252 lb 12.8 oz (114.7 kg)  11/18/18 250 lb (113.4 kg)  09/05/18 244 lb (110.7 kg)    Physical Exam Vitals signs and nursing note reviewed.  Constitutional:      General: She is not in acute distress.    Appearance: She is well-developed. She is not diaphoretic.     Comments: Well-appearing, comfortable,  cooperative, morbidly obese  HENT:     Head: Normocephalic and atraumatic.  Eyes:     General:        Right eye: No discharge.        Left eye: No discharge.     Conjunctiva/sclera: Conjunctivae normal.  Cardiovascular:     Rate and Rhythm: Normal rate.  Pulmonary:     Effort: Pulmonary effort is normal.  Musculoskeletal:     Comments: Bilateral knee with bulky appearance, soft tissue edema R>L  Skin:    General: Skin is warm and dry.     Findings: No erythema or rash.  Neurological:     Mental Status: She is alert and oriented to person, place, and time.  Psychiatric:        Behavior: Behavior normal.     Comments: Well groomed, good eye contact, normal speech and thoughts    I have personally reviewed the radiology report from 01/10/19 R knee MRI.  MRI Lower Extremity Joint Right Wo Contrast3/07/2019 [facility] Result Impression   1. Partial-thickness upper surface vertical longitudinal tear anterior horn lateral meniscus, right knee. 2. Tiny horizontal undersurface tear posterior horn medial meniscus. 3. Mild tricompartmental chondrosis. 4. Moderate size knee effusion. 5. Intact ligaments.  Result Narrative  EXAM: MRI LOWER EXTREMITY JOINT RIGHT WO CONTRAST DATE: 01/07/2019 6:32 PM ACCESSION: [id number] UN DICTATED: 01/10/2019 9:19 AM INTERPRETATION LOCATION: [place]  CLINICAL INDICATION: 38 years old Female with EVal knee effusion ; Knee trauma, tenderness or effusion, initial exam (Age => 1y) ; Knee pain, effusion or neg xray - M25.561 - Right knee pain, unspecified chronicity   COMPARISON: Right knee radiographs dated 12/24/2018.  TECHNIQUE: MRI of the right knee was performed without contrast using a local coil. Multisequence, multiplanar images were obtained.  FINDINGS:  Bone marrow (excluding subchondral bone): Normal.  Medial compartment: A tiny horizontal undersurface tear of the posterior horn of the medial meniscus is present. Mild weightbearing  region chondrosis is present. The subchondral bone is normal.  Lateral compartment: A partial-thickness vertical longitudinal upper surface tear of the anterior horn lateral meniscus is present that extends into the anterior root. Mild weightbearing region chondrosis is present. The subchondral bone is normal.  Patellofemoral compartment: Slight lateral subluxation of the patella is noted. Mild patellar chondrosis is present. Femoral trochlear cartilage appears normal. The subchondral bone is normal.  The ligaments of the knee are intact. The extensor mechanism is normal. The medial and lateral patellar retinacula are normal. The popliteus tendon is normal.  A moderate size knee effusion is present. A nascent popliteal cyst is present. There are no loose intra-articular bodies.  Fat pads and other periarticular soft tissues are normal.     Results for orders placed or performed in visit on 11/18/18  HEP, RPR, HIV Panel  Result Value Ref Range   Hepatitis B Surface Ag Negative Negative   RPR Ser Ql Non Reactive Non Reactive   HIV Screen 4th Generation wRfx Non Reactive Non Reactive  Cytology - PAP  Result Value Ref Range   Adequacy      Satisfactory for evaluation  endocervical/transformation zone component PRESENT.   Diagnosis      NEGATIVE FOR INTRAEPITHELIAL LESIONS OR MALIGNANCY.   Chlamydia Negative    Neisseria gonorrhea Negative    HPV NOT DETECTED    Trichomonas Negative    Material Submitted CervicoVaginal Pap [ThinPrep Imaged]    CYTOLOGY - PAP PAP RESULT       Assessment & Plan:   Problem List Items Addressed This Visit    Chronic low back pain with left-sided sciatica   Relevant Medications   cyclobenzaprine (FLEXERIL) 5 MG tablet   Chronic migraine - Primary    No active migraine Chronic problem, seems increasing frequency Failed Sumatriptan, Maxalt, failed excedrin, rx NSAIDs. For prophylaxis failed Nortriptyline.  Plan May return to [facility] to  follow-up further medication options - advised her future CGRP injection is good prophylaxis option, other future oral ditriptan med Reyvow is an option as well for abortive  - Agree to complete Intermittent FMLA for chronic migraines, anticipated timing would be up to 2 episodes per 3 months, 1-3 days per episode - await paperwork      Relevant Medications   cyclobenzaprine (FLEXERIL) 5 MG tablet   Essential hypertension    Elevated BP Complicated by acute knee pain Morbid obesity  Plan Encourage improving lifestyle / wt loss / referral to [place] may need to restart anti HTN therapy in future Follow-up closely      Relevant Orders   Amb Referral to Bariatric Surgery   Morbid obesity with BMI of 45.0-49.9, adult ([place])    Abnormal weight gain still, morbid obese BMI >54 Complications with hypertension, pre-diabetes, chronic knee pain Unsucessful [facility]  Encourage keep improving diet - emphasis, since some limited with knees  Proceed with consultation from [facility] - referral placed.      Relevant Orders   Amb Referral to Bariatric Surgery   Pre-diabetes    Slightly increased A1c to 5.8 Mild elevated random insulin again - some improved still from previous  Plan Encourage improve lifestyle diet/exercise Remain off med Follow-up 3 months A1c  Referral to [facility]      Relevant Orders   Amb Referral to Bariatric Surgery   Vitamin D deficiency    Still low Vit D Likely factor with obesity Restart Vitamin D3 50,000 weekly iu for 12 weeks - sent order      Relevant Medications   Vitamin D, Ergocalciferol, (DRISDOL) 1.25 MG (50000 UT) CAPS capsule    Other Visit Diagnoses    Bilateral chronic knee pain       Relevant Medications   cyclobenzaprine (FLEXERIL) 5 MG tablet   Other Relevant Orders   Amb Referral to Bariatric Surgery      Orders Placed This Encounter  Procedures  . Amb Referral to Bariatric Surgery     Referral Priority:   Routine    Referral Type:   Consultation    Referred to Provider:   Nicki Guadalajara    Number of Visits Requested:   1     Meds ordered this encounter  Medications  . cyclobenzaprine (FLEXERIL) 5 MG tablet    Sig: Take 1 tablet (5 mg total) by mouth at bedtime as needed for muscle spasms (migraine).    Dispense:  30 tablet    Refill:  2  . Vitamin D, Ergocalciferol, (DRISDOL) 1.25 MG (50000 UT) CAPS capsule  Sig: Take 1 capsule (50,000 Units total) by mouth every 7 (seven) days. For 3 months    Dispense:  12 capsule    Refill:  0    Follow up plan: Return in about 3 months (around 04/15/2019) for PreDM A1c, Weight, Migraines.  Nobie Putnam, [facility] 01/13/2019, 10:39 AM

## 2019-01-13 NOTE — Patient Instructions (Addendum)
Thank you for coming to the office today.  Refer to [facility] - stay tuned  Follow-up with [facility] for knee, and lab results  Restart Vitamin D3 50k weekly - new rx sent  Re order Flexeril PRN migraine  Send [place] paper from [employer] for Intermittent FMLA for migraine  Sugar is stable A1c  Recent Labs    03/09/18 1141 07/08/18 0827 01/06/19 0821  HGBA1C 5.6 5.6 5.8*     Please schedule a Follow-up Appointment to: Return in about 3 months (around 04/15/2019) for PreDM A1c, Weight, Migraines.  If you have any other questions or concerns, please feel free to call the office or send a message through [place]. You may also schedule an earlier appointment if necessary.  Additionally, you may be receiving a survey about your experience at our office within a few days to 1 week by e-mail or mail. We value your feedback.  Nobie Putnam, DO [place]

## 2019-01-13 NOTE — Assessment & Plan Note (Signed)
Abnormal weight gain still, morbid obese BMI >97 Complications with hypertension, pre-diabetes, chronic knee pain Unsucessful [facility]  Encourage keep improving diet - emphasis, since some limited with knees  Proceed with consultation from [facility] - referral placed.

## 2019-01-14 DIAGNOSIS — M25561 Pain in right knee: Secondary | ICD-10-CM | POA: Diagnosis not present

## 2019-01-14 DIAGNOSIS — M25461 Effusion, right knee: Secondary | ICD-10-CM | POA: Diagnosis not present

## 2019-01-21 ENCOUNTER — Ambulatory Visit: Payer: 59 | Admitting: Family Medicine

## 2019-02-17 DIAGNOSIS — M25461 Effusion, right knee: Secondary | ICD-10-CM | POA: Diagnosis not present

## 2019-04-14 DIAGNOSIS — Z87891 Personal history of nicotine dependence: Secondary | ICD-10-CM | POA: Diagnosis not present

## 2019-04-14 DIAGNOSIS — M25562 Pain in left knee: Secondary | ICD-10-CM | POA: Diagnosis not present

## 2019-04-14 DIAGNOSIS — S83005A Unspecified dislocation of left patella, initial encounter: Secondary | ICD-10-CM | POA: Diagnosis not present

## 2019-04-14 DIAGNOSIS — S83289A Other tear of lateral meniscus, current injury, unspecified knee, initial encounter: Secondary | ICD-10-CM | POA: Diagnosis not present

## 2019-04-14 DIAGNOSIS — M25561 Pain in right knee: Secondary | ICD-10-CM | POA: Diagnosis not present

## 2019-04-14 DIAGNOSIS — M170 Bilateral primary osteoarthritis of knee: Secondary | ICD-10-CM | POA: Diagnosis not present

## 2019-04-14 DIAGNOSIS — G8929 Other chronic pain: Secondary | ICD-10-CM | POA: Diagnosis not present

## 2019-04-26 DIAGNOSIS — L814 Other melanin hyperpigmentation: Secondary | ICD-10-CM | POA: Diagnosis not present

## 2019-04-26 DIAGNOSIS — D1730 Benign lipomatous neoplasm of skin and subcutaneous tissue of unspecified sites: Secondary | ICD-10-CM | POA: Diagnosis not present

## 2019-04-26 DIAGNOSIS — D485 Neoplasm of uncertain behavior of skin: Secondary | ICD-10-CM | POA: Diagnosis not present

## 2019-04-26 DIAGNOSIS — L738 Other specified follicular disorders: Secondary | ICD-10-CM | POA: Diagnosis not present

## 2019-04-27 DIAGNOSIS — R262 Difficulty in walking, not elsewhere classified: Secondary | ICD-10-CM | POA: Diagnosis not present

## 2019-05-11 DIAGNOSIS — G8929 Other chronic pain: Secondary | ICD-10-CM | POA: Diagnosis not present

## 2019-05-11 DIAGNOSIS — M25561 Pain in right knee: Secondary | ICD-10-CM | POA: Diagnosis not present

## 2019-05-11 DIAGNOSIS — M25562 Pain in left knee: Secondary | ICD-10-CM | POA: Diagnosis not present

## 2019-05-13 DIAGNOSIS — G8929 Other chronic pain: Secondary | ICD-10-CM | POA: Diagnosis not present

## 2019-05-13 DIAGNOSIS — M25561 Pain in right knee: Secondary | ICD-10-CM | POA: Diagnosis not present

## 2019-05-13 DIAGNOSIS — M25562 Pain in left knee: Secondary | ICD-10-CM | POA: Diagnosis not present

## 2019-05-18 DIAGNOSIS — M25562 Pain in left knee: Secondary | ICD-10-CM | POA: Diagnosis not present

## 2019-05-18 DIAGNOSIS — M25561 Pain in right knee: Secondary | ICD-10-CM | POA: Diagnosis not present

## 2019-05-18 DIAGNOSIS — G8929 Other chronic pain: Secondary | ICD-10-CM | POA: Diagnosis not present

## 2019-05-20 DIAGNOSIS — M25561 Pain in right knee: Secondary | ICD-10-CM | POA: Diagnosis not present

## 2019-05-20 DIAGNOSIS — G8929 Other chronic pain: Secondary | ICD-10-CM | POA: Diagnosis not present

## 2019-05-20 DIAGNOSIS — M25562 Pain in left knee: Secondary | ICD-10-CM | POA: Diagnosis not present

## 2019-05-25 DIAGNOSIS — M25562 Pain in left knee: Secondary | ICD-10-CM | POA: Diagnosis not present

## 2019-05-25 DIAGNOSIS — M25561 Pain in right knee: Secondary | ICD-10-CM | POA: Diagnosis not present

## 2019-05-25 DIAGNOSIS — G8929 Other chronic pain: Secondary | ICD-10-CM | POA: Diagnosis not present

## 2019-06-01 DIAGNOSIS — G8929 Other chronic pain: Secondary | ICD-10-CM | POA: Diagnosis not present

## 2019-06-01 DIAGNOSIS — M25562 Pain in left knee: Secondary | ICD-10-CM | POA: Diagnosis not present

## 2019-06-01 DIAGNOSIS — M25561 Pain in right knee: Secondary | ICD-10-CM | POA: Diagnosis not present

## 2019-06-03 DIAGNOSIS — M25562 Pain in left knee: Secondary | ICD-10-CM | POA: Diagnosis not present

## 2019-06-03 DIAGNOSIS — G8929 Other chronic pain: Secondary | ICD-10-CM | POA: Diagnosis not present

## 2019-06-03 DIAGNOSIS — M25561 Pain in right knee: Secondary | ICD-10-CM | POA: Diagnosis not present

## 2019-06-09 DIAGNOSIS — M25561 Pain in right knee: Secondary | ICD-10-CM | POA: Diagnosis not present

## 2019-06-09 DIAGNOSIS — M25562 Pain in left knee: Secondary | ICD-10-CM | POA: Diagnosis not present

## 2019-06-09 DIAGNOSIS — G8929 Other chronic pain: Secondary | ICD-10-CM | POA: Diagnosis not present

## 2019-06-10 DIAGNOSIS — M25561 Pain in right knee: Secondary | ICD-10-CM | POA: Diagnosis not present

## 2019-06-13 DIAGNOSIS — M25562 Pain in left knee: Secondary | ICD-10-CM | POA: Diagnosis not present

## 2019-06-13 DIAGNOSIS — M25561 Pain in right knee: Secondary | ICD-10-CM | POA: Diagnosis not present

## 2019-06-13 DIAGNOSIS — S83005D Unspecified dislocation of left patella, subsequent encounter: Secondary | ICD-10-CM | POA: Diagnosis not present

## 2019-06-13 DIAGNOSIS — H5213 Myopia, bilateral: Secondary | ICD-10-CM | POA: Diagnosis not present

## 2019-06-13 DIAGNOSIS — G8929 Other chronic pain: Secondary | ICD-10-CM | POA: Diagnosis not present

## 2019-06-13 DIAGNOSIS — H04123 Dry eye syndrome of bilateral lacrimal glands: Secondary | ICD-10-CM | POA: Diagnosis not present

## 2019-06-17 DIAGNOSIS — M170 Bilateral primary osteoarthritis of knee: Secondary | ICD-10-CM | POA: Insufficient documentation

## 2019-06-24 DIAGNOSIS — M25561 Pain in right knee: Secondary | ICD-10-CM | POA: Diagnosis not present

## 2019-06-25 DIAGNOSIS — M25561 Pain in right knee: Secondary | ICD-10-CM | POA: Diagnosis not present

## 2019-07-11 DIAGNOSIS — M25561 Pain in right knee: Secondary | ICD-10-CM | POA: Diagnosis not present

## 2019-07-12 ENCOUNTER — Encounter: Payer: Self-pay | Admitting: Family Medicine

## 2019-07-12 ENCOUNTER — Ambulatory Visit (INDEPENDENT_AMBULATORY_CARE_PROVIDER_SITE_OTHER): Payer: 59 | Admitting: Family Medicine

## 2019-07-12 ENCOUNTER — Other Ambulatory Visit: Payer: Self-pay

## 2019-07-12 VITALS — BP 124/84 | HR 93 | Temp 98.6°F | Resp 16 | Ht 60.0 in | Wt 275.6 lb

## 2019-07-12 DIAGNOSIS — Z6841 Body Mass Index (BMI) 40.0 and over, adult: Secondary | ICD-10-CM | POA: Diagnosis not present

## 2019-07-12 DIAGNOSIS — R7303 Prediabetes: Secondary | ICD-10-CM | POA: Diagnosis not present

## 2019-07-12 DIAGNOSIS — R600 Localized edema: Secondary | ICD-10-CM

## 2019-07-12 MED ORDER — POTASSIUM CHLORIDE ER 10 MEQ PO TBCR: 10.0000 meq | EXTENDED_RELEASE_TABLET | Freq: Every day | ORAL | 1 refills | Status: DC

## 2019-07-12 MED ORDER — FUROSEMIDE 20 MG PO TABS: 20.0000 mg | ORAL_TABLET | Freq: Every day | ORAL | 2 refills | Status: DC | PRN

## 2019-07-12 NOTE — Progress Notes (Signed)
Subjective:    Patient ID: Stacey Taylor, female    DOB: 1981/04/07, 38 y.o.   MRN: [id number]  Stacey Taylor is a 37 y.o. female presenting on 07/12/2019 for Edema and Weight Gain   HPI   Chronic Lower Extremity vs Generalized Edema - Last visit with me for same problem in 2018 approx 2 years ago, treatment plans included trial of HCTZ at that time and also a referral to Vascular, however she did not follow-up with these treatment plans - did not start HCTZ and did not see vascular specialist, see prior notes for background information. - Interval update with persistent problem, without relief, seems to be worsening now lately - Today patient reports she has concern with edema, and would like medication and further evaluation. She has family history of similar circulation issues in lower extremity in edema with sister, who also takes diuretic medication. She says she has had similar problem since younger age, worse following pregnancy x 2. - She says fluid not as severe in morning, usually worsening through day Right leg is worse than Left Admits difficulty extending legs or moving if too stiff or too swollen Denies any numbness tingling weakness currently, redness leg pain  Knee Injury / Meniscus - Out of work since March 2020 due to [employer] restrictions - Physical therapy for knees, bilateral meniscus issue, had R knee problem considering procedure  Abnormal Weight Has tried phentermine but did not want to take this due to side effect and felt jittery, expressed that we do not rx this medication here. She was not sure what weight options there were - We referred to [facility] on 01/13/19 - she never heard back, hasn't called  Health Maintenance: She declines flu shot today will get later in season.  Depression screen [facility] 2/9 07/12/2019 01/13/2019 07/15/2018  Decreased Interest 0 0 0  Down, Depressed, Hopeless 0 0 0  PHQ - 2 Score 0 0 0  Altered sleeping - - -  Tired, decreased  energy - - -  Change in appetite - - -  Feeling bad or failure about yourself  - - -  Trouble concentrating - - -  Moving slowly or fidgety/restless - - -  Suicidal thoughts - - -  PHQ-9 Score - - -  Difficult doing work/chores - - -    Social History   Tobacco Use  . Smoking status: Never Smoker  . Smokeless tobacco: Never Used  Substance Use Topics  . Alcohol use: No    Alcohol/week: 0.0 standard drinks  . Drug use: No    Review of Systems Per HPI unless specifically indicated above     Objective:    BP 124/84 (BP Location: Left Arm, Cuff Size: Normal)   Pulse 93   Temp 98.6 F (37 C) (Oral)   Resp 16   Ht 5' (1.524 m)   Wt 275 lb 9.6 oz (125 kg)   BMI 53.82 kg/m   Wt Readings from Last 3 Encounters:  07/12/19 275 lb 9.6 oz (125 kg)  01/13/19 252 lb 12.8 oz (114.7 kg)  11/18/18 250 lb (113.4 kg)    Physical Exam Vitals signs and nursing note reviewed.  Constitutional:      General: She is not in acute distress.    Appearance: She is well-developed. She is not diaphoretic.     Comments: Well-appearing, comfortable, cooperative  HENT:     Head: Normocephalic and atraumatic.  Eyes:     General:  Right eye: No discharge.        Left eye: No discharge.     Conjunctiva/sclera: Conjunctivae normal.  Neck:     Musculoskeletal: Normal range of motion and neck supple.     Thyroid: No thyromegaly.  Cardiovascular:     Rate and Rhythm: Normal rate and regular rhythm.     Heart sounds: Normal heart sounds. No murmur.  Pulmonary:     Effort: Pulmonary effort is normal. No respiratory distress.     Breath sounds: Normal breath sounds. No wheezing or rales.  Musculoskeletal: Normal range of motion.     Right lower leg: Edema present.     Left lower leg: Edema present.     Comments: Non pitting edema, seems persistent symmetrical generalized  Lymphadenopathy:     Cervical: No cervical adenopathy.  Skin:    General: Skin is warm and dry.     Findings: No  erythema or rash.  Neurological:     Mental Status: She is alert and oriented to person, place, and time.  Psychiatric:        Behavior: Behavior normal.     Comments: Well groomed, good eye contact, normal speech and thoughts       ECHOCARDIOGRAM COMPLETE (Accession [id number]) (Order [id number]) Echocardiography Date: 02/04/2018 Department: [place] Released By: Eli Phillips, NT Authorizing: Eber Jones, MD  ECHOCARDIOGRAM COMPLETE Order #: [id number] Accession #: [id number] Patient Info  Patient name: Stacey Taylor  MRN: [id number]  Age: 38 y.o.  Sex: female  Vitals  BP Height Weight BSA (Calculated - sq m)       Study Result  Result status: Final result                           *[facility]*                  [address] [place]                        [place], [place] [address]                            [phone]  ------------------------------------------------------------------- Transthoracic Echocardiography  Patient:    Stacey Taylor MR #:       [id number] Study Date: 02/04/2018 Gender:     F Age:        36 Height:     152.4 cm Weight:     110 kg BSA:        2.23 m^2 Pt. Status: Room:   ATTENDING    Default, Provider [id number]  PERFORMING   Oak Taylor, [place]  SONOGRAPHER  Pilar Jarvis, RVT, [place], RDMS  ORDERING     Adair Patter, [place]  cc:  ------------------------------------------------------------------- LV EF: 55% -   60%  ------------------------------------------------------------------- History:   PMH:   Dyspnea, murmur, and bilateral lower extremity edema.  Risk factors:  Hypertension. Obese.  ------------------------------------------------------------------- Study Conclusions  - Left ventricle: The cavity size was normal. Systolic function was   normal. The estimated ejection fraction was in the range of 55%   to 60%. Wall motion was normal;  there were no regional wall   motion abnormalities. Left ventricular diastolic function   parameters were normal. - Left atrium: The atrium was normal in size. - Right ventricle: Systolic  function was normal. - Pulmonary arteries: Systolic pressure was within the normal   range.  Impressions:  - Normal study.  ------------------------------------------------------------------- Study data:  No prior study was available for comparison.  Study status:  Routine.  Procedure:  Poor parasternal images. Transthoracic echocardiography. Image quality was fair.  Study completion:  There were no complications.          Transthoracic echocardiography.  M-mode, complete 2D, spectral Doppler, and color Doppler.  Birthdate:  Patient birthdate: 1981/05/15.  Age:  Patient is 38 yr old.  Sex:  Gender: female.    BMI: 47.4 kg/m^2.  Blood pressure:     138/80  Patient status:  Outpatient.  Study date: Study date: 02/04/2018. Study time: 10:44 AM.  -------------------------------------------------------------------  ------------------------------------------------------------------- Left ventricle:  The cavity size was normal. Systolic function was normal. The estimated ejection fraction was in the range of 55% to 60%. Wall motion was normal; there were no regional wall motion abnormalities. The transmitral flow pattern was normal. The deceleration time of the early transmitral flow velocity was normal. The pulmonary vein flow pattern was normal. The tissue Doppler parameters were normal. Left ventricular diastolic function parameters were normal.  ------------------------------------------------------------------- Aortic valve:   Trileaflet; normal thickness leaflets. Mobility was not restricted.  Doppler:  Transvalvular velocity was within the normal range. There was no stenosis. There was no regurgitation. VTI ratio of LVOT to aortic valve: 0.84. Valve area (VTI): 2.13 cm^2. Indexed valve  area (VTI): 0.96 cm^2/m^2. Mean velocity ratio of LVOT to aortic valve: 0.83. Valve area (Vmean): 2.1 cm^2. Indexed valve area (Vmean): 0.94 cm^2/m^2.    Mean gradient (S): 4 mm Hg.  ------------------------------------------------------------------- Aorta:  Aortic root: The aortic root was normal in size.  ------------------------------------------------------------------- Mitral valve:   Structurally normal valve.   Mobility was not restricted.  Doppler:  Transvalvular velocity was within the normal range. There was no evidence for stenosis. There was no regurgitation.    Valve area by pressure half-time: 4.4 cm^2. Indexed valve area by pressure half-time: 1.97 cm^2/m^2.    Peak gradient (D): 5 mm Hg.  ------------------------------------------------------------------- Left atrium:  The atrium was normal in size.  ------------------------------------------------------------------- Right ventricle:  The cavity size was normal. Wall thickness was normal. Systolic function was normal.  ------------------------------------------------------------------- Pulmonic valve:    Structurally normal valve.   Cusp separation was normal.  Doppler:  Transvalvular velocity was within the normal range. There was no evidence for stenosis. There was no regurgitation.  ------------------------------------------------------------------- Tricuspid valve:   Structurally normal valve.    Doppler: Transvalvular velocity was within the normal range. There was trivial regurgitation.  ------------------------------------------------------------------- Pulmonary artery:   The main pulmonary artery was normal-sized. Systolic pressure was within the normal range.  ------------------------------------------------------------------- Right atrium:  The atrium was normal in size.  ------------------------------------------------------------------- Pericardium:  There was no pericardial effusion.   ------------------------------------------------------------------- Systemic veins: Inferior vena cava: The vessel was normal in size.  ------------------------------------------------------------------- Measurements   Left ventricle                           Value          Reference  LV ID, ED, PLAX chordal                  43    mm       43 - 52  LV ID, ES, PLAX chordal  28    mm       23 - 38  LV fx shortening, PLAX chordal           35    %        >=29  LV PW thickness, ED                      12    mm       ----------  IVS/LV PW ratio, ED                      1              <=1.3  Stroke volume, 2D                        55    ml       ----------  Stroke volume/bsa, 2D                    25    ml/m^2   ----------  LV e&', lateral                           12.1  cm/s     ----------  LV E/e&', lateral                         9.17           ----------  LV e&', medial                            7.29  cm/s     ----------  LV E/e&', medial                          15.23          ----------  LV e&', average                           9.7   cm/s     ----------  LV E/e&', average                         11.45          ----------    Ventricular septum                       Value          Reference  IVS thickness, ED                        12    mm       ----------    LVOT                                     Value          Reference  LVOT ID, S                               18    mm       ----------  LVOT area                                2.54  cm^2     ----------  LVOT ID                                  18    mm       ----------  LVOT mean velocity, S                    80.2  cm/s     ----------  LVOT VTI, S                              21.6  cm       ----------  Stroke volume (SV), LVOT DP              55    ml       ----------  Stroke index (SV/bsa), LVOT DP           24.7  ml/m^2   ----------    Aortic valve                             Value          Reference   Aortic valve mean velocity, S            97.2  cm/s     ----------  Aortic valve VTI, S                      25.8  cm       ----------  Aortic mean gradient, S                  4     mm Hg    ----------  VTI ratio, LVOT/AV                       0.84           ----------  Aortic valve area, VTI                   2.13  cm^2     ----------  Aortic valve area/bsa, VTI               0.96  cm^2/m^2 ----------  Velocity ratio, mean, LVOT/AV            0.83           ----------  Aortic valve area, mean velocity         2.1   cm^2     ----------  Aortic valve area/bsa, mean              0.94  cm^2/m^2 ----------  velocity    Aorta                                    Value          Reference  Aortic root ID, ED  27    mm       ----------  Ascending aorta ID, A-P, S               32    mm       ----------    Left atrium                              Value          Reference  LA ID, A-P, ES                           37    mm       ----------  LA ID/bsa, A-P                           1.66  cm/m^2   <=2.2  LA volume, S                             48.4  ml       ----------  LA volume/bsa, S                         21.7  ml/m^2   ----------  LA volume, ES, 1-p A4C                   41.8  ml       ----------  LA volume/bsa, ES, 1-p A4C               18.7  ml/m^2   ----------  LA volume, ES, 1-p A2C                   52.7  ml       ----------  LA volume/bsa, ES, 1-p A2C               23.6  ml/m^2   ----------    Mitral valve                             Value          Reference  Mitral E-wave peak velocity              111   cm/s     ----------  Mitral A-wave peak velocity              88.8  cm/s     ----------  Mitral deceleration time                 169   ms       150 - 230  Mitral pressure half-time                50    ms       ----------  Mitral peak gradient, D                  5     mm Hg    ----------  Mitral E/A ratio, peak                   1.3            ----------  Mitral  valve area, PHT,  DP               4.4   cm^2     ----------  Mitral valve area/bsa, PHT, DP           1.97  cm^2/m^2 ----------    Right atrium                             Value          Reference  RA ID, S-I, ES, A4C              (H)     55.2  mm       34 - 49  RA area, ES, A4C                         12.1  cm^2     8.3 - 19.5  RA volume, ES, A/L                       21.7  ml       ----------  RA volume/bsa, ES, A/L                   9.7   ml/m^2   ----------    Right ventricle                          Value          Reference  TAPSE                                    29.6  mm       ----------  RV s&', lateral, S                        14.9  cm/s     ----------    Pulmonic valve                           Value          Reference  Pulmonic valve peak velocity, S          90.2  cm/s     ----------  Legend: (L)  and  (H)  mark values outside specified reference range.  ------------------------------------------------------------------- Prepared and Electronically Authenticated by  Esmond Plants, MD, [facility] 2019-04-04T17:25:58     Results for orders placed or performed in visit on 11/18/18  HEP, RPR, HIV Panel  Result Value Ref Range   Hepatitis B Surface Ag Negative Negative   RPR Ser Ql Non Reactive Non Reactive   HIV Screen 4th Generation wRfx Non Reactive Non Reactive  Cytology - PAP  Result Value Ref Range   Adequacy      Satisfactory for evaluation  endocervical/transformation zone component PRESENT.   Diagnosis      NEGATIVE FOR INTRAEPITHELIAL LESIONS OR MALIGNANCY.   Chlamydia Negative    Neisseria gonorrhea Negative    HPV NOT DETECTED    Trichomonas Negative    Material Submitted CervicoVaginal Pap [ThinPrep Imaged]    CYTOLOGY - PAP PAP RESULT       Assessment & Plan:   Problem List Items Addressed This Visit    Bilateral lower extremity edema -  Primary    Chronic worsening bilateral chronic LE edema, non pitting, situational worse prolonged standing. Most  likely multifactorial with several provoking factors including morbid obesity, diet, hydration, lifestyle. Suspicion of component of venous insufficiency. No asymmetry or other provoking factor to suggest DVT, low risk Well's score. - UTD ECHOcardiogram 02/2018, ruled out cardiac cause  Plan: 1. Order Furosemide 20mg  daily PRN - can adjust dose to 40 daily PRN vs 20 BID if need short term, determine appropriate dose as advised based on UOP / weight - Add K-dur potassium 62mEq daily with lasix - Defer HCTZ at this time, she never started, her BP is actually in normal range, will focus on lasix instead of thiazide 2. Referral to Vascular Specialist [facility] - f/u further diagnostic work-up 3. Check labs BMET Cr K in 3-4 weeks - may still use RICE therapy, elevation compression  Follow-up 4 weeks      Relevant Medications   potassium chloride (K-DUR) 10 MEQ tablet   furosemide (LASIX) 20 MG tablet   Other Relevant Orders   Ambulatory referral to Vascular Surgery   BASIC METABOLIC PANEL WITH GFR   Morbid obesity with BMI of 45.0-49.9, adult ([place])    Thought to be linked to fluid retention as well Abnormal weight gain still, morbid obese BMI 123456 Complications with hypertension, pre-diabetes, chronic knee pain Unsucessful [facility] program  Encourage keep improving diet - emphasis, since some limited with knees  Proceed with consultation from [facility] - referral placed back in 01/2019 - request status update on this referral.      Relevant Orders   BASIC METABOLIC PANEL WITH GFR   Pre-diabetes   Relevant Orders   Hemoglobin A1c    Add A1c for preDM trend, last 01/2019    Meds ordered this encounter  Medications  . potassium chloride (K-DUR) 10 MEQ tablet    Sig: Take 1 tablet (10 mEq total) by mouth daily.    Dispense:  30 tablet    Refill:  1  . furosemide (LASIX) 20 MG tablet    Sig: Take 1 tablet (20 mg total) by mouth daily as needed for fluid or edema. Can  adjust dose as advised to 40mg  or 20mg  twice a day if need    Dispense:  30 tablet    Refill:  2    Follow up plan: Return in about 4 weeks (around 08/09/2019) for edema, med .  Future labs ordered for 3-4 weeks   Nobie Putnam, DO [facility] 07/12/2019, 4:03 PM

## 2019-07-12 NOTE — Assessment & Plan Note (Signed)
Chronic worsening bilateral chronic LE edema, non pitting, situational worse prolonged standing. Most likely multifactorial with several provoking factors including morbid obesity, diet, hydration, lifestyle. Suspicion of component of venous insufficiency. No asymmetry or other provoking factor to suggest DVT, low risk Well's score. - UTD ECHOcardiogram 02/2018, ruled out cardiac cause  Plan: 1. Order Furosemide 20mg  daily PRN - can adjust dose to 40 daily PRN vs 20 BID if need short term, determine appropriate dose as advised based on UOP / weight - Add K-dur potassium 36mEq daily with lasix - Defer HCTZ at this time, she never started, her BP is actually in normal range, will focus on lasix instead of thiazide 2. Referral to Vascular Specialist [facility] - f/u further diagnostic work-up 3. Check labs BMET Cr K in 3-4 weeks - may still use RICE therapy, elevation compression  Follow-up 4 weeks

## 2019-07-12 NOTE — Patient Instructions (Addendum)
Thank you for coming to the office today.  Start Furosemide (Lasix) 20mg  daily for now as needed - can start with 1 in morning every day for next several days, if not strong enough can double for 2 pills if not lasting long enough can take 1 in AM and 1 in afternoon. Let me know which dose you prefer  Take K potassium with it each day.  We will check on status of [facility] - stay tuned, but feel free to call as well.  Referral to Vascular for swelling  .[facility] [place], [place] [address]  Main: [phone]   DUE for NON FASTING BLOOD WORK  SCHEDULE "Lab Only" visit in the morning at the clinic for lab draw in [place]   - Make sure Lab Only appointment is at about 1 week before your next appointment, so that results will be available  For Lab Results, once available within 2-3 days of blood draw, you can can log in to MyChart online to view your results and a brief explanation. Also, we can discuss results at next follow-up visit.  Please schedule a Follow-up Appointment to: Return in about 4 weeks (around 08/09/2019) for edema, med .  If you have any other questions or concerns, please feel free to call the office or send a message through [place]. You may also schedule an earlier appointment if necessary.  Additionally, you may be receiving a survey about your experience at our office within a few days to 1 week by e-mail or mail. We value your feedback.  Nobie Putnam, DO [place]

## 2019-07-12 NOTE — Assessment & Plan Note (Signed)
Thought to be linked to fluid retention as well Abnormal weight gain still, morbid obese BMI 123456 Complications with hypertension, pre-diabetes, chronic knee pain Unsucessful [facility] program  Encourage keep improving diet - emphasis, since some limited with knees  Proceed with consultation from [facility] - referral placed back in 01/2019 - request status update on this referral.

## 2019-07-25 DIAGNOSIS — M25561 Pain in right knee: Secondary | ICD-10-CM | POA: Diagnosis not present

## 2019-08-10 DIAGNOSIS — M25561 Pain in right knee: Secondary | ICD-10-CM | POA: Diagnosis not present

## 2019-08-11 DIAGNOSIS — M170 Bilateral primary osteoarthritis of knee: Secondary | ICD-10-CM | POA: Diagnosis not present

## 2019-08-15 ENCOUNTER — Ambulatory Visit (INDEPENDENT_AMBULATORY_CARE_PROVIDER_SITE_OTHER): Payer: 59 | Admitting: Vascular Surgery

## 2019-08-15 ENCOUNTER — Encounter (INDEPENDENT_AMBULATORY_CARE_PROVIDER_SITE_OTHER): Payer: Self-pay | Admitting: Vascular Surgery

## 2019-08-15 ENCOUNTER — Other Ambulatory Visit: Payer: Self-pay

## 2019-08-15 VITALS — BP 158/111 | HR 93 | Resp 16 | Ht 60.0 in | Wt 276.0 lb

## 2019-08-15 DIAGNOSIS — R0600 Dyspnea, unspecified: Secondary | ICD-10-CM

## 2019-08-15 DIAGNOSIS — R7303 Prediabetes: Secondary | ICD-10-CM

## 2019-08-15 DIAGNOSIS — R0609 Other forms of dyspnea: Secondary | ICD-10-CM

## 2019-08-15 DIAGNOSIS — I10 Essential (primary) hypertension: Secondary | ICD-10-CM

## 2019-08-15 DIAGNOSIS — Z20828 Contact with and (suspected) exposure to other viral communicable diseases: Secondary | ICD-10-CM | POA: Diagnosis not present

## 2019-08-15 DIAGNOSIS — I890 Lymphedema, not elsewhere classified: Secondary | ICD-10-CM | POA: Diagnosis not present

## 2019-08-15 NOTE — Progress Notes (Signed)
MRN : [id number]  Stacey Taylor is a 38 y.o. ([birth year]) female who presents with chief complaint of No chief complaint on file. Marland Kitchen  History of Present Illness:  Patient is seen for evaluation of leg pain and leg swelling. The patient first noticed the swelling remotely even in her teens. The swelling is associated with pain and discoloration. The pain and swelling worsens with prolonged dependency and improves with elevation. The pain is unrelated to activity.  The patient notes that in the morning the legs are significantly improved but they steadily worsened throughout the course of the day. The patient also notes a steady worsening of the discoloration in the ankle and shin area.   The patient denies claudication symptoms.  The patient denies symptoms consistent with rest pain.  The patient denies and extensive history of DJD and LS spine disease.  The patient has no had any past angiography, interventions or vascular surgery.  Elevation makes the leg symptoms better, dependency makes them much worse. There is no history of ulcerations. The patient denies any recent changes in medications.  The patient has been wearing graduated compression occasionally.  The patient denies a history of DVT or PE. There is no prior history of phlebitis. There is no history of primary lymphedema.  No history of malignancies. No history of trauma or groin or pelvic surgery. There is no history of radiation treatment to the groin or pelvis  The patient denies amaurosis fugax or recent TIA symptoms. There are no recent neurological changes noted. The patient denies recent episodes of angina or shortness of breath  No outpatient medications have been marked as taking for the 08/15/19 encounter (Appointment) with Delana Meyer, Dolores Lory, MD.    Past Medical History:  Diagnosis Date  . Abnormal Pap smear of anus    HGSIL  . Anemia   . Back pain   . Cervical dysplasia    CIN III  . Endometriosis   .  Gallbladder problem   . Headache   . Heart murmur   . Leg edema   . Premature delivery    x2    Past Surgical History:  Procedure Laterality Date  . CERVICAL BIOPSY  W/ LOOP ELECTRODE EXCISION  11/2005  . CESAREAN SECTION  2008/2015  . CESAREAN SECTION WITH BILATERAL TUBAL LIGATION  2015  . CHOLECYSTECTOMY  2003  . COLPOSCOPY  2005, 2006  . COMBINED HYSTEROSCOPY DIAGNOSTIC / D&C  2014  . CORONARY ARTERY BYPASS GRAFT      Social History Social History   Tobacco Use  . Smoking status: Never Smoker  . Smokeless tobacco: Never Used  Substance Use Topics  . Alcohol use: No    Alcohol/week: 0.0 standard drinks  . Drug use: No    Family History Family History  Adopted: Yes  Family history unknown: Yes  No family history of bleeding/clotting disorders, porphyria or autoimmune disease   Allergies  Allergen Reactions  . Latex Hives     REVIEW OF SYSTEMS (Negative unless checked)  Constitutional: [] Weight loss  [] Fever  [] Chills Cardiac: [] Chest pain   [] Chest pressure   [] Palpitations   [] Shortness of breath when laying flat   [] Shortness of breath with exertion. Vascular:  [] Pain in legs with walking   [x] Pain in legs at rest  [] History of DVT   [] Phlebitis   [x] Swelling in legs   [] Varicose veins   [] Non-healing ulcers Pulmonary:   [] Uses home oxygen   [] Productive cough   [] Hemoptysis   []   Wheeze  [] COPD   [] Asthma Neurologic:  [] Dizziness   [] Seizures   [] History of stroke   [] History of TIA  [] Aphasia   [] Vissual changes   [] Weakness or numbness in arm   [] Weakness or numbness in leg Musculoskeletal:   [] Joint swelling   [] Joint pain   [] Low back pain Hematologic:  [] Easy bruising  [] Easy bleeding   [] Hypercoagulable state   [] Anemic Gastrointestinal:  [] Diarrhea   [] Vomiting  [] Gastroesophageal reflux/heartburn   [] Difficulty swallowing. Genitourinary:  [] Chronic kidney disease   [] Difficult urination  [] Frequent urination   [] Blood in urine Skin:  [] Rashes    [] Ulcers  Psychological:  [] History of anxiety   []  History of major depression.  Physical Examination  There were no vitals filed for this visit. There is no height or weight on file to calculate BMI. Gen: WD/WN, NAD; over weight Head: Bishop/AT, No temporalis wasting.  Ear/Nose/Throat: Hearing grossly intact, nares w/o erythema or drainage, poor dentition Eyes: PER, EOMI, sclera nonicteric.  Neck: Supple, no masses.  No bruit or JVD.  Pulmonary:  Good air movement, clear to auscultation bilaterally, no use of accessory muscles.  Cardiac: RRR, normal S1, S2, no Murmurs. Vascular: scattered varicosities present bilaterally.  Mild venous stasis changes to the legs bilaterally.  3+ soft pitting edema Vessel Right Left  PT Palpable Palpable  DP Palpable Palpable  Gastrointestinal: soft, non-distended. No guarding/no peritoneal signs.  Musculoskeletal: M/S 5/5 throughout.  No deformity or atrophy.  Neurologic: CN 2-12 intact. Pain and light touch intact in extremities.  Symmetrical.  Speech is fluent. Motor exam as listed above. Psychiatric: Judgment intact, Mood & affect appropriate for pt's clinical situation. Dermatologic: No rashes or ulcers noted.  No changes consistent with cellulitis. Lymph : No Cervical lymphadenopathy, no lichenification or skin changes of chronic lymphedema.  CBC Lab Results  Component Value Date   WBC 7.4 07/08/2018   HGB 12.4 07/08/2018   HCT 37.2 07/08/2018   MCV 84.5 07/08/2018   PLT 422 (H) 07/08/2018    BMET    Component Value Date/Time   NA 140 07/08/2018 0827   NA 140 12/10/2017 1117   NA 139 01/14/2013 0150   K 3.7 07/08/2018 0827   K 3.6 01/14/2013 0150   CL 104 07/08/2018 0827   CL 107 01/14/2013 0150   CO2 28 07/08/2018 0827   CO2 23 01/14/2013 0150   GLUCOSE 91 07/08/2018 0827   GLUCOSE 105 (H) 01/14/2013 0150   BUN 8 07/08/2018 0827   BUN 8 12/10/2017 1117   BUN 10 01/14/2013 0150   CREATININE 0.67 07/08/2018 0827   CALCIUM 9.1  07/08/2018 0827   CALCIUM 8.6 01/14/2013 0150   GFRNONAA 113 07/08/2018 0827   GFRAA 131 07/08/2018 0827   CrCl cannot be calculated (Patient's most recent lab result is older than the maximum 21 days allowed.).  COAG No results found for: INR, PROTIME  Radiology No results found.   Assessment/Plan 1. Lymphedema I have had a long discussion with the patient regarding swelling and why it  causes symptoms.  Patient will begin wearing graduated compression stockings class 1 (20-30 mmHg) on a daily basis a prescription was given. The patient will  beginning wearing the stockings first thing in the morning and removing them in the evening. The patient is instructed specifically not to sleep in the stockings.   In addition, behavioral modification will be initiated.  This will include frequent elevation, use of over the counter pain medications and exercise such as  walking.  I have reviewed systemic causes for chronic edema such as liver, kidney and cardiac etiologies.  The patient denies problems with these organ systems.    Consideration for a lymph pump will also be made based upon the effectiveness of conservative therapy.  This would help to improve the edema control and prevent sequela such as ulcers and infections   Patient should undergo duplex ultrasound of the venous system to ensure that DVT or reflux is not present.  The patient will follow-up with me after the ultrasound.    2. Essential hypertension Continue antihypertensive medications as already ordered, these medications have been reviewed and there are no changes at this time.   3. Pre-diabetes Continue hypoglycemic medications as needed, these medications have been reviewed and there are no changes at this time.  Hgb A1C to be monitored as already arranged by primary service   4. Dyspnea on exertion Continue pulmonary medications and aerosols as already ordered, these medications have been reviewed and there are  no changes at this time.      Hortencia Pilar, MD  08/15/2019 8:07 AM

## 2019-08-24 DIAGNOSIS — M25561 Pain in right knee: Secondary | ICD-10-CM | POA: Diagnosis not present

## 2019-08-25 ENCOUNTER — Other Ambulatory Visit: Payer: Self-pay

## 2019-08-25 DIAGNOSIS — Z20828 Contact with and (suspected) exposure to other viral communicable diseases: Secondary | ICD-10-CM | POA: Diagnosis not present

## 2019-08-25 DIAGNOSIS — Z20822 Contact with and (suspected) exposure to covid-19: Secondary | ICD-10-CM

## 2019-08-27 LAB — NOVEL CORONAVIRUS, NAA: SARS-CoV-2, NAA: NOT DETECTED

## 2019-09-05 DIAGNOSIS — G8929 Other chronic pain: Secondary | ICD-10-CM | POA: Diagnosis not present

## 2019-09-05 DIAGNOSIS — M25562 Pain in left knee: Secondary | ICD-10-CM | POA: Diagnosis not present

## 2019-09-05 DIAGNOSIS — M25561 Pain in right knee: Secondary | ICD-10-CM | POA: Diagnosis not present

## 2019-09-09 ENCOUNTER — Ambulatory Visit (INDEPENDENT_AMBULATORY_CARE_PROVIDER_SITE_OTHER): Payer: 59

## 2019-09-09 ENCOUNTER — Other Ambulatory Visit: Payer: Self-pay

## 2019-09-09 DIAGNOSIS — Z23 Encounter for immunization: Secondary | ICD-10-CM | POA: Diagnosis not present

## 2019-09-10 DIAGNOSIS — M25561 Pain in right knee: Secondary | ICD-10-CM | POA: Diagnosis not present

## 2019-09-13 NOTE — Progress Notes (Signed)
Nurse visit flu shot

## 2019-09-19 DIAGNOSIS — M25562 Pain in left knee: Secondary | ICD-10-CM | POA: Diagnosis not present

## 2019-09-19 DIAGNOSIS — G8929 Other chronic pain: Secondary | ICD-10-CM | POA: Diagnosis not present

## 2019-09-19 DIAGNOSIS — M25561 Pain in right knee: Secondary | ICD-10-CM | POA: Diagnosis not present

## 2019-09-24 DIAGNOSIS — M25561 Pain in right knee: Secondary | ICD-10-CM | POA: Diagnosis not present

## 2019-10-17 ENCOUNTER — Ambulatory Visit (INDEPENDENT_AMBULATORY_CARE_PROVIDER_SITE_OTHER): Payer: 59 | Admitting: Vascular Surgery

## 2019-10-17 ENCOUNTER — Encounter (INDEPENDENT_AMBULATORY_CARE_PROVIDER_SITE_OTHER): Payer: 59

## 2019-10-24 DIAGNOSIS — M25561 Pain in right knee: Secondary | ICD-10-CM | POA: Diagnosis not present

## 2019-10-25 DIAGNOSIS — M25561 Pain in right knee: Secondary | ICD-10-CM | POA: Diagnosis not present

## 2019-10-26 ENCOUNTER — Ambulatory Visit (INDEPENDENT_AMBULATORY_CARE_PROVIDER_SITE_OTHER): Payer: 59

## 2019-10-26 ENCOUNTER — Ambulatory Visit (INDEPENDENT_AMBULATORY_CARE_PROVIDER_SITE_OTHER): Payer: 59 | Admitting: Obstetrics and Gynecology

## 2019-10-26 ENCOUNTER — Encounter: Payer: Self-pay | Admitting: Obstetrics and Gynecology

## 2019-10-26 ENCOUNTER — Other Ambulatory Visit: Payer: Self-pay

## 2019-10-26 VITALS — BP 126/100 | Ht 60.0 in | Wt 271.0 lb

## 2019-10-26 DIAGNOSIS — N939 Abnormal uterine and vaginal bleeding, unspecified: Secondary | ICD-10-CM

## 2019-10-26 LAB — POCT HEMOGLOBIN: Hemoglobin: 13.3 g/dL (ref 11–14.6)

## 2019-10-26 MED ORDER — MEDROXYPROGESTERONE ACETATE 10 MG PO TABS: 10.0000 mg | ORAL_TABLET | Freq: Every day | ORAL | 0 refills | Status: DC

## 2019-10-26 NOTE — Progress Notes (Signed)
Olin Hauser, DO   Chief Complaint  Patient presents with  . Advice Only    has been having a lot of vaginal bleeding, passed huge clot, a lot of pelvic pain the last 3 weeks    HPI:      Ms. ALYANA KEMPTER is a 38 y.o. [id number] who LMP was No LMP recorded. (Menstrual status: Oral contraceptives)., presents today for AUB since 10/20. Pt currently on aygestin 5 mg daily for endometriosis (hx of HTN). Usually amenorrheic and without pelvic pain. Missed a few pills 10/20 and started with daily BTB that never resolved. Has worsened in past 3-4 wks with heavier flow, changing overnight super pads TID to QID, 1/2 dollar sized clots, and severe labor like cramping/pelvic pain L>R. Taking tylenol/NSAIDs without relief. She denies urin, vag, GI sx. No LBP, fevers/chills. She is not sex active.  Hx of anemia in past and has been borderline most recently. Hx of ovar cysts and endometriosis in past.   Last annual 1/21 with Dr. Georgianne Fick. Neg pap/neg HPV DNA 1/20; hx of cx dysplasia and LEEP in past. Pt concerned about cx cancer.   Patient Active Problem List   Diagnosis Date Noted  . Lymphedema 08/15/2019  . Bilateral primary osteoarthritis of knee 06/17/2019  . Dizziness 09/16/2018  . Headache disorder 09/16/2018  . Chronic migraine 07/15/2018  . Onychomycosis 03/11/2018  . Vitamin B12 deficiency 01/11/2018  . Pre-diabetes 01/11/2018  . Other fatigue 12/10/2017  . Shortness of breath on exertion 12/10/2017  . Edema 12/10/2017  . Vitamin D deficiency 12/10/2017  . Bilateral lower extremity edema 07/09/2017  . Dyspnea on exertion 07/09/2017  . Essential hypertension 07/09/2017  . Anemia 07/09/2017  . Chronic low back pain with left-sided sciatica 09/09/2016  . Morbid obesity with BMI of 45.0-49.9, adult ([place]) 11/23/2015    Past Surgical History:  Procedure Laterality Date  . CERVICAL BIOPSY  W/ LOOP ELECTRODE EXCISION  11/2005  . CESAREAN SECTION  2008/2015  . CESAREAN  SECTION WITH BILATERAL TUBAL LIGATION  2015  . CHOLECYSTECTOMY  2003  . COLPOSCOPY  2005, 2006  . COMBINED HYSTEROSCOPY DIAGNOSTIC / D&C  2014  . CORONARY ARTERY BYPASS GRAFT      Family History  Adopted: Yes  Family history unknown: Yes    Social History   Socioeconomic History  . Marital status: Married    Spouse name: Christia Reading  . Number of children: 5  . Years of education: Not on file  . Highest education level: Not on file  Occupational History  . Occupation: CNA  Tobacco Use  . Smoking status: Never Smoker  . Smokeless tobacco: Never Used  Substance and Sexual Activity  . Alcohol use: No    Alcohol/week: 0.0 standard drinks  . Drug use: No  . Sexual activity: Not Currently    Birth control/protection: Pill, Surgical    Comment: Tubal ligation  Other Topics Concern  . Not on file  Social History Narrative  . Not on file   Social Determinants of Health   Financial Resource Strain:   . Difficulty of Paying Living Expenses: Not on file  Food Insecurity:   . Worried About Charity fundraiser in the Last Year: Not on file  . Ran Out of Food in the Last Year: Not on file  Transportation Needs:   . Lack of Transportation (Medical): Not on file  . Lack of Transportation (Non-Medical): Not on file  Physical Activity:   . Days of  Exercise per Week: Not on file  . Minutes of Exercise per Session: Not on file  Stress:   . Feeling of Stress : Not on file  Social Connections:   . Frequency of Communication with Friends and Family: Not on file  . Frequency of Social Gatherings with Friends and Family: Not on file  . Attends Religious Services: Not on file  . Active Member of Clubs or Organizations: Not on file  . Attends Archivist Meetings: Not on file  . Marital Status: Not on file  Intimate Partner Violence:   . Fear of Current or Ex-Partner: Not on file  . Emotionally Abused: Not on file  . Physically Abused: Not on file  . Sexually Abused: Not on  file    Outpatient Medications Prior to Visit  Medication Sig Dispense Refill  . cyclobenzaprine (FLEXERIL) 5 MG tablet Take 1 tablet (5 mg total) by mouth at bedtime as needed for muscle spasms (migraine). 30 tablet 2  . norethindrone (AYGESTIN) 5 MG tablet Take 1 tablet (5 mg total) by mouth daily. 90 tablet 3  . celecoxib (CELEBREX) 200 MG capsule TAKE 1 CAP DAILY X14 DAYS TO THEN ON AN AS NEEDED BASIS FOR PAIN. DISCONTINUE ALL OTHER NSAID MEDS.    . furosemide (LASIX) 20 MG tablet Take 1 tablet (20 mg total) by mouth daily as needed for fluid or edema. Can adjust dose as advised to 40mg  or 20mg  twice a day if need 30 tablet 2  . potassium chloride (K-DUR) 10 MEQ tablet Take 1 tablet (10 mEq total) by mouth daily. 30 tablet 1   No facility-administered medications prior to visit.      ROS:  Review of Systems  Constitutional: Negative for fatigue, fever and unexpected weight change.  Respiratory: Negative for cough, shortness of breath and wheezing.   Cardiovascular: Negative for chest pain, palpitations and leg swelling.  Gastrointestinal: Negative for blood in stool, constipation, diarrhea, nausea and vomiting.  Endocrine: Negative for cold intolerance, heat intolerance and polyuria.  Genitourinary: Positive for menstrual problem and pelvic pain. Negative for dyspareunia, dysuria, flank pain, frequency, genital sores, hematuria, urgency, vaginal bleeding, vaginal discharge and vaginal pain.  Musculoskeletal: Negative for back pain, joint swelling and myalgias.  Skin: Negative for rash.  Neurological: Positive for dizziness and headaches. Negative for syncope, light-headedness and numbness.  Hematological: Negative for adenopathy.  Psychiatric/Behavioral: Negative for agitation, confusion, sleep disturbance and suicidal ideas. The patient is not nervous/anxious.   BREAST: No symptoms   OBJECTIVE:   Vitals:  BP (!) 126/100   Ht 5' (1.524 m)   Wt 271 lb (122.9 kg)   BMI 52.93  kg/m   Physical Exam Vitals reviewed.  Constitutional:      Appearance: She is well-developed.  Pulmonary:     Effort: Pulmonary effort is normal.  Abdominal:     Palpations: Abdomen is soft.     Tenderness: There is abdominal tenderness in the right lower quadrant, suprapubic area and left lower quadrant. There is no guarding or rebound.  Genitourinary:    General: Normal vulva.     Pubic Area: No rash.      Labia:        Right: No rash, tenderness or lesion.        Left: No rash, tenderness or lesion.      Vagina: Bleeding present. No vaginal discharge, erythema or tenderness.     Cervix: Normal.     Uterus: Normal. Tender. Not enlarged.  Adnexa:        Right: Tenderness present. No mass.         Left: Tenderness present. No mass.    Musculoskeletal:        General: Normal range of motion.     Cervical back: Normal range of motion.  Skin:    General: Skin is warm and dry.  Neurological:     General: No focal deficit present.     Mental Status: She is alert and oriented to person, place, and time.  Psychiatric:        Mood and Affect: Mood normal.        Behavior: Behavior normal.        Thought Content: Thought content normal.        Judgment: Judgment normal.     Results:  Results for orders placed or performed in visit on 10/26/19 (from the past 24 hour(s))  POCT hemoglobin     Status: Normal   Collection Time: 10/26/19  2:03 PM  Result Value Ref Range   Hemoglobin 13.3 11 - 14.6 g/dL     ULTRASOUND REPORT  Location: [facility]  Date of Service: 10/26/2019    Indications:Abnormal Uterine Bleeding Findings:  The uterus is axial and measures 10.1 x 5.3 x 5.0 cm. Echo texture is homogenous with evidence of focal masses. Within the cervix there appears to be a fibroid vs. A complex nabothian cyst. I measures 19.5 x 12.6 x 16.4 mm. No blood flow is seen within. The Endometrium is ill-defined and measures between 16.23mm and 23.8 mm.  Right  Ovary is not visualized.  Left Ovary measures 2.5 x 1.2 x 1.3 cm. It is normal in appearance. Survey of the adnexa demonstrates no adnexal masses. There is no free fluid in the cul de sac.  Impression: 1. The endometrial lining is most likely thick.  2. There is a questionable fibroid in the cervical wall.  3. Normal left ovary. The right ovary is not visualized.    Recommendations: 1.Clinical correlation with the patient's History and Physical Exam.   Gweneth Dimitri, RT  Assessment/Plan: Abnormal uterine bleeding (AUB) - Plan: TSH + free T4, Prolactin, POCT hemoglobin, US PELVIC COMPLETE WITH TRANSVAGINAL, medroxyPROGESTERone (PROVERA) 10 MG tablet, CANCELED: US Transvaginal Non-OB;   Sx since 10/20, getting worse. Normal HgB daily. On aygestin 5 mg daily. GYN ultrasound with thickened EM. Discussed with RPH. Try provera 10 mg daily for 10 days with aygestin 5 mg daily to see if bleeding stops. Pt to f/u with Dr. Georgianne Fick next wk for further eval/mgmt. F/u sooner with MD on call prn.   Pelvic pain--most likely due to bleeding. Can take NSAIDs.   Meds ordered this encounter  Medications  . medroxyPROGESTERone (PROVERA) 10 MG tablet    Sig: Take 1 tablet (10 mg total) by mouth daily for 10 days.    Dispense:  10 tablet    Refill:  0    Order Specific Question:   Supervising Provider    Answer:   Gae Dry [id number]      Return in about 1 week (around 11/02/2019) for wtih Dr. Georgianne Fick for AUB.  Devina Bezold B. Emilina Smarr, PA-C 10/26/2019 2:06 PM

## 2019-10-26 NOTE — Patient Instructions (Signed)
I value your feedback and entrusting us with your care. If you get a [facility] patient survey, I would appreciate you taking the time to let us know about your experience today. Thank you!  As of October 13, 2019, your lab results will be released to your MyChart immediately, before I even have a chance to see them. Please give me time to review them and contact you if there are any abnormalities. Thank you for your patience.  

## 2019-10-27 LAB — TSH+FREE T4
Free T4: 1.48 ng/dL (ref 0.82–1.77)
TSH: 2.84 u[IU]/mL (ref 0.450–4.500)

## 2019-10-27 LAB — PROLACTIN: Prolactin: 20.1 ng/mL (ref 4.8–23.3)

## 2019-11-01 ENCOUNTER — Other Ambulatory Visit: Payer: Self-pay

## 2019-11-01 ENCOUNTER — Ambulatory Visit (INDEPENDENT_AMBULATORY_CARE_PROVIDER_SITE_OTHER): Payer: 59 | Admitting: Obstetrics and Gynecology

## 2019-11-01 ENCOUNTER — Other Ambulatory Visit (HOSPITAL_COMMUNITY)
Admission: RE | Admit: 2019-11-01 | Discharge: 2019-11-01 | Disposition: A | Discharge status: Home / Self Care | Payer: 59 | Source: Ambulatory Visit | Attending: Obstetrics and Gynecology | Admitting: Obstetrics and Gynecology

## 2019-11-01 ENCOUNTER — Encounter: Payer: Self-pay | Admitting: Obstetrics and Gynecology

## 2019-11-01 VITALS — BP 130/96 | HR 114 | Ht 60.0 in | Wt 273.0 lb

## 2019-11-01 DIAGNOSIS — N939 Abnormal uterine and vaginal bleeding, unspecified: Secondary | ICD-10-CM | POA: Diagnosis not present

## 2019-11-01 DIAGNOSIS — R9389 Abnormal findings on diagnostic imaging of other specified body structures: Secondary | ICD-10-CM | POA: Insufficient documentation

## 2019-11-01 NOTE — Progress Notes (Signed)
Obstetrics & Gynecology Office Visit   Chief Complaint:  Chief Complaint  Patient presents with  . DUB    irregular cycles x few months, painful cramping, history of Endometriosis    History of Present Illness: Patient was being treated for presumptive endometriosis with po norethindrone.  Over the past few months developed AUB.  Work up thus far includes TVUS showing thickened endometrial lining, she presents today to discuss follow up management options for her bleeding   Review of Systems: review of systems negative unless noted in HPI  Past Medical History:  Past Medical History:  Diagnosis Date  . Abnormal Pap smear of anus    HGSIL  . Anemia   . Back pain   . Cervical dysplasia    CIN III  . Endometriosis   . Gallbladder problem   . Headache   . Heart murmur   . Leg edema   . Premature delivery    x2    Past Surgical History:  Past Surgical History:  Procedure Laterality Date  . CERVICAL BIOPSY  W/ LOOP ELECTRODE EXCISION  11/2005  . CESAREAN SECTION  2008/2015  . CESAREAN SECTION WITH BILATERAL TUBAL LIGATION  2015  . CHOLECYSTECTOMY  2003  . COLPOSCOPY  2005, 2006  . COMBINED HYSTEROSCOPY DIAGNOSTIC / D&C  2014  . CORONARY ARTERY BYPASS GRAFT      Gynecologic History: No LMP recorded. (Menstrual status: Oral contraceptives).  Obstetric History: [id number]  Family History:  Family History  Adopted: Yes  Family history unknown: Yes    Social History:  Social History   Socioeconomic History  . Marital status: Married    Spouse name: Christia Reading  . Number of children: 5  . Years of education: Not on file  . Highest education level: Not on file  Occupational History  . Occupation: CNA  Tobacco Use  . Smoking status: Never Smoker  . Smokeless tobacco: Never Used  Substance and Sexual Activity  . Alcohol use: No    Alcohol/week: 0.0 standard drinks  . Drug use: No  . Sexual activity: Not Currently    Birth control/protection: Pill, Surgical      Comment: Tubal ligation  Other Topics Concern  . Not on file  Social History Narrative  . Not on file   Social Determinants of Health   Financial Resource Strain:   . Difficulty of Paying Living Expenses: Not on file  Food Insecurity:   . Worried About Charity fundraiser in the Last Year: Not on file  . Ran Out of Food in the Last Year: Not on file  Transportation Needs:   . Lack of Transportation (Medical): Not on file  . Lack of Transportation (Non-Medical): Not on file  Physical Activity:   . Days of Exercise per Week: Not on file  . Minutes of Exercise per Session: Not on file  Stress:   . Feeling of Stress : Not on file  Social Connections:   . Frequency of Communication with Friends and Family: Not on file  . Frequency of Social Gatherings with Friends and Family: Not on file  . Attends Religious Services: Not on file  . Active Member of Clubs or Organizations: Not on file  . Attends Archivist Meetings: Not on file  . Marital Status: Not on file  Intimate Partner Violence:   . Fear of Current or Ex-Partner: Not on file  . Emotionally Abused: Not on file  . Physically Abused: Not on file  .  Sexually Abused: Not on file    Allergies:  Allergies  Allergen Reactions  . Latex Hives    Medications: Prior to Admission medications   Medication Sig Start Date End Date Taking? Authorizing Provider  celecoxib (CELEBREX) 200 MG capsule TAKE 1 CAP DAILY X14 DAYS TO THEN ON AN AS NEEDED BASIS FOR PAIN. DISCONTINUE ALL OTHER NSAID MEDS. 06/13/19  Yes [provider]  cyclobenzaprine (FLEXERIL) 5 MG tablet Take 1 tablet (5 mg total) by mouth at bedtime as needed for muscle spasms (migraine). 01/13/19  Yes Karamalegos, Devonne Doughty, DO  medroxyPROGESTERone (PROVERA) 10 MG tablet Take 1 tablet (10 mg total) by mouth daily for 10 days. 10/26/19 [id number] Yes Copland, Deirdre Evener, PA-C  norethindrone (AYGESTIN) 5 MG tablet Take 1 tablet (5 mg total) by mouth daily.  11/18/18  Yes Malachy Mood, MD    Physical Exam Vitals:  Vitals:   11/01/19 0919  BP: (!) 130/96  Pulse: (!) 114   No LMP recorded. (Menstrual status: Oral contraceptives).  General: NAD, well nourished, appears stated age 42: normocephalic, anicteric Pulmonary: No increased work of breathing Genitourinary:  External: Normal external female genitalia.  Normal urethral meatus, normal  Bartholin's and Skene's glands.    Vagina: Normal vaginal mucosa, no evidence of prolapse.    Cervix: Grossly normal in appearance, no bleeding  Uterus: Non-enlarged, mobile, normal contour.  No CMT  Adnexa: ovaries non-enlarged, no adnexal masses  Rectal: deferred  Lymphatic: no evidence of inguinal lymphadenopathy Extremities: no edema, erythema, or tenderness Neurologic: Grossly intact Psychiatric: mood appropriate, affect full  Female chaperone present for pelvic  portions of the physical exam  ENDOMETRIAL BIOPSY     The indications for endometrial biopsy were reviewed.   Risks of the biopsy including cramping, bleeding, infection, uterine perforation, inadequate specimen and need for additional procedures  were discussed. The patient states she understands and agrees to undergo procedure today. Consent was signed. Time out was performed. Urine HCG was negative. A Graves speculum was placed and the cervix was brought into view.  The cervix was prepped with Betadine. A single-toothed tenaculum was  placed on the anterior lip of the cervix for traction. A 3 mm pipelle was introduced through the cervix into the endometrial cavity without difficulty to a depth of 7cm, and a small amount of tissue was obtained in two passes, the resulting specime sent to pathology. The instruments were removed from the patient's vagina. Minimal bleeding from the cervix was noted. The patient tolerated the procedure well. Routine post-procedure instructions were given to the patient.  She will be contacted by phone  one results become available.     Assessment: 38 y.o. [id number] abnormal uterine bleeding  Plan: Problem List Items Addressed This Visit    None    Visit Diagnoses    Abnormal uterine bleeding    -  Primary   Relevant Orders   Surgical pathology   Thickened endometrium       Relevant Orders   Surgical pathology     1) AUB - thickened endometrial lining on ultrasound last week.  Bleeding has subsided on provera.  Endometrial biopsy obtained today.  Discussed management options including po progestin, Mirena IUD, endometrial ablation, and hysterectomy.  Management option will also hinge on results of endometrial biopsy.  We discussed possible etiologies for appearance of thickened endometrium including anovulatory cycles, polyps, hyperplasia, or carcinoma.    2_ A total of 15 minutes were spent in face-to-face contact with the patient during this  encounter with over half of that time devoted to counseling and coordination of care.  3) No follow-ups on file.     Malachy Mood, MD, [facility], [place]

## 2019-11-02 LAB — SURGICAL PATHOLOGY

## 2019-11-09 DIAGNOSIS — M25561 Pain in right knee: Secondary | ICD-10-CM | POA: Diagnosis not present

## 2019-11-10 ENCOUNTER — Ambulatory Visit (INDEPENDENT_AMBULATORY_CARE_PROVIDER_SITE_OTHER): Payer: 59 | Admitting: Vascular Surgery

## 2019-11-10 ENCOUNTER — Encounter (INDEPENDENT_AMBULATORY_CARE_PROVIDER_SITE_OTHER): Payer: 59

## 2019-11-10 DIAGNOSIS — M25561 Pain in right knee: Secondary | ICD-10-CM | POA: Diagnosis not present

## 2019-11-23 ENCOUNTER — Other Ambulatory Visit: Payer: Self-pay | Admitting: Obstetrics and Gynecology

## 2019-11-23 NOTE — Telephone Encounter (Signed)
advise

## 2019-11-24 DIAGNOSIS — M25561 Pain in right knee: Secondary | ICD-10-CM | POA: Diagnosis not present

## 2019-11-25 DIAGNOSIS — M25561 Pain in right knee: Secondary | ICD-10-CM | POA: Diagnosis not present

## 2019-12-01 ENCOUNTER — Telehealth: Payer: Self-pay | Admitting: Obstetrics and Gynecology

## 2019-12-01 NOTE — Telephone Encounter (Signed)
Lmtrc

## 2019-12-01 NOTE — Telephone Encounter (Signed)
-----   Message from Malachy Mood, MD sent at 11/15/2019  3:17 PM EST ----- Regarding: Surgery Surgery Booking Request One OR are available agin Patient Full Name:  Stacey Taylor  MRN: [id number]  DOB: 07-Mar-1981  Surgeon: Malachy Mood, MD  Requested Surgery Date and Time: Once OR are open Primary Diagnosis AND Code: AUB Secondary Diagnosis and Code:  Surgical Procedure: TLH, BS, cystoscopy L&D Notification: No Admission Status: surgery admit Length of Surgery: 2hrs Special Case Needs: No H&P: Yes Phone Interview???:  No Interpreter: No Language:  Medical Clearance:  No Special Scheduling Instructions: none Any known health/anesthesia issues, diabetes, sleep apnea, latex allergy, defibrillator/pacemaker?: No Acuity: P3   (P1 highest, P2 delay may cause harm, P3 low, elective gyn, P4 lowest)

## 2019-12-02 ENCOUNTER — Encounter (INDEPENDENT_AMBULATORY_CARE_PROVIDER_SITE_OTHER): Payer: Self-pay | Admitting: Vascular Surgery

## 2019-12-02 NOTE — Telephone Encounter (Signed)
Patient is aware of H&P on 3/16 @ 9:10am w/ Dr. Georgianne Fick, Pre-admit testing to be scheduled, COVID testing on 3/23, and OR on 01/26/20. Patient is aware to quarantine after COVID testing. Patient is aware she may receive calls from the [facility] and [facility]. Patient confirmed UMR and no secondary insurance.

## 2019-12-10 DIAGNOSIS — M25561 Pain in right knee: Secondary | ICD-10-CM | POA: Diagnosis not present

## 2019-12-11 DIAGNOSIS — M25561 Pain in right knee: Secondary | ICD-10-CM | POA: Diagnosis not present

## 2019-12-25 DIAGNOSIS — M25561 Pain in right knee: Secondary | ICD-10-CM | POA: Diagnosis not present

## 2019-12-26 DIAGNOSIS — M25561 Pain in right knee: Secondary | ICD-10-CM | POA: Diagnosis not present

## 2019-12-28 ENCOUNTER — Telehealth: Payer: Self-pay | Admitting: Obstetrics and Gynecology

## 2019-12-28 NOTE — Telephone Encounter (Signed)
L/M for pt to rtn call Wanting to resch surgery

## 2019-12-29 NOTE — Telephone Encounter (Signed)
rtn call from pt wanting to reschedule surgery for end of April  Will pull dates and call pt back

## 2019-12-30 NOTE — Telephone Encounter (Signed)
L/M for pt to rtn call Have available dates for rescheduling surgery

## 2020-01-07 DIAGNOSIS — M25561 Pain in right knee: Secondary | ICD-10-CM | POA: Diagnosis not present

## 2020-01-08 DIAGNOSIS — M25561 Pain in right knee: Secondary | ICD-10-CM | POA: Diagnosis not present

## 2020-01-09 NOTE — Telephone Encounter (Signed)
Called pt regarding surgery date change. She had conflict with daughters scheduled surgery.  Changed DOS to 5/6 H&P 4/23 @ 8:50am  Adv that Covid testing will now be 5/4  Will fax Pre-admit for change to be made on phone and Covid appts

## 2020-01-17 ENCOUNTER — Encounter: Payer: 59 | Admitting: Obstetrics and Gynecology

## 2020-01-22 DIAGNOSIS — M25561 Pain in right knee: Secondary | ICD-10-CM | POA: Diagnosis not present

## 2020-01-23 ENCOUNTER — Other Ambulatory Visit: Payer: 59

## 2020-01-23 DIAGNOSIS — M25561 Pain in right knee: Secondary | ICD-10-CM | POA: Diagnosis not present

## 2020-01-24 ENCOUNTER — Other Ambulatory Visit: Payer: 59

## 2020-02-07 DIAGNOSIS — M25561 Pain in right knee: Secondary | ICD-10-CM | POA: Diagnosis not present

## 2020-02-08 DIAGNOSIS — M25561 Pain in right knee: Secondary | ICD-10-CM | POA: Diagnosis not present

## 2020-02-22 DIAGNOSIS — M25561 Pain in right knee: Secondary | ICD-10-CM | POA: Diagnosis not present

## 2020-02-23 DIAGNOSIS — M25561 Pain in right knee: Secondary | ICD-10-CM | POA: Diagnosis not present

## 2020-02-24 ENCOUNTER — Ambulatory Visit (INDEPENDENT_AMBULATORY_CARE_PROVIDER_SITE_OTHER): Payer: 59 | Admitting: Obstetrics and Gynecology

## 2020-02-24 ENCOUNTER — Other Ambulatory Visit: Payer: Self-pay

## 2020-02-24 ENCOUNTER — Encounter: Payer: Self-pay | Admitting: Obstetrics and Gynecology

## 2020-02-24 VITALS — BP 136/102 | HR 101 | Ht 60.0 in | Wt 283.0 lb

## 2020-02-24 DIAGNOSIS — N939 Abnormal uterine and vaginal bleeding, unspecified: Secondary | ICD-10-CM

## 2020-02-24 DIAGNOSIS — Z01818 Encounter for other preprocedural examination: Secondary | ICD-10-CM

## 2020-02-24 NOTE — Progress Notes (Signed)
Obstetrics & Gynecology Surgery H&P    Chief Complaint: Scheduled Surgery   History of Present Illness: Patient is a 39 y.o. [id number] presenting for scheduled TLH, BS, cystoscopy, for the treatment or further evaluation of abnormal uterine bleeding.   Prior Treatments prior to proceeding with surgery include: hormonal management with po progestin given history of CHTN not candidate for estrogen therapy  Preoperative Pap: 11/18/2018 NIL HPV negative Preoperative Endometrial biopsy: 11/01/2019 Decidualized endometrium negative for hyperplasia or malignancy.   Preoperative Ultrasound: 10/26/2019 Thickened endometrial complex measuring up to 23.40mm, possible cervical fibroid  19.5 x 12.6 x 16.69mm  Most recent Hgb 13.3 on 10/26/2019.  The patient surgical history is notable for 2 prior cesarean sections with tubal ligation performed at the time of her last cesarean.  She has a remote history of LEEP which was complicated by cervical stenosis and hematometra.   Review of Systems:Review of Systems  Constitutional: Negative.   Respiratory: Negative for cough.   Cardiovascular: Positive for leg swelling. Negative for chest pain, palpitations, orthopnea, claudication and PND.  Gastrointestinal: Negative.   Genitourinary: Negative.      Past Medical History:  Patient Active Problem List   Diagnosis Date Noted  . Lymphedema 08/15/2019  . Bilateral primary osteoarthritis of knee 06/17/2019  . Dizziness 09/16/2018  . Headache disorder 09/16/2018  . Chronic migraine 07/15/2018  . Onychomycosis 03/11/2018  . Vitamin B12 deficiency 01/11/2018  . Pre-diabetes 01/11/2018  . Other fatigue 12/10/2017  . Shortness of breath on exertion 12/10/2017  . Edema 12/10/2017  . Vitamin D deficiency 12/10/2017  . Bilateral lower extremity edema 07/09/2017  . Dyspnea on exertion 07/09/2017  . Essential hypertension 07/09/2017  . Anemia 07/09/2017  . Chronic low back pain with left-sided sciatica  09/09/2016  . Morbid obesity with BMI of 45.0-49.9, adult ([place]) 11/23/2015    Past Surgical History:  Past Surgical History:  Procedure Laterality Date  . CERVICAL BIOPSY  W/ LOOP ELECTRODE EXCISION  11/2005  . CESAREAN SECTION  2008/2015  . CESAREAN SECTION WITH BILATERAL TUBAL LIGATION  2015  . CHOLECYSTECTOMY  2003  . COLPOSCOPY  2005, 2006  . COMBINED HYSTEROSCOPY DIAGNOSTIC / D&C  2014  . CORONARY ARTERY BYPASS GRAFT      Family History:  Family History  Adopted: Yes  Family history unknown: Yes    Social History:  Social History   Socioeconomic History  . Marital status: Married    Spouse name: Christia Reading  . Number of children: 5  . Years of education: Not on file  . Highest education level: Not on file  Occupational History  . Occupation: CNA  Tobacco Use  . Smoking status: Never Smoker  . Smokeless tobacco: Never Used  Substance and Sexual Activity  . Alcohol use: No    Alcohol/week: 0.0 standard drinks  . Drug use: No  . Sexual activity: Not Currently    Birth control/protection: Pill, Surgical    Comment: Tubal ligation  Other Topics Concern  . Not on file  Social History Narrative  . Not on file   Social Determinants of Health   Financial Resource Strain:   . Difficulty of Paying Living Expenses:   Food Insecurity:   . Worried About Charity fundraiser in the Last Year:   . Arboriculturist in the Last Year:   Transportation Needs:   . Film/video editor (Medical):   Marland Kitchen Lack of Transportation (Non-Medical):   Physical Activity:   . Days of Exercise  per Week:   . Minutes of Exercise per Session:   Stress:   . Feeling of Stress :   Social Connections:   . Frequency of Communication with Friends and Family:   . Frequency of Social Gatherings with Friends and Family:   . Attends Religious Services:   . Active Member of Clubs or Organizations:   . Attends Archivist Meetings:   Marland Kitchen Marital Status:   Intimate Partner Violence:   .  Fear of Current or Ex-Partner:   . Emotionally Abused:   Marland Kitchen Physically Abused:   . Sexually Abused:     Allergies:  Allergies  Allergen Reactions  . Latex Hives    Medications: Prior to Admission medications   Medication Sig Start Date End Date Taking? Authorizing Provider  loratadine (CLARITIN) 10 MG tablet Take 10 mg by mouth daily.   Yes [provider]  norethindrone (AYGESTIN) 5 MG tablet TAKE 1 TABLET (5 MG TOTAL) BY MOUTH DAILY. 11/23/19  Yes Malachy Mood, MD    Physical Exam Vitals: Blood pressure (!) 136/102, pulse (!) 101, height 5' (1.524 m), weight 283 lb (128.4 kg). Body mass index is 55.27 kg/m.  General: NAD HEENT: normocephalic, anicteric Pulmonary: No increased work of breathing, CTAB Cardiovascular: RRR, distal pulses 2+ Abdomen: soft, non-tender, non-distended Genitourinary: deferred Extremities: no edema, erythema, or tenderness Neurologic: Grossly intact Psychiatric: mood appropriate, affect full  Imaging No results found.  Assessment: 39 y.o. [id number] presenting for scheduled TLH, BS, cystoscopy  Plan: 1) Patient opts for definitive surgical management via hysterectomy. The risks of surgery were discussed in detail with the patient including but not limited to: bleeding which may require transfusion or reoperation; infection which may require antibiotics; injury to bowel, bladder, ureters or other surrounding organs (With a literature reported rate of urinary tract injury of 1% quoted); need for additional procedures including laparotomy; thromboembolic phenomenon, incisional problems and other postoperative/anesthesia complications.  Patient was also advised that recovery procedure generally involves an overnight stay; and the  expected recovery time after a hysterectomy being in the range of 6-8 weeks.  Likelihood of success in alleviating the patient's symptoms was discussed.  While definitive in regards to issues with menstural bleeding,  pelvic pain if present preoperatively may continue and in fact worsen postoperatively.  She is aware that the procedure will render her unable to pursue childbearing in the future.   She was told that she will be contacted by our surgical scheduler regarding the time and date of her surgery; routine preoperative instructions of having nothing to eat or drink after midnight on the day prior to surgery and also coming to the hospital 1.5 hours prior to her time of surgery were also emphasized.  She was told she may be called for a preoperative appointment about a week prior to surgery and will be given further preoperative instructions at that visit.  Routine postoperative instructions will be reviewed with the patient and her family in detail after surgery. Printed patient education handouts about the procedure was given to the patient to review at home.   2) Routine postoperative instructions were reviewed with the patient and her family in detail today including the expected length of recovery and likely postoperative course.  The patient concurred with the proposed plan, giving informed written consent for the surgery today.  Patient instructed on the importance of being NPO after midnight prior to her procedure.  If warranted preoperative prophylactic antibiotics and SCDs ordered on call to the OR  to meet SCIP guidelines and adhere to recommendation laid forth in [place] Number 104 May 2009  "Antibiotic Prophylaxis for Gynecologic Procedures".     Malachy Mood, MD, [facility], [facility] 02/24/2020, 9:33 AM

## 2020-02-27 ENCOUNTER — Other Ambulatory Visit: Payer: Self-pay

## 2020-02-27 ENCOUNTER — Encounter
Admission: RE | Admit: 2020-02-27 | Discharge: 2020-02-27 | Disposition: A | Discharge status: Home / Self Care | Payer: 59 | Source: Ambulatory Visit | Attending: Obstetrics and Gynecology | Admitting: Obstetrics and Gynecology

## 2020-02-27 DIAGNOSIS — Z01812 Encounter for preprocedural laboratory examination: Secondary | ICD-10-CM | POA: Insufficient documentation

## 2020-02-27 NOTE — Patient Instructions (Signed)
Your procedure is scheduled on: Thurs 5/6 Report to Day Surgery. To find out your arrival time please call [phone] between 1PM - 3PM on Wed. 5/5.  Remember: Instructions that are not followed completely may result in serious medical risk,  up to and including death, or upon the discretion of your surgeon and anesthesiologist your  surgery may need to be rescheduled.     _X__ 1. Do not eat food after midnight the night before your procedure.                 No gum chewing or hard candies. You may drink clear liquids up to 2 hours                 before you are scheduled to arrive for your surgery- DO not drink clear                 liquids within 2 hours of the start of your surgery.                 Clear Liquids include:  water, apple juice without pulp, clear Gatorade, G2 or                  Gatorade Zero (avoid Red/Purple/Blue), Black Coffee or Tea (Do not add                 anything to coffee or tea). __x___2.   Complete the carbohydrate drink provided to you, 2 hours before arrival.  __X__2.  On the morning of surgery brush your teeth with toothpaste and water, you                may rinse your mouth with mouthwash if you wish.  Do not swallow any toothpaste of mouthwash.     ___ 3.  No Alcohol for 24 hours before or after surgery.   ___ 4.  Do Not Smoke or use e-cigarettes For 24 Hours Prior to Your Surgery.                 Do not use any chewable tobacco products for at least 6 hours prior to                 Surgery.  _X__  5.  Do not use any recreational drugs (marijuana, cocaine, heroin, ecstacy, MDMA or other)                For at least one week prior to your surgery.  Combination of these drugs with anesthesia                May have life threatening results.  ____  6.  Bring all medications with you on the day of surgery if instructed.   __x__  7.  Notify your doctor if there is any change in your medical condition       (cold, fever, infections).     Do not wear jewelry, make-up, hairpins, clips or nail polish. Do not wear lotions, powders, or perfumes. You may wear deodorant. Do not shave 48 hours prior to surgery. Men may shave face and neck. Do not bring valuables to the hospital.    [facility] is not responsible for any belongings or valuables.  Contacts, dentures or bridgework may not be worn into surgery. Leave your suitcase in the car.  After surgery it may be brought to your room. For patients admitted to the hospital, discharge time is determined by your treatment team.   Patients discharged the day of surgery will not be allowed to drive home.   Make arrangements for someone to be with you for the first 24 hours of your Same Day Discharge.    Please read over the following fact sheets that you were given:    _x___ Take these medicines the morning of surgery with A SIP OF WATER:    1. loratadine (CLARITIN) 10 MG tablet  2.   3.   4.  5.  6.  ____ Fleet Enema (as directed)   _x___ Use CHG Soap (or wipes) as directed  ____ Use Benzoyl Peroxide Gel as instructed  ____ Use inhalers on the day of surgery  ____ Stop metformin 2 days prior to surgery    ____ Take 1/2 of usual insulin dose the night before surgery. No insulin the morning          of surgery.   ____ Stop Coumadin/Plavix/aspirin on   ___x_ Stop Anti-inflammatories No ibuprofen aleve or aspirin after 4/29   ____ Stop supplements until after surgery.    ____ Bring C-Pap to the hospital.

## 2020-03-06 ENCOUNTER — Other Ambulatory Visit
Admission: RE | Admit: 2020-03-06 | Discharge: 2020-03-06 | Disposition: A | Discharge status: Home / Self Care | Payer: 59 | Source: Ambulatory Visit | Attending: Obstetrics and Gynecology | Admitting: Obstetrics and Gynecology

## 2020-03-06 ENCOUNTER — Other Ambulatory Visit: Payer: Self-pay

## 2020-03-06 DIAGNOSIS — Z01812 Encounter for preprocedural laboratory examination: Secondary | ICD-10-CM | POA: Diagnosis not present

## 2020-03-06 DIAGNOSIS — Z20822 Contact with and (suspected) exposure to covid-19: Secondary | ICD-10-CM | POA: Diagnosis not present

## 2020-03-06 LAB — TYPE AND SCREEN
ABO/RH(D): A POS
Antibody Screen: NEGATIVE

## 2020-03-06 LAB — CBC
HCT: 39.5 % (ref 36.0–46.0)
Hemoglobin: 12.4 g/dL (ref 12.0–15.0)
MCH: 26.7 pg (ref 26.0–34.0)
MCHC: 31.4 g/dL (ref 30.0–36.0)
MCV: 84.9 fL (ref 80.0–100.0)
Platelets: 460 10*3/uL — ABNORMAL HIGH (ref 150–400)
RBC: 4.65 MIL/uL (ref 3.87–5.11)
RDW: 14.6 % (ref 11.5–15.5)
WBC: 8.9 10*3/uL (ref 4.0–10.5)
nRBC: 0 % (ref 0.0–0.2)

## 2020-03-06 LAB — BASIC METABOLIC PANEL
Anion gap: 8 (ref 5–15)
BUN: 7 mg/dL (ref 6–20)
CO2: 28 mmol/L (ref 22–32)
Calcium: 8.6 mg/dL — ABNORMAL LOW (ref 8.9–10.3)
Chloride: 104 mmol/L (ref 98–111)
Creatinine, Ser: 0.51 mg/dL (ref 0.44–1.00)
GFR calc Af Amer: >60 mL/min (ref >60)
GFR calc non Af Amer: >60 mL/min (ref >60)
Glucose, Bld: 108 mg/dL — ABNORMAL HIGH (ref 70–99)
Potassium: 3.5 mmol/L (ref 3.5–5.1)
Sodium: 140 mmol/L (ref 135–145)

## 2020-03-06 LAB — SARS CORONAVIRUS 2 (TAT 6-24 HRS): SARS Coronavirus 2: NEGATIVE

## 2020-03-08 ENCOUNTER — Observation Stay: Payer: 59 | Admitting: Anesthesiology

## 2020-03-08 ENCOUNTER — Encounter
Admission: RE | Disposition: A | Discharge status: Home / Self Care | Payer: Self-pay | Source: Home / Self Care | Attending: Obstetrics and Gynecology

## 2020-03-08 ENCOUNTER — Encounter: Payer: Self-pay | Admitting: Obstetrics and Gynecology

## 2020-03-08 ENCOUNTER — Observation Stay
Admission: RE | Admit: 2020-03-08 | Discharge: 2020-03-09 | Disposition: A | Discharge status: Home / Self Care | Payer: 59 | Attending: Obstetrics and Gynecology | Admitting: Obstetrics and Gynecology

## 2020-03-08 ENCOUNTER — Other Ambulatory Visit: Payer: Self-pay

## 2020-03-08 DIAGNOSIS — M1990 Unspecified osteoarthritis, unspecified site: Secondary | ICD-10-CM | POA: Diagnosis not present

## 2020-03-08 DIAGNOSIS — N920 Excessive and frequent menstruation with regular cycle: Secondary | ICD-10-CM | POA: Insufficient documentation

## 2020-03-08 DIAGNOSIS — N939 Abnormal uterine and vaginal bleeding, unspecified: Secondary | ICD-10-CM

## 2020-03-08 DIAGNOSIS — Z6841 Body Mass Index (BMI) 40.0 and over, adult: Secondary | ICD-10-CM | POA: Diagnosis not present

## 2020-03-08 DIAGNOSIS — Z90710 Acquired absence of both cervix and uterus: Secondary | ICD-10-CM | POA: Diagnosis present

## 2020-03-08 DIAGNOSIS — I10 Essential (primary) hypertension: Secondary | ICD-10-CM | POA: Insufficient documentation

## 2020-03-08 DIAGNOSIS — D259 Leiomyoma of uterus, unspecified: Principal | ICD-10-CM | POA: Insufficient documentation

## 2020-03-08 DIAGNOSIS — M25561 Pain in right knee: Secondary | ICD-10-CM | POA: Diagnosis not present

## 2020-03-08 DIAGNOSIS — K660 Peritoneal adhesions (postprocedural) (postinfection): Secondary | ICD-10-CM | POA: Insufficient documentation

## 2020-03-08 DIAGNOSIS — Z90711 Acquired absence of uterus with remaining cervical stump: Secondary | ICD-10-CM | POA: Diagnosis not present

## 2020-03-08 DIAGNOSIS — D649 Anemia, unspecified: Secondary | ICD-10-CM | POA: Diagnosis not present

## 2020-03-08 HISTORY — PX: CYSTOSCOPY: SHX5120

## 2020-03-08 HISTORY — PX: LAPAROSCOPIC SUPRACERVICAL HYSTERECTOMY: SHX5399

## 2020-03-08 LAB — ABO/RH: ABO/RH(D): A POS

## 2020-03-08 LAB — POCT PREGNANCY, URINE: Preg Test, Ur: NEGATIVE

## 2020-03-08 SURGERY — HYSTERECTOMY, SUPRACERVICAL, LAPAROSCOPIC
Anesthesia: General

## 2020-03-08 MED ORDER — FAMOTIDINE 20 MG PO TABS: 20.0000 mg | ORAL_TABLET | Freq: Once | ORAL | Status: AC

## 2020-03-08 MED ORDER — FENTANYL CITRATE (PF) 100 MCG/2ML IJ SOLN
INTRAMUSCULAR | Status: DC | PRN
  Administered 2020-03-08: 50 ug via INTRAVENOUS

## 2020-03-08 MED ORDER — OXYCODONE-ACETAMINOPHEN 5-325 MG PO TABS: 1.0000 | ORAL_TABLET | ORAL | Status: DC | PRN

## 2020-03-08 MED ORDER — FENTANYL CITRATE (PF) 100 MCG/2ML IJ SOLN
INTRAMUSCULAR | Status: AC
  Filled 2020-03-08: qty 2

## 2020-03-08 MED ORDER — MIDAZOLAM HCL 2 MG/2ML IJ SOLN
INTRAMUSCULAR | Status: DC | PRN
  Administered 2020-03-08: 2 mg via INTRAVENOUS

## 2020-03-08 MED ORDER — LIDOCAINE HCL (CARDIAC) PF 100 MG/5ML IV SOSY
PREFILLED_SYRINGE | INTRAVENOUS | Status: DC | PRN
  Administered 2020-03-08: 90 mg via INTRAVENOUS

## 2020-03-08 MED ORDER — PROPOFOL 10 MG/ML IV BOLUS
INTRAVENOUS | Status: DC | PRN
  Administered 2020-03-08: 150 mg via INTRAVENOUS

## 2020-03-08 MED ORDER — MENTHOL 3 MG MT LOZG
1.0000 | LOZENGE | OROMUCOSAL | Status: DC | PRN
  Filled 2020-03-08: qty 9

## 2020-03-08 MED ORDER — LACTATED RINGERS IV SOLN: INTRAVENOUS | Status: DC

## 2020-03-08 MED ORDER — PROPOFOL 10 MG/ML IV BOLUS
INTRAVENOUS | Status: AC
  Filled 2020-03-08: qty 20

## 2020-03-08 MED ORDER — BUPIVACAINE HCL 0.5 % IJ SOLN
INTRAMUSCULAR | Status: DC | PRN
  Administered 2020-03-08: 16 mL

## 2020-03-08 MED ORDER — ACETAMINOPHEN 325 MG PO TABS: 325.0000 mg | ORAL_TABLET | ORAL | Status: DC | PRN

## 2020-03-08 MED ORDER — ACETAMINOPHEN 160 MG/5ML PO SOLN
325.0000 mg | ORAL | Status: DC | PRN
  Filled 2020-03-08: qty 20.3

## 2020-03-08 MED ORDER — PHENYLEPHRINE HCL (PRESSORS) 10 MG/ML IV SOLN
INTRAVENOUS | Status: DC | PRN
  Administered 2020-03-08: 100 ug via INTRAVENOUS
  Administered 2020-03-08 (×4): 200 ug via INTRAVENOUS
  Administered 2020-03-08: 100 ug via INTRAVENOUS

## 2020-03-08 MED ORDER — MEPERIDINE HCL 50 MG/ML IJ SOLN: 6.2500 mg | INTRAMUSCULAR | Status: DC | PRN

## 2020-03-08 MED ORDER — BUPIVACAINE HCL (PF) 0.5 % IJ SOLN
INTRAMUSCULAR | Status: AC
  Filled 2020-03-08: qty 30

## 2020-03-08 MED ORDER — VASOPRESSIN 20 UNIT/ML IV SOLN
INTRAVENOUS | Status: DC | PRN
Start: 2020-03-08 — End: 2020-03-08
  Administered 2020-03-08 (×4): 2 [IU] via INTRAVENOUS
  Administered 2020-03-08: 1 [IU] via INTRAVENOUS

## 2020-03-08 MED ORDER — FAMOTIDINE 20 MG PO TABS
ORAL_TABLET | ORAL | Status: AC
  Administered 2020-03-08: 20 mg via ORAL
  Filled 2020-03-08: qty 1

## 2020-03-08 MED ORDER — ACETAMINOPHEN NICU IV SYRINGE 10 MG/ML
INTRAVENOUS | Status: AC
  Filled 2020-03-08: qty 1

## 2020-03-08 MED ORDER — SUCCINYLCHOLINE CHLORIDE 20 MG/ML IJ SOLN
INTRAMUSCULAR | Status: DC | PRN
  Administered 2020-03-08: 120 mg via INTRAVENOUS

## 2020-03-08 MED ORDER — SODIUM CHLORIDE 0.9 % IV SOLN
INTRAVENOUS | Status: DC | PRN
  Administered 2020-03-08: 30 ug/min via INTRAVENOUS

## 2020-03-08 MED ORDER — DEXTROSE 5 % IV SOLN
3.0000 g | INTRAVENOUS | Status: AC
  Administered 2020-03-08: 3 g via INTRAVENOUS
  Filled 2020-03-08: qty 3

## 2020-03-08 MED ORDER — KETOROLAC TROMETHAMINE 30 MG/ML IJ SOLN
30.0000 mg | Freq: Once | INTRAMUSCULAR | Status: AC | PRN
  Administered 2020-03-08: 30 mg via INTRAVENOUS

## 2020-03-08 MED ORDER — MIDAZOLAM HCL 2 MG/2ML IJ SOLN
INTRAMUSCULAR | Status: AC
  Filled 2020-03-08: qty 2

## 2020-03-08 MED ORDER — ROCURONIUM BROMIDE 100 MG/10ML IV SOLN
INTRAVENOUS | Status: DC | PRN
  Administered 2020-03-08 (×2): 10 mg via INTRAVENOUS
  Administered 2020-03-08: 40 mg via INTRAVENOUS
  Administered 2020-03-08: 20 mg via INTRAVENOUS

## 2020-03-08 MED ORDER — ONDANSETRON HCL 4 MG/2ML IJ SOLN
INTRAMUSCULAR | Status: DC | PRN
  Administered 2020-03-08: 4 mg via INTRAVENOUS

## 2020-03-08 MED ORDER — KETOROLAC TROMETHAMINE 30 MG/ML IJ SOLN
INTRAMUSCULAR | Status: AC
  Filled 2020-03-08: qty 1

## 2020-03-08 MED ORDER — MORPHINE SULFATE (PF) 2 MG/ML IV SOLN: 1.0000 mg | INTRAVENOUS | Status: DC | PRN

## 2020-03-08 MED ORDER — SEVOFLURANE IN SOLN
RESPIRATORY_TRACT | Status: AC
  Filled 2020-03-08: qty 250

## 2020-03-08 MED ORDER — SIMETHICONE 80 MG PO CHEW
80.0000 mg | CHEWABLE_TABLET | Freq: Four times a day (QID) | ORAL | Status: DC | PRN
  Administered 2020-03-08 – 2020-03-09 (×2): 80 mg via ORAL
  Filled 2020-03-08 (×2): qty 1

## 2020-03-08 MED ORDER — LIDOCAINE HCL (PF) 2 % IJ SOLN
INTRAMUSCULAR | Status: AC
  Filled 2020-03-08: qty 5

## 2020-03-08 MED ORDER — OXYCODONE-ACETAMINOPHEN 5-325 MG PO TABS
2.0000 | ORAL_TABLET | ORAL | Status: DC | PRN
  Administered 2020-03-08 – 2020-03-09 (×4): 2 via ORAL
  Filled 2020-03-08 (×4): qty 2

## 2020-03-08 MED ORDER — ONDANSETRON HCL 4 MG PO TABS: 4.0000 mg | ORAL_TABLET | Freq: Four times a day (QID) | ORAL | Status: DC | PRN

## 2020-03-08 MED ORDER — FENTANYL CITRATE (PF) 100 MCG/2ML IJ SOLN
25.0000 ug | INTRAMUSCULAR | Status: DC | PRN
  Administered 2020-03-08: 50 ug via INTRAVENOUS

## 2020-03-08 MED ORDER — KETOROLAC TROMETHAMINE 30 MG/ML IJ SOLN
30.0000 mg | Freq: Four times a day (QID) | INTRAMUSCULAR | Status: AC
  Administered 2020-03-08 – 2020-03-09 (×3): 30 mg via INTRAVENOUS
  Filled 2020-03-08 (×3): qty 1

## 2020-03-08 MED ORDER — SUGAMMADEX SODIUM 200 MG/2ML IV SOLN
INTRAVENOUS | Status: DC | PRN
  Administered 2020-03-08: 200 mg via INTRAVENOUS

## 2020-03-08 MED ORDER — PROMETHAZINE HCL 25 MG/ML IJ SOLN: 6.2500 mg | INTRAMUSCULAR | Status: DC | PRN

## 2020-03-08 MED ORDER — IBUPROFEN 800 MG PO TABS: 800.0000 mg | ORAL_TABLET | Freq: Four times a day (QID) | ORAL | Status: DC

## 2020-03-08 MED ORDER — DEXTROSE-NACL 5-0.45 % IV SOLN: INTRAVENOUS | Status: DC

## 2020-03-08 MED ORDER — ONDANSETRON HCL 4 MG/2ML IJ SOLN
4.0000 mg | Freq: Four times a day (QID) | INTRAMUSCULAR | Status: DC | PRN
  Filled 2020-03-08: qty 2

## 2020-03-08 SURGICAL SUPPLY — 58 items
ANCHOR TIS RET SYS 1550ML (BAG) ×2 IMPLANT
APPLICATOR ARISTA FLEXITIP XL (MISCELLANEOUS) ×2 IMPLANT
BAG URINE DRAIN 2000ML AR STRL (UROLOGICAL SUPPLIES) ×2 IMPLANT
BLADE SURG SZ11 CARB STEEL (BLADE) ×4 IMPLANT
CANISTER SUCT 1200ML W/VALVE (MISCELLANEOUS) ×2 IMPLANT
CATH FOLEY 2WAY  5CC 16FR (CATHETERS) ×1
CATH URTH 16FR FL 2W BLN LF (CATHETERS) ×1 IMPLANT
CHLORAPREP W/TINT 26 (MISCELLANEOUS) ×2 IMPLANT
COVER WAND RF STERILE (DRAPES) ×2 IMPLANT
DEFOGGER SCOPE WARMER CLEARIFY (MISCELLANEOUS) ×2 IMPLANT
DERMABOND ADVANCED (GAUZE/BANDAGES/DRESSINGS) ×1
DERMABOND ADVANCED .7 DNX12 (GAUZE/BANDAGES/DRESSINGS) ×1 IMPLANT
DEVICE SUTURE ENDOST 10MM (ENDOMECHANICALS) ×2 IMPLANT
GAUZE 4X4 16PLY RFD (DISPOSABLE) ×2 IMPLANT
GLOVE BIO SURGEON STRL SZ7 (GLOVE) ×8 IMPLANT
GLOVE INDICATOR 7.5 STRL GRN (GLOVE) ×2 IMPLANT
GOWN STRL REUS W/ TWL LRG LVL3 (GOWN DISPOSABLE) ×2 IMPLANT
GOWN STRL REUS W/ TWL XL LVL3 (GOWN DISPOSABLE) ×1 IMPLANT
GOWN STRL REUS W/TWL LRG LVL3 (GOWN DISPOSABLE) ×2
GOWN STRL REUS W/TWL XL LVL3 (GOWN DISPOSABLE) ×1
GRASPER SUT TROCAR 14GX15 (MISCELLANEOUS) ×2 IMPLANT
HEMOSTAT ARISTA ABSORB 3G PWDR (HEMOSTASIS) ×2 IMPLANT
IRRIGATION STRYKERFLOW (MISCELLANEOUS) ×1 IMPLANT
IRRIGATOR STRYKERFLOW (MISCELLANEOUS) ×2
IV LACTATED RINGERS 1000ML (IV SOLUTION) ×2 IMPLANT
IV NS 1000ML (IV SOLUTION) ×1
IV NS 1000ML BAXH (IV SOLUTION) ×1 IMPLANT
KIT PINK PAD W/HEAD ARE REST (MISCELLANEOUS) ×2
KIT PINK PAD W/HEAD ARM REST (MISCELLANEOUS) ×1 IMPLANT
LABEL OR SOLS (LABEL) ×2 IMPLANT
MANIPULATOR VCARE LG CRV RETR (MISCELLANEOUS) IMPLANT
MANIPULATOR VCARE SML CRV RETR (MISCELLANEOUS) IMPLANT
MANIPULATOR VCARE STD CRV RETR (MISCELLANEOUS) ×2 IMPLANT
NS IRRIG 1000ML POUR BTL (IV SOLUTION) ×2 IMPLANT
NS IRRIG 500ML POUR BTL (IV SOLUTION) ×2 IMPLANT
OCCLUDER COLPOPNEUMO (BALLOONS) ×2 IMPLANT
PACK GYN LAPAROSCOPIC (MISCELLANEOUS) ×2 IMPLANT
PAD OB MATERNITY 4.3X12.25 (PERSONAL CARE ITEMS) ×2 IMPLANT
PAD PREP 24X41 OB/GYN DISP (PERSONAL CARE ITEMS) ×2 IMPLANT
SCISSORS METZENBAUM CVD 33 (INSTRUMENTS) ×2 IMPLANT
SET CYSTO W/LG BORE CLAMP LF (SET/KITS/TRAYS/PACK) ×2 IMPLANT
SHEARS HARMONIC ACE PLUS 36CM (ENDOMECHANICALS) ×2 IMPLANT
SLEEVE ENDOPATH XCEL 5M (ENDOMECHANICALS) ×4 IMPLANT
STRAP SAFETY 5IN WIDE (MISCELLANEOUS) ×2 IMPLANT
SURGILUBE 2OZ TUBE FLIPTOP (MISCELLANEOUS) ×2 IMPLANT
SUT ENDO VLOC 180-0-8IN (SUTURE) ×2 IMPLANT
SUT MNCRL 4-0 (SUTURE) ×1
SUT MNCRL 4-0 27XMFL (SUTURE) ×1
SUT VIC AB 0 CT1 27 (SUTURE) ×1
SUT VIC AB 0 CT1 27XCR 8 STRN (SUTURE) ×1 IMPLANT
SUT VICRYL 0 AB UR-6 (SUTURE) ×2 IMPLANT
SUTURE MNCRL 4-0 27XMF (SUTURE) ×1 IMPLANT
SYR 10ML LL (SYRINGE) ×2 IMPLANT
SYR 50ML LL SCALE MARK (SYRINGE) ×2 IMPLANT
TROCAR BLADELESS 15MM (ENDOMECHANICALS) ×2 IMPLANT
TROCAR ENDO BLADELESS 11MM (ENDOMECHANICALS) ×2 IMPLANT
TROCAR XCEL NON-BLD 5MMX100MML (ENDOMECHANICALS) ×2 IMPLANT
TUBING EVAC SMOKE HEATED PNEUM (TUBING) ×2 IMPLANT

## 2020-03-08 NOTE — Op Note (Addendum)
Preoperative Diagnosis: 1) 39 y.o. with menorrhagia   Postoperative Diagnosis: 1) 39 y.o. with menorrhagia  2) Omental and bladder adhesions  Operation Performed: Laparoscopic supracervical hysterectomy, bilateral salpingectomy, lysis of adhesions, and cystoscopy  Indication: Patient with menorrhagia unresponsive to medical management with failure of IUD, OCP's norethindrone  Surgeon: Malachy Mood, MD  Assistant: Adrian Prows, MD this surgery required a high level surgical assistant with none other readily available  Anesthesia: General  Preoperative Antibiotics: 3g ancef  Estimated Blood Loss: 200 mL  IV Fluids: 1L  Urine Output:: 434mL  Drains or Tubes: Foley to gravity drainage  Implants: none  Specimens Removed: Uterus, and bilateral fallopian tubes  Complications: none  Intraoperative Findings: Thick omental adhesions to anterior abdominal wall.  Dense adhesions of bladder to cervix.  Normal ovaries bilaterally.  Status post prior salpingectomy  Patient Condition: stable  Procedure in Detail:  Patient was taken to the operating room where she was administered general anesthesia.  She was positioned in the dorsal lithotomy position utilizing Allen stirups, prepped and draped in the usual sterile fashion.  Prior to proceeding with procedure a time out was performed.  Attention was turned to the patient's pelvis.  An indwelling foley catheter was placed to decompress the patient's bladder.  An operative speculum was placed to allow visualization of the cervix.  The anterior lip of the cervix was grasped with a single tooth tenaculum, and a medium V-care uterine manipulator was placed to allow manipulation of the uterus.  The operative speculum and single tooth tenaculum were then removed.  Attention was turned to the patient's abdomen.  The umbilicus was infiltrated with 1% Sensorcaine, before making a stab incision using an 11 blade scalpel.  A 4mm Excel trocar was  then used to gain direct entry into the peritoneal cavity utilizing the camera to visualize progress of the trocar during placement.  Once peritoneal entry had been achieved, insufflation was started and pneumoperitoneum established at a pressure of 38mmHg.    One left and one right lower quadrant site were then injected with 1% Sensorcaine and a stab incision was made using an 11 blade scalpel.  Two additional 31mm Excel trocars were placed through these incisions under direct visualization. General inspection of the abdomen revealed the above noted findings, and the umbilical trocar was stepped up to an 82mm trocar.   The omental adhesions were free up to gain an adequate view of the pelvis using a 80mm Harmonic scalpel.  The left tube was identified and grasped at its fimbriated end.  The tube was transected from its attachments to the ovary and mesosalpinx using a 82mm Harmonic scalpel.  The utero ovarian ligament was identified ligated and transected using the Harmonic scalpel. The round ligament was then likewise ligated and transected.  The anterior leaf of the broad ligament was dissected down to the level of the internal cervical os and a bladder flap unable to be started secondary to dense adhesions without a clearly discernable place,  The posterior leaf of the broad ligament was dissected down to the utero-sacral ligament.  The uterine artery was skeletonized before being ligated and transected using the Harmonic scalpel with cephelad pressure applied to the V-care device to assure lateralization of the ureter.  A bite was then taken with Harmonic medial to transected portio of uterine artery to further lateralize the ureter and vessel off the V-care cup.  The patient right adnexal structures were then dissected in similar by Dr. Gilman Schmidt.. The bladder was re-inspected  with no clear plane seen again.  Given that there was problems ventilating the patient in trendelenburg with pneumoperitoneum decision was  made to proceed with supracervical hysterectomy.  The uterus was amputated at the level of the internal cervical os using harmonic scalpel.  The 83mm port site was changed out for a 88mm port, and an endocatch bag was used to remove the uterine specimen. The specimen was morcellated in the bag.    The 15 mm trocar site was closed using a running 0 Vicryl. Followed by a 4-0 monocryl subcuticular skin closure.  Pneumoperitoneum was reestablished, pelvis was irrigated all pedicles were hemostatic. 3g of Arista was applied to all pedicles.  Pneumoperitoneum was evacuated and remaining trocars were removed.  All trocar sites were then dressed with surgical skin glue.    The indwelling foley catheter was removed.  Cystoscopy was performed noting and intact bladder dome as well as brisk efflux of urine from bother ureteral orifices.  The cystoscopy was removed and the indwelling foley catheter was replaced.   Sponge needle and instrument counts were correct time two.  The patient tolerated the procedure well and was taken to the recovery room in stable condition.

## 2020-03-08 NOTE — Anesthesia Preprocedure Evaluation (Addendum)
Anesthesia Evaluation   Patient awake    Reviewed: Allergy & Precautions, H&P , NPO status , reviewed documented beta blocker date and time   Airway Mallampati: II  TM Distance: >3 FB Neck ROM: full    Dental  (+) Caps, Dental Advidsory Given   Pulmonary    Pulmonary exam normal        Cardiovascular hypertension, Normal cardiovascular exam+ Valvular Problems/Murmurs   02/2018 Study Conclusions   - Left ventricle: The cavity size was normal. Systolic function was  normal. The estimated ejection fraction was in the range of 55%  to 60%. Wall motion was normal; there were no regional wall  motion abnormalities. Left ventricular diastolic function  parameters were normal.  - Left atrium: The atrium was normal in size.  - Right ventricle: Systolic function was normal.  - Pulmonary arteries: Systolic pressure was within the normal  range.    Neuro/Psych  Headaches,  Neuromuscular disease    GI/Hepatic neg GERD  ,  Endo/Other  Morbid obesity  Renal/GU      Musculoskeletal  (+) Arthritis ,   Abdominal   Peds  Hematology  (+) Blood dyscrasia, anemia ,   Anesthesia Other Findings Past Medical History: No date: Abnormal Pap smear of anus     Comment:  HGSIL No date: Anemia No date: Back pain No date: Cervical dysplasia     Comment:  CIN III No date: Endometriosis No date: Gallbladder problem No date: Headache No date: Heart murmur No date: Leg edema No date: Premature delivery     Comment:  x2 Past Surgical History: 11/2005: CERVICAL BIOPSY  W/ LOOP ELECTRODE EXCISION 2008/2015: CESAREAN SECTION 2015: CESAREAN SECTION WITH BILATERAL TUBAL LIGATION 2003: CHOLECYSTECTOMY 2005, 2006: COLPOSCOPY 2014: COMBINED HYSTEROSCOPY DIAGNOSTIC / D&C BMI    Body Mass Index: 54.68 kg/m     Reproductive/Obstetrics                          Anesthesia Physical Anesthesia Plan  ASA:  III  Anesthesia Plan: General   Post-op Pain Management:    Induction: Intravenous  PONV Risk Score and Plan: Ondansetron and Treatment may vary due to age or medical condition  Airway Management Planned: Oral ETT  Additional Equipment:   Intra-op Plan:   Post-operative Plan: Extubation in OR  Informed Consent: I have reviewed the patients History and Physical, chart, labs and discussed the procedure including the risks, benefits and alternatives for the proposed anesthesia with the patient or authorized representative who has indicated his/her understanding and acceptance.     Dental Advisory Given  Plan Discussed with: CRNA  Anesthesia Plan Comments:        Anesthesia Quick Evaluation

## 2020-03-08 NOTE — Transfer of Care (Signed)
Immediate Anesthesia Transfer of Care Note  Patient: Stacey Taylor  Procedure(s) Performed: TOTAL LAPAROSCOPIC HYSTERECTOMY WITH BILATERAL SALPINGECTOMY (N/A ) CYSTOSCOPY (N/A )  Patient Location: PACU  Anesthesia Type:General  Level of Consciousness: awake and drowsy  Airway & Oxygen Therapy: Patient Spontanous Breathing and Patient connected to face mask oxygen  Post-op Assessment: Report given to RN and Post -op Vital signs reviewed and stable  Post vital signs: Reviewed and stable  Last Vitals:  Vitals Value Taken Time  BP 123/69 03/08/20 1427  Temp 36.4 C 03/08/20 1427  Pulse 91 03/08/20 1432  Resp 24 03/08/20 1432  SpO2 98 % 03/08/20 1432  Vitals shown include unvalidated device data.  Last Pain:  Vitals:   03/08/20 1427  TempSrc:   PainSc: Asleep         Complications: No apparent anesthesia complications

## 2020-03-08 NOTE — Anesthesia Procedure Notes (Signed)
Procedure Name: Intubation Date/Time: 03/08/2020 11:33 AM Performed by: Allean Found, CRNA Pre-anesthesia Checklist: Patient identified, Patient being monitored, Timeout performed, Emergency Drugs available and Suction available Patient Re-evaluated:Patient Re-evaluated prior to induction Oxygen Delivery Method: Circle system utilized Preoxygenation: Pre-oxygenation with 100% oxygen Induction Type: IV induction Ventilation: Mask ventilation without difficulty Laryngoscope Size: 3 and McGraph Grade View: Grade I Tube type: Oral Tube size: 7.0 mm Number of attempts: 1 Airway Equipment and Method: Stylet Placement Confirmation: ETT inserted through vocal cords under direct vision,  positive ETCO2 and breath sounds checked- equal and bilateral Secured at: 21 cm Tube secured with: Tape Dental Injury: Teeth and Oropharynx as per pre-operative assessment

## 2020-03-08 NOTE — Progress Notes (Signed)
Patient will have periods of apnea. SPO2 will drop to 71% on simple mask 6L, and will go back up to 97%. Will only stay in the 70's for 10 seconds. Patient snoring. This happened after giving Fentanyl 99mcg.

## 2020-03-09 DIAGNOSIS — Z6841 Body Mass Index (BMI) 40.0 and over, adult: Secondary | ICD-10-CM | POA: Diagnosis not present

## 2020-03-09 DIAGNOSIS — K660 Peritoneal adhesions (postprocedural) (postinfection): Secondary | ICD-10-CM | POA: Diagnosis not present

## 2020-03-09 DIAGNOSIS — D259 Leiomyoma of uterus, unspecified: Secondary | ICD-10-CM | POA: Diagnosis not present

## 2020-03-09 DIAGNOSIS — N939 Abnormal uterine and vaginal bleeding, unspecified: Secondary | ICD-10-CM

## 2020-03-09 DIAGNOSIS — N920 Excessive and frequent menstruation with regular cycle: Secondary | ICD-10-CM | POA: Diagnosis not present

## 2020-03-09 DIAGNOSIS — M1990 Unspecified osteoarthritis, unspecified site: Secondary | ICD-10-CM | POA: Diagnosis not present

## 2020-03-09 DIAGNOSIS — M25561 Pain in right knee: Secondary | ICD-10-CM | POA: Diagnosis not present

## 2020-03-09 DIAGNOSIS — I10 Essential (primary) hypertension: Secondary | ICD-10-CM | POA: Diagnosis not present

## 2020-03-09 LAB — CBC
HCT: 35.5 % — ABNORMAL LOW (ref 36.0–46.0)
Hemoglobin: 10.9 g/dL — ABNORMAL LOW (ref 12.0–15.0)
MCH: 26.5 pg (ref 26.0–34.0)
MCHC: 30.7 g/dL (ref 30.0–36.0)
MCV: 86.4 fL (ref 80.0–100.0)
Platelets: 399 10*3/uL (ref 150–400)
RBC: 4.11 MIL/uL (ref 3.87–5.11)
RDW: 14.8 % (ref 11.5–15.5)
WBC: 10.1 10*3/uL (ref 4.0–10.5)
nRBC: 0 % (ref 0.0–0.2)

## 2020-03-09 LAB — BASIC METABOLIC PANEL
Anion gap: 8 (ref 5–15)
BUN: 9 mg/dL (ref 6–20)
CO2: 30 mmol/L (ref 22–32)
Calcium: 8.4 mg/dL — ABNORMAL LOW (ref 8.9–10.3)
Chloride: 100 mmol/L (ref 98–111)
Creatinine, Ser: 0.64 mg/dL (ref 0.44–1.00)
GFR calc Af Amer: >60 mL/min (ref >60)
GFR calc non Af Amer: >60 mL/min (ref >60)
Glucose, Bld: 123 mg/dL — ABNORMAL HIGH (ref 70–99)
Potassium: 3.5 mmol/L (ref 3.5–5.1)
Sodium: 138 mmol/L (ref 135–145)

## 2020-03-09 MED ORDER — IBUPROFEN 600 MG PO TABS: 600.0000 mg | ORAL_TABLET | Freq: Four times a day (QID) | ORAL | 1 refills | Status: DC | PRN

## 2020-03-09 MED ORDER — OXYCODONE-ACETAMINOPHEN 5-325 MG PO TABS: 1.0000 | ORAL_TABLET | ORAL | 0 refills | Status: DC | PRN

## 2020-03-09 NOTE — Progress Notes (Signed)
Patient discharged home. Discharge instructions, prescriptions and follow up appointment given to and reviewed with patient. Patient verbalized understanding.

## 2020-03-09 NOTE — Discharge Summary (Signed)
Physician Discharge Summary  Patient ID: KATHELENE MONTESANO MRN: [id number] DOB/AGE: 39-Apr-1982 39 y.o.  Admit date: 03/08/2020 Discharge date: 03/09/2020  Admission Diagnoses: Abnormal uterine bleeding  Discharge Diagnoses:  Active Problems:   S/P hysterectomy   S/P laparoscopic supracervical hysterectomy   Discharged Condition: good  Hospital Course: Patient admitted for scheduled TLH, BS, cystoscopy.  Patient with difficulty being ventilated in trendelenburg and with pneumoperitoneum.  Significant omental adhesions and bladder adhesions were encountered.  Given difficulty in ventilation and risk of cystotomy procedure changed to supracervical hysterectomy.  Procedure otherwise uneventful.  Postoperatively the patient had some postoperative emesis on POD0.  She otherwise remained hemodynamically stable and afebrile.  Postoperative day 1 labs showed appropriate WBC and H&H, stable BUN/Cr.  At the time of discharge patient was ambulating, tolerating po, afebrile and hemodynamically stable.   Consults: None  Significant Diagnostic Studies:  Results for orders placed or performed during the hospital encounter of 03/08/20 (from the past 24 hour(s))  CBC     Status: Abnormal   Collection Time: 03/09/20  6:26 AM  Result Value Ref Range   WBC 10.1 4.0 - 10.5 K/uL   RBC 4.11 3.87 - 5.11 MIL/uL   Hemoglobin 10.9 (L) 12.0 - 15.0 g/dL   HCT 35.5 (L) 36.0 - 46.0 %   MCV 86.4 80.0 - 100.0 fL   MCH 26.5 26.0 - 34.0 pg   MCHC 30.7 30.0 - 36.0 g/dL   RDW 14.8 11.5 - 15.5 %   Platelets 399 150 - 400 K/uL   nRBC 0.0 0.0 - 0.2 %  Basic metabolic panel     Status: Abnormal   Collection Time: 03/09/20  6:26 AM  Result Value Ref Range   Sodium 138 135 - 145 mmol/L   Potassium 3.5 3.5 - 5.1 mmol/L   Chloride 100 98 - 111 mmol/L   CO2 30 22 - 32 mmol/L   Glucose, Bld 123 (H) 70 - 99 mg/dL   BUN 9 6 - 20 mg/dL   Creatinine, Ser 0.64 0.44 - 1.00 mg/dL   Calcium 8.4 (L) 8.9 - 10.3 mg/dL   GFR calc non  Af Amer >60 >60 mL/min   GFR calc Af Amer >60 >60 mL/min   Anion gap 8 5 - 15     Treatments: Laparoscopic supracervical hysterectomy, bilateral salpingectomy, and cystscopy  Discharge Exam: Blood pressure 132/78, pulse 86, temperature 97.9 F (36.6 C), resp. rate 18, height 5' (1.524 m), weight 127 kg, SpO2 100 %. General appearance: alert, appears stated age and no distress GI: soft, non-tender; bowel sounds normal; no masses,  no organomegaly Extremities: extremities normal, atraumatic, no cyanosis or edema Incision/Wound: D/C/I trocar sites  Disposition: Discharge disposition: 01-Home or Self Care       Discharge Instructions    Call MD for:   Complete by: As directed    Heavy vaginal bleeding greater than 1 pad an hour   Call MD for:  difficulty breathing, headache or visual disturbances   Complete by: As directed    Call MD for:  extreme fatigue   Complete by: As directed    Call MD for:  hives   Complete by: As directed    Call MD for:  persistant dizziness or light-headedness   Complete by: As directed    Call MD for:  persistant nausea and vomiting   Complete by: As directed    Call MD for:  redness, tenderness, or signs of infection (pain, swelling, redness, odor or green/yellow  discharge around incision site)   Complete by: As directed    Call MD for:  severe uncontrolled pain   Complete by: As directed    Call MD for:  temperature >100.4   Complete by: As directed    Diet general   Complete by: As directed    Discharge wound care:   Complete by: As directed    You may apply a light dressing for minor discharge from the incision or to keep waistbands of clothing from rubbing.  You may shower, use soap on your incision.  Avoid baths or soaking the incision in the first 6 weeks following your surgery..   Driving restriction   Complete by: As directed    Avoid driving for at least 2 weeks or while taking prescription narcotics.   Lifting restrictions    Complete by: As directed    Weight restriction of 10lbs for 6 weeks.     Allergies as of 03/09/2020      Reactions   Latex Hives   Tramadol    Chest pain      Medication List    STOP taking these medications   norethindrone 5 MG tablet Commonly known as: AYGESTIN     TAKE these medications   furosemide 10 MG/ML solution Commonly known as: LASIX Take 10 mg by mouth daily.   ibuprofen 600 MG tablet Commonly known as: ADVIL Take 1 tablet (600 mg total) by mouth every 6 (six) hours as needed for mild pain or cramping.   loratadine 10 MG tablet Commonly known as: CLARITIN Take 10 mg by mouth daily.   oxyCODONE-acetaminophen 5-325 MG tablet Commonly known as: PERCOCET/ROXICET Take 1 tablet by mouth every 4 (four) hours as needed for moderate pain.            Discharge Care Instructions  (From admission, onward)         Start     Ordered   03/09/20 0000  Discharge wound care:    Comments: You may apply a light dressing for minor discharge from the incision or to keep waistbands of clothing from rubbing.  You may shower, use soap on your incision.  Avoid baths or soaking the incision in the first 6 weeks following your surgery.Marland Kitchen   03/09/20 0748           Signed: Malachy Mood 03/09/2020, 7:57 AM

## 2020-03-09 NOTE — Anesthesia Postprocedure Evaluation (Signed)
Anesthesia Post Note  Patient: Stacey Taylor  Procedure(s) Performed: LAPAROSCOPIC SUPRACERVICAL HYSTERECTOMY WITH BILATERAL SALPINGECTOMY (N/A ) CYSTOSCOPY (N/A )  Patient location during evaluation: PACU Anesthesia Type: General Level of consciousness: awake and alert Pain management: pain level controlled Vital Signs Assessment: post-procedure vital signs reviewed and stable Respiratory status: spontaneous breathing, nonlabored ventilation, respiratory function stable and patient connected to nasal cannula oxygen Cardiovascular status: blood pressure returned to baseline and stable Postop Assessment: no apparent nausea or vomiting Anesthetic complications: no     Last Vitals:  Vitals:   03/09/20 0312 03/09/20 0920  BP: 132/78 115/60  Pulse: 86 91  Resp:  18  Temp: 36.6 C 36.7 C  SpO2: 100% 99%    Last Pain:  Vitals:   03/09/20 1030  TempSrc:   PainSc: 0-No pain                 Alphonsus Sias

## 2020-03-12 ENCOUNTER — Telehealth: Payer: Self-pay

## 2020-03-12 LAB — SURGICAL PATHOLOGY

## 2020-03-12 NOTE — Telephone Encounter (Signed)
I looked back at last office visits with me in 2019 and 2020 and I did not see any documentation on Sleep Apnea or Sleep Study.  Maybe the test was ordered by another provider or specialist?  If she would like me to order a Sleep Study, she will need an office visit to discuss and document it. We need a current office visit with the date on it and all the questionnaires answered so we can fax over the record, or else insurance will not accept it.  Nobie Putnam, DO [facility] 03/12/2020, 8:52 AM

## 2020-03-12 NOTE — Telephone Encounter (Signed)
Left detail message. 

## 2020-03-12 NOTE — Telephone Encounter (Signed)
Copied from [place] [id number]. Topic: General - Inquiry >> Mar 09, 2020  4:18 PM Richardo Priest, [place] wrote: Reason for CRM: Patient called in asking if an order for another sleep apnea test can be done, as she had one ordered early 2020 but due to covid was unable to go. Patient recently had surgery and doctors recommend she get this done soon as her oxygen levels decreased significantly. Please advise.

## 2020-03-12 NOTE — Telephone Encounter (Signed)
Either is fine. In person or virtual can qualify for sleep study.  She will need accurate height and weight / BMI documented.  Nobie Putnam, [facility] 03/12/2020, 2:58 PM

## 2020-03-12 NOTE — Telephone Encounter (Signed)
Scheduled virtual apt for tomorrow, does she need in person or virtual will be okay for sleep study paper work.

## 2020-03-13 ENCOUNTER — Telehealth (INDEPENDENT_AMBULATORY_CARE_PROVIDER_SITE_OTHER): Payer: 59 | Admitting: Family Medicine

## 2020-03-13 ENCOUNTER — Encounter: Payer: Self-pay | Admitting: Family Medicine

## 2020-03-13 ENCOUNTER — Other Ambulatory Visit: Payer: Self-pay

## 2020-03-13 VITALS — Ht 59.0 in | Wt 283.0 lb

## 2020-03-13 DIAGNOSIS — Z6841 Body Mass Index (BMI) 40.0 and over, adult: Secondary | ICD-10-CM

## 2020-03-13 DIAGNOSIS — G4733 Obstructive sleep apnea (adult) (pediatric): Secondary | ICD-10-CM | POA: Insufficient documentation

## 2020-03-13 DIAGNOSIS — R29818 Other symptoms and signs involving the nervous system: Secondary | ICD-10-CM

## 2020-03-13 NOTE — Progress Notes (Signed)
Virtual Visit via Telephone The purpose of this virtual visit is to provide medical care while limiting exposure to the novel coronavirus (COVID19) for both patient and office staff.  Consent was obtained for phone visit:  Yes.   Answered questions that patient had about telehealth interaction:  Yes.   I discussed the limitations, risks, security and privacy concerns of performing an evaluation and management service by telephone. I also discussed with the patient that there may be a patient responsible charge related to this service. The patient expressed understanding and agreed to proceed.  Patient Location: Home Provider Location: [facility])  ---------------------------------------------------------------------- Chief Complaint  Patient presents with  . sleep study    Obstructive Sleep Apnea    S: Reviewed CMA documentation. I have called patient and gathered additional HPI as follows:  Suspected Sleep Apnea History for past 4-5 years with snoring and concerns for sleep apnea. No prior sleep study. She did have hysterectomy (lap supracervical hysterectomy, partial on 03/08/20) she was under general anesthesia and surgeon told her that she had issues with breathing and oxygen while under anesthesia, and that they were very concerned for sleep apnea, asked her to discuss it with PCP soon. Previously we attempted to order a sleep study prior to [place] but did not proceed. - Father has sleep apnea already. - She is interested in home sleep study  Epworth Sleepiness Scale Total Score: 20 Sitting and reading - 3 Watching TV - 3 Sitting inactive in a public place - 3 As a passenger in a car for an hour without a break - 3 Lying down to rest in the afternoon when circumstances permit - 3 Sitting and talking to someone - 1 Sitting quietly after a lunch without alcohol - 3 In a car, while stopped for a few minutes in traffic -  1  -----------------------------------  STOP-Bang OSA scoring Snoring yes   Tiredness yes   Observed apneas yes   Pressure HTN no   BMI > 35 kg/m2 yes   Age > 50  no   Neck (female >17 in; Female >16 in)  yes 68"  Gender female no   OSA risk low (0-2)  OSA risk intermediate (3-4)  OSA risk high (5+)  Total: 5 high risk     Denies any fevers, chills, sweats, body ache, cough, shortness of breath, sinus pain or pressure, headache, abdominal pain, diarrhea  Past Medical History:  Diagnosis Date  . Abnormal Pap smear of anus    HGSIL  . Anemia   . Back pain   . Cervical dysplasia    CIN III  . Endometriosis   . Gallbladder problem   . Headache   . Heart murmur   . Leg edema   . Premature delivery    x2   Social History   Tobacco Use  . Smoking status: Never Smoker  . Smokeless tobacco: Never Used  Substance Use Topics  . Alcohol use: No    Alcohol/week: 0.0 standard drinks  . Drug use: No    Current Outpatient Medications:  .  furosemide (LASIX) 10 MG/ML solution, Take 10 mg by mouth daily., Disp: , Rfl:  .  ibuprofen (ADVIL) 600 MG tablet, Take 1 tablet (600 mg total) by mouth every 6 (six) hours as needed for mild pain or cramping., Disp: 40 tablet, Rfl: 1 .  loratadine (CLARITIN) 10 MG tablet, Take 10 mg by mouth daily., Disp: , Rfl:  .  oxyCODONE-acetaminophen (PERCOCET/ROXICET) 5-325 MG  tablet, Take 1 tablet by mouth every 4 (four) hours as needed for moderate pain., Disp: 30 tablet, Rfl: 0  Depression screen [facility] 2/9 03/13/2020 07/12/2019 01/13/2019  Decreased Interest 0 0 0  Down, Depressed, Hopeless 0 0 0  PHQ - 2 Score 0 0 0  Altered sleeping - - -  Tired, decreased energy - - -  Change in appetite - - -  Feeling bad or failure about yourself  - - -  Trouble concentrating - - -  Moving slowly or fidgety/restless - - -  Suicidal thoughts - - -  PHQ-9 Score - - -  Difficult doing work/chores - - -    No flowsheet data  found.  -------------------------------------------------------------------------- O: No physical exam performed due to remote telephone encounter.  Ht 4\' 11"  (1.499 m)   Wt 283 lb (128.4 kg)   BMI 57.16 kg/m    Recent Results (from the past 2160 hour(s))  SARS CORONAVIRUS 2 (TAT 6-24 HRS) Nasopharyngeal Nasopharyngeal Swab     Status: None   Collection Time: 03/06/20  8:35 AM   Specimen: Nasopharyngeal Swab  Result Value Ref Range   SARS Coronavirus 2 NEGATIVE NEGATIVE    Comment: (NOTE) SARS-CoV-2 target nucleic acids are NOT DETECTED. The SARS-CoV-2 RNA is generally detectable in upper and lower respiratory specimens during the acute phase of infection. Negative results do not preclude SARS-CoV-2 infection, do not rule out co-infections with other pathogens, and should not be used as the sole basis for treatment or other patient management decisions. Negative results must be combined with clinical observations, patient history, and epidemiological information. The expected result is Negative. Fact Sheet for Patients: SugarRoll.be Fact Sheet for Healthcare Providers: https://www.woods-mathews.com/ This test is not yet approved or cleared by the [place] FDA and  has been authorized for detection and/or diagnosis of SARS-CoV-2 by FDA under an Emergency Use Authorization (EUA). This EUA will remain  in effect (meaning this test can be used) for the duration of the COVID-19 declaration under Section 56 4(b)(1) of the Act, 21 U.S.C. section 360bbb-3(b)(1), unless the authorization is terminated or revoked sooner. Performed at [facility], [place] [address], [place], [place] [id number]   Basic metabolic panel     Status: Abnormal   Collection Time: 03/06/20  8:35 AM  Result Value Ref Range   Sodium 140 135 - 145 mmol/L   Potassium 3.5 3.5 - 5.1 mmol/L   Chloride 104 98 - 111 mmol/L   CO2 28 22 - 32 mmol/L   Glucose, Bld 108  (H) 70 - 99 mg/dL    Comment: Glucose reference range applies only to samples taken after fasting for at least 8 hours.   BUN 7 6 - 20 mg/dL   Creatinine, Ser 0.51 0.44 - 1.00 mg/dL   Calcium 8.6 (L) 8.9 - 10.3 mg/dL   GFR calc non Af Amer >60 >60 mL/min   GFR calc Af Amer >60 >60 mL/min   Anion gap 8 5 - 15    Comment: Performed at [facility], [address] [place],  [address]  CBC     Status: Abnormal   Collection Time: 03/06/20  8:35 AM  Result Value Ref Range   WBC 8.9 4.0 - 10.5 K/uL   RBC 4.65 3.87 - 5.11 MIL/uL   Hemoglobin 12.4 12.0 - 15.0 g/dL   HCT 39.5 36.0 - 46.0 %   MCV 84.9 80.0 - 100.0 fL   MCH 26.7 26.0 - 34.0 pg  MCHC 31.4 30.0 - 36.0 g/dL   RDW 14.6 11.5 - 15.5 %   Platelets 460 (H) 150 - 400 K/uL   nRBC 0.0 0.0 - 0.2 %    Comment: Performed at [facility], [address] [place], [place] [address]  Type and screen [place]     Status: None   Collection Time: 03/06/20  8:47 AM  Result Value Ref Range   ABO/RH(D) A POS    Antibody Screen NEG    Sample Expiration 03/20/2020,2359    Extend sample reason      NO TRANSFUSIONS OR PREGNANCY IN THE PAST 3 MONTHS Performed at [facility], [address] [place], [place] [address]   Pregnancy, urine POC     Status: None   Collection Time: 03/08/20  8:02 AM  Result Value Ref Range   Preg Test, Ur NEGATIVE NEGATIVE    Comment:        THE SENSITIVITY OF THIS METHODOLOGY IS >24 mIU/mL   ABO/Rh     Status: None   Collection Time: 03/08/20  8:40 AM  Result Value Ref Range   ABO/RH(D)      A POS Performed at [facility], [address], [place], [place] [address]   Surgical pathology     Status: None   Collection Time: 03/08/20 12:18 PM  Result Value Ref Range   SURGICAL PATHOLOGY      SURGICAL PATHOLOGY CASE: [id number] PATIENT: [facility] Surgical Pathology Report     Specimen Submitted: A. Uterus without cervix,  bilateral tubes  Clinical History: Abnormal uterine bleeding      DIAGNOSIS: A. UTERUS AND BILATERAL FALLOPIAN TUBES; SUPRACERVICAL HYSTERECTOMY WITH BILATERAL SALPINGECTOMY: - ENDOMETRIUM:      - DISORDERED PROLIFERATIVE.  NEGATIVE FOR ATYPIA / EIN AND MALIGNANCY. - MYOMETRIUM:      - LEIOMYOMA. - BILATERAL FALLOPIAN TUBES:      - NO SIGNIFICANT PATHOLOGIC ALTERATION.   GROSS DESCRIPTION: A. Labeled: Uterus without cervix, bilateral tubes Received: Formalin Weight: 81 grams (post formalin fixation) Dimensions: Morcellated, with an aggregate measurement of 8.0 x 5.5 x 3.2 cm. Serosa: Tan and smooth. Cervix: Surgically absent Endocervix: Not applicable Endometrial cavity:      Dimensions - markedly disrupted endometrial cavity is unable to be measured.      Thickness - 0.1-0.2 cm      Other findings - The ident ified endometrium is tan and smooth.  No polyps or abnormalities are grossly identified. Myometrium:     Thickness - markedly disrupted myometrium is unable to be measured at the time of grossing.     Other findings - 2 firm, well-circumscribed nodules measuring 0.6 and 0.7 cm.  The nodules have a pale-tan, whorled cut surface. Adnexa:      Fallopian tube           Measurements - 3.1 cm in length x 0.5 cm in diameter           Other findings - received freely floating in the specimen container.  Fimbriated.  The external surface is tan-purple and smooth. Sectioning displays a patent pinpoint lumen.      Opposite fallopian tube            Measurements - 2.2 cm in length x 0.5 cm in diameter           Other findings - received attached to the uterine fundus. Fimbria are absent.  The external surface is tan-purple and smooth. Sectioning displays  a patent pinpoint lumen. Other comments: Additionally received freely floating in the container is a 1.2 x 1.1 x 0.3 cm fragme nt of possible fallopian tube fimbria.  No abnormalities are grossly identified.  Block  summary: 1 - uterine serosa 2-6 - entire, possible endometrium 7 - myometrial nodules (entirely submitted) 8 - freely floating fallopian tube with longitudinally sectioned fimbria and cross-sections (entirely submitted) 9 - opposite, attached fallopian tube cross-sections (entirely submitted) 10 - freely floating, possible fallopian tube fimbria (bisected, entirely submitted)  Final Diagnosis performed by Betsy Pries, MD.   Electronically signed 03/12/2020 8:52:29AM The electronic signature indicates that the named Attending Pathologist has evaluated the specimen Technical component performed at [facility], [address], [place], [place] [address] Lab: [phone] Dir: Rush Farmer, MD, MMM  Professional component performed at [facility], [facility], [place], [place], [place] [address] Lab: [phone] Dir: Dellia Nims. Rubinas, MD   CBC     Status: Abnormal   Collection Time: 03/09/20  6:26 AM  Result Value Ref Range   WBC 10.1 4.0 - 10.5 K/uL   RBC 4.11 3.87 - 5.11 MIL/uL   Hemoglobin 10.9 (L) 12.0 - 15.0 g/dL   HCT 35.5 (L) 36.0 - 46.0 %   MCV 86.4 80.0 - 100.0 fL   MCH 26.5 26.0 - 34.0 pg   MCHC 30.7 30.0 - 36.0 g/dL   RDW 14.8 11.5 - 15.5 %   Platelets 399 150 - 400 K/uL   nRBC 0.0 0.0 - 0.2 %    Comment: Performed at [facility], [address] [place], [place] [id number]  Basic metabolic panel     Status: Abnormal   Collection Time: 03/09/20  6:26 AM  Result Value Ref Range   Sodium 138 135 - 145 mmol/L   Potassium 3.5 3.5 - 5.1 mmol/L   Chloride 100 98 - 111 mmol/L   CO2 30 22 - 32 mmol/L   Glucose, Bld 123 (H) 70 - 99 mg/dL    Comment: Glucose reference range applies only to samples taken after fasting for at least 8 hours.   BUN 9 6 - 20 mg/dL   Creatinine, Ser 0.64 0.44 - 1.00 mg/dL   Calcium 8.4 (L) 8.9 - 10.3 mg/dL   GFR calc non Af Amer >60 >60 mL/min   GFR calc Af Amer >60 >60 mL/min   Anion gap 8 5 - 15    Comment:  Performed at [facility], [address], [place], [place] [address]    -------------------------------------------------------------------------- A&P:  Problem List Items Addressed This Visit    Suspected sleep apnea - Primary   Morbid obesity with BMI of 45.0-49.9, adult ([place])     Persistent clinical concern for several years with suspected obstructive sleep apnea given reported symptoms with witnessed apnea, snoring and sleep disturbance, fatigue excessive daytime sleepiness. - Screening: ESS score 20 very high risk / STOP-Bang Score 5 high risk - Neck Circumference: >16" - Co-morbidities: Morbid Obesity BMI >50  Plan: 1. Discussion on initial diagnosis and testing for OSA, risk factors, management, complications 2. Agree to proceed with sleep study testing based on clinical concerns - advised patient that we can fax order to local - [facility] to pursue initial PSG, will request in home study and she can follow their protocol if need in lab or further CPAP titration next. 3. Encourage lifestyle diet, exercise weight loss to help improving breathing as well.   No orders of the defined types  were placed in this encounter.   Follow-up: - Return in 62 to 90 days for follow-up OSA after sleep study  Patient verbalizes understanding with the above medical recommendations including the limitation of remote medical advice.  Specific follow-up and call-back criteria were given for patient to follow-up or seek medical care more urgently if needed.   - Time spent in direct consultation with patient on phone: 9 minutes   Nobie Putnam, [facility] 03/13/2020, 1:26 PM

## 2020-03-13 NOTE — Patient Instructions (Addendum)
Stay tuned for order faxed to sleep center. Call them if not heard back in 1 week.  [facility] Address: [place] [address], [place], [place] [address] Phone: [phone]  I will request home sleep study but ultimately up to them/insurance.  Please schedule a Follow-up Appointment to: Return in about 3 months (around 06/13/2020) for 3 month follow-up Sleep Study.  If you have any other questions or concerns, please feel free to call the office or send a message through [place]. You may also schedule an earlier appointment if necessary.  Additionally, you may be receiving a survey about your experience at our office within a few days to 1 week by e-mail or mail. We value your feedback.  Nobie Putnam, DO [place]

## 2020-03-13 NOTE — Telephone Encounter (Signed)
Patient apt is scheduled aware about BMI.

## 2020-03-15 ENCOUNTER — Ambulatory Visit (INDEPENDENT_AMBULATORY_CARE_PROVIDER_SITE_OTHER): Payer: 59 | Admitting: Obstetrics and Gynecology

## 2020-03-15 ENCOUNTER — Other Ambulatory Visit: Payer: Self-pay

## 2020-03-15 ENCOUNTER — Encounter: Payer: Self-pay | Admitting: Obstetrics and Gynecology

## 2020-03-15 VITALS — BP 140/90 | Ht 60.0 in | Wt 283.0 lb

## 2020-03-15 DIAGNOSIS — Z4889 Encounter for other specified surgical aftercare: Secondary | ICD-10-CM

## 2020-03-15 MED ORDER — OXYCODONE-ACETAMINOPHEN 5-325 MG PO TABS: 1.0000 | ORAL_TABLET | ORAL | 0 refills | Status: DC | PRN

## 2020-03-15 NOTE — Progress Notes (Signed)
Postoperative Follow-up Patient presents post op from laparoscopic supracervical hysterectomy, bilateral salpingectomy 1weeks ago for abnormal uterine bleeding.  Subjective: Patient reports some improvement in her preop symptoms. Eating a regular diet without difficulty. Pain is controlled with current analgesics. Medications being used: prescription NSAID's including ibuprofen (Motrin) and narcotic analgesics including oxycodone/acetaminophen (Percocet, Tylox).  Activity: normal activities of daily living.  Objective: Blood pressure 140/90, height 5' (1.524 m), weight 283 lb (128.4 kg).  General: NAD Pulmonary: no increased work of breathing Abdomen: soft, non-tender, non-distended, incision(s) D/C/I Extremities: no edema Neurologic: normal gait    Admission on 03/08/2020, Discharged on 03/09/2020  Component Date Value Ref Range Status  . ABO/RH(D) 03/08/2020    Final                   Value:A POS Performed at [facility], [address] [place], [place] [address]   . Preg Test, Ur 03/08/2020 NEGATIVE  NEGATIVE Final   Comment:        THE SENSITIVITY OF THIS METHODOLOGY IS >24 mIU/mL   . SURGICAL PATHOLOGY 03/08/2020    Final-Edited                   Value:SURGICAL PATHOLOGY CASE: [id number] PATIENT: Cay Morrell [facility] Surgical Pathology Report     Specimen Submitted: A. Uterus without cervix, bilateral tubes  Clinical History: Abnormal uterine bleeding      DIAGNOSIS: A. UTERUS AND BILATERAL FALLOPIAN TUBES; SUPRACERVICAL HYSTERECTOMY WITH BILATERAL SALPINGECTOMY: - ENDOMETRIUM:      - DISORDERED PROLIFERATIVE.  NEGATIVE FOR ATYPIA / EIN AND MALIGNANCY. - MYOMETRIUM:      - LEIOMYOMA. - BILATERAL FALLOPIAN TUBES:      - NO SIGNIFICANT PATHOLOGIC ALTERATION.   GROSS DESCRIPTION: A. Labeled: Uterus without cervix, bilateral tubes Received: Formalin Weight: 81 grams (post formalin fixation) Dimensions: Morcellated, with an aggregate  measurement of 8.0 x 5.5 x 3.2 cm. Serosa: Tan and smooth. Cervix: Surgically absent Endocervix: Not applicable Endometrial cavity:      Dimensions - markedly disrupted endometrial cavity is unable to be measured.      Thickness - 0.1-0.2 cm      Other findings - The ident                         ified endometrium is tan and smooth.  No polyps or abnormalities are grossly identified. Myometrium:     Thickness - markedly disrupted myometrium is unable to be measured at the time of grossing.     Other findings - 2 firm, well-circumscribed nodules measuring 0.6 and 0.7 cm.  The nodules have a pale-tan, whorled cut surface. Adnexa:      Fallopian tube           Measurements - 3.1 cm in length x 0.5 cm in diameter           Other findings - received freely floating in the specimen container.  Fimbriated.  The external surface is tan-purple and smooth. Sectioning displays a patent pinpoint lumen.      Opposite fallopian tube            Measurements - 2.2 cm in length x 0.5 cm in diameter           Other findings - received attached to the uterine fundus. Fimbria are absent.  The external surface is tan-purple and smooth. Sectioning displays a patent pinpoint lumen. Other comments: Additionally received freely floating  in the container is a 1.2 x 1.1 x 0.3 cm fragme                         nt of possible fallopian tube fimbria.  No abnormalities are grossly identified.  Block summary: 1 - uterine serosa 2-6 - entire, possible endometrium 7 - myometrial nodules (entirely submitted) 8 - freely floating fallopian tube with longitudinally sectioned fimbria and cross-sections (entirely submitted) 9 - opposite, attached fallopian tube cross-sections (entirely submitted) 10 - freely floating, possible fallopian tube fimbria (bisected, entirely submitted)  Final Diagnosis performed by Betsy Pries, MD.   Electronically signed 03/12/2020 8:52:29AM The electronic signature indicates  that the named Attending Pathologist has evaluated the specimen Technical component performed at [facility], [address], [place], [place] [address] Lab: [phone] Dir: Rush Farmer, MD, MMM  Professional component performed at [facility], [facility], [place], [place], [place] [address] Lab: [phone] Dir: Dellia Nims. Rubinas, MD   . WBC 03/09/2020 10.1  4.0 - 10.5 K/uL Final  . RBC 03/09/2020 4.11  3.87 - 5.11 MIL/uL Final  . Hemoglobin 03/09/2020 10.9* 12.0 - 15.0 g/dL Final  . HCT 03/09/2020 35.5* 36.0 - 46.0 % Final  . MCV 03/09/2020 86.4  80.0 - 100.0 fL Final  . MCH 03/09/2020 26.5  26.0 - 34.0 pg Final  . MCHC 03/09/2020 30.7  30.0 - 36.0 g/dL Final  . RDW 03/09/2020 14.8  11.5 - 15.5 % Final  . Platelets 03/09/2020 399  150 - 400 K/uL Final  . nRBC 03/09/2020 0.0  0.0 - 0.2 % Final   Performed at [facility], [address], [place], [place] [address]  . Sodium 03/09/2020 138  135 - 145 mmol/L Final  . Potassium 03/09/2020 3.5  3.5 - 5.1 mmol/L Final  . Chloride 03/09/2020 100  98 - 111 mmol/L Final  . CO2 03/09/2020 30  22 - 32 mmol/L Final  . Glucose, Bld 03/09/2020 123* 70 - 99 mg/dL Final   Glucose reference range applies only to samples taken after fasting for at least 8 hours.  . BUN 03/09/2020 9  6 - 20 mg/dL Final  . Creatinine, Ser 03/09/2020 0.64  0.44 - 1.00 mg/dL Final  . Calcium 03/09/2020 8.4* 8.9 - 10.3 mg/dL Final  . GFR calc non Af Amer 03/09/2020 >60  >60 mL/min Final  . GFR calc Af Amer 03/09/2020 >60  >60 mL/min Final  . Anion gap 03/09/2020 8  5 - 15 Final   Performed at [facility], [address], [place], [place] [address]    Assessment: 39 y.o. s/p laparoscopic [facility], BS, cysto stable  Plan: Patient has done well after surgery with no apparent complications.  I have discussed the post-operative course to date, and the expected progress moving forward.  The patient understands what complications to be  concerned about.  I will see the patient in routine follow up, or sooner if needed.    Activity plan: No heavy lifting.   Malachy Mood, MD, [facility], [facility] 03/15/2020, 4:44 PM

## 2020-03-23 ENCOUNTER — Telehealth: Payer: Self-pay | Admitting: Obstetrics and Gynecology

## 2020-03-23 DIAGNOSIS — M25561 Pain in right knee: Secondary | ICD-10-CM | POA: Diagnosis not present

## 2020-03-23 NOTE — Telephone Encounter (Signed)
Patient reports having missed call no message left. Please advise if you were trying to reach patient? There was no note in chart.

## 2020-03-23 NOTE — Telephone Encounter (Signed)
No call from me 

## 2020-03-24 DIAGNOSIS — M25561 Pain in right knee: Secondary | ICD-10-CM | POA: Diagnosis not present

## 2020-03-28 ENCOUNTER — Encounter: Payer: Self-pay | Admitting: Family Medicine

## 2020-03-28 ENCOUNTER — Other Ambulatory Visit: Payer: Self-pay

## 2020-03-28 ENCOUNTER — Ambulatory Visit (INDEPENDENT_AMBULATORY_CARE_PROVIDER_SITE_OTHER): Payer: 59 | Admitting: Family Medicine

## 2020-03-28 ENCOUNTER — Other Ambulatory Visit: Payer: Self-pay | Admitting: Family Medicine

## 2020-03-28 VITALS — BP 131/67 | HR 101 | Temp 98.2°F | Resp 16 | Ht 60.0 in | Wt 282.6 lb

## 2020-03-28 DIAGNOSIS — R29818 Other symptoms and signs involving the nervous system: Secondary | ICD-10-CM

## 2020-03-28 DIAGNOSIS — R600 Localized edema: Secondary | ICD-10-CM

## 2020-03-28 DIAGNOSIS — I890 Lymphedema, not elsewhere classified: Secondary | ICD-10-CM

## 2020-03-28 DIAGNOSIS — Z6841 Body Mass Index (BMI) 40.0 and over, adult: Secondary | ICD-10-CM | POA: Diagnosis not present

## 2020-03-28 MED ORDER — POTASSIUM CHLORIDE ER 10 MEQ PO TBCR: 10.0000 meq | EXTENDED_RELEASE_TABLET | Freq: Every day | ORAL | 3 refills | Status: DC

## 2020-03-28 MED ORDER — FUROSEMIDE 40 MG PO TABS: 40.0000 mg | ORAL_TABLET | Freq: Every day | ORAL | 3 refills | Status: DC | PRN

## 2020-03-28 NOTE — Assessment & Plan Note (Signed)
Chronic worsening bilateral chronic LE edema Dx with Lymphedema by vascular specialist, has not returned for venous imaging  Most likely multifactorial with several provoking factors including morbid obesity, diet, hydration, lifestyle. No asymmetry or other provoking factor to suggest DVT, low risk Well's score. - UTD ECHOcardiogram 02/2018, ruled out cardiac cause  Plan: 1. Inc dose Furosemide to 40mg  daily PRN -can adjust to BID PRN if need only shrot term - Send potassium 37mEq daily with dose only can inc if need if taking often 2. Return to [facility] - she will call due for f/u and imaging - may indicate lymphedema pump and future wt loss surgery may still use RICE therapy, elevation compression

## 2020-03-28 NOTE — Progress Notes (Signed)
Subjective:    Patient ID: Stacey Taylor, female    DOB: Jan 06, 1981, 39 y.o.   MRN: [id number]  Stacey Taylor is a 39 y.o. female presenting on 03/28/2020 for Sleep Apnea (need higher dose of Furosemide)   HPI   Chronic Lower Extremity Edema / Lymphedema Morbid Obesity BMI >55  Chronic problem Has seen [facility] Dr Delana Meyer back in 08/2019, she has not returned. Dx with Lymphedema, waiting to schedule venous ultrasound and imaging. Recent worse s/p hysterectomy and weight gain Taking lasix 20mg  daily PRN now out would like higher dose. Was on K with it as well Temporary improvement on med and elevation - She says fluid not as severe in morning, usually worsening through day Admits difficulty extending legs or moving if too stiff or too swollen She would like bariatric surgery option to consult - she asks for info on this referral first Denies any numbness tingling weakness currently, redness leg pain  Suspected Sleep Apnea History for past 4-5 years with snoring and concerns for sleep apnea. No prior sleep study. She did have hysterectomy (lap supracervical hysterectomy, partial on 03/08/20) she was under general anesthesia and surgeon told her that she had issues with breathing and oxygen while under anesthesia, and that they were very concerned for sleep apnea, asked her to discuss it with PCP soon. Previously we attempted to order a sleep study prior to [place] but did not proceed. - Father has sleep apnea already. - She is interested in home sleep study  Epworth Sleepiness Scale Total Score: 20 Sitting and reading - 3 Watching TV - 3 Sitting inactive in a public place - 3 As a passenger in a car for an hour without a break - 3 Lying down to rest in the afternoon when circumstances permit - 3 Sitting and talking to someone - 1 Sitting quietly after a lunch without alcohol - 3 In a car, while stopped for a few minutes in traffic -  1  -----------------------------------  STOP-Bang OSA scoring Snoring yes   Tiredness yes   Observed apneas yes   Pressure HTN no   BMI > 35 kg/m2 yes   Age > 35  no   Neck (female >17 in; Female >55 in)  yes 36"  Gender female no   OSA risk low (0-2)  OSA risk intermediate (3-4)  OSA risk high (5+)  Total: 5 high risk       Depression screen [facility] 2/9 03/28/2020 03/13/2020 07/12/2019  Decreased Interest 0 0 0  Down, Depressed, Hopeless 0 0 0  PHQ - 2 Score 0 0 0  Altered sleeping - - -  Tired, decreased energy - - -  Change in appetite - - -  Feeling bad or failure about yourself  - - -  Trouble concentrating - - -  Moving slowly or fidgety/restless - - -  Suicidal thoughts - - -  PHQ-9 Score - - -  Difficult doing work/chores - - -    Social History   Tobacco Use  . Smoking status: Never Smoker  . Smokeless tobacco: Never Used  Substance Use Topics  . Alcohol use: No    Alcohol/week: 0.0 standard drinks  . Drug use: No    Review of Systems Per HPI unless specifically indicated above     Objective:    BP 131/67   Pulse (!) 101   Temp 98.2 F (36.8 C) (Temporal)   Resp 16   Ht 5' (1.524 m)  Wt 282 lb 9.6 oz (128.2 kg)   SpO2 98%   BMI 55.19 kg/m   Wt Readings from Last 3 Encounters:  03/28/20 282 lb 9.6 oz (128.2 kg)  03/15/20 283 lb (128.4 kg)  03/13/20 283 lb (128.4 kg)    Physical Exam Vitals and nursing note reviewed.  Constitutional:      General: She is not in acute distress.    Appearance: She is well-developed. She is obese. She is not diaphoretic.     Comments: Well-appearing, comfortable, cooperative  HENT:     Head: Normocephalic and atraumatic.  Eyes:     General:        Right eye: No discharge.        Left eye: No discharge.     Conjunctiva/sclera: Conjunctivae normal.  Cardiovascular:     Rate and Rhythm: Normal rate.  Pulmonary:     Effort: Pulmonary effort is normal.  Musculoskeletal:     Right lower leg: Edema  present.     Left lower leg: Edema present.  Skin:    General: Skin is warm and dry.     Findings: No erythema or rash.  Neurological:     Mental Status: She is alert and oriented to person, place, and time.  Psychiatric:        Behavior: Behavior normal.     Comments: Well groomed, good eye contact, normal speech and thoughts       Results for orders placed or performed during the hospital encounter of 03/08/20  CBC  Result Value Ref Range   WBC 10.1 4.0 - 10.5 K/uL   RBC 4.11 3.87 - 5.11 MIL/uL   Hemoglobin 10.9 (L) 12.0 - 15.0 g/dL   HCT 35.5 (L) 36.0 - 46.0 %   MCV 86.4 80.0 - 100.0 fL   MCH 26.5 26.0 - 34.0 pg   MCHC 30.7 30.0 - 36.0 g/dL   RDW 14.8 11.5 - 15.5 %   Platelets 399 150 - 400 K/uL   nRBC 0.0 0.0 - 0.2 %  Basic metabolic panel  Result Value Ref Range   Sodium 138 135 - 145 mmol/L   Potassium 3.5 3.5 - 5.1 mmol/L   Chloride 100 98 - 111 mmol/L   CO2 30 22 - 32 mmol/L   Glucose, Bld 123 (H) 70 - 99 mg/dL   BUN 9 6 - 20 mg/dL   Creatinine, Ser 0.64 0.44 - 1.00 mg/dL   Calcium 8.4 (L) 8.9 - 10.3 mg/dL   GFR calc non Af Amer >60 >60 mL/min   GFR calc Af Amer >60 >60 mL/min   Anion gap 8 5 - 15  Pregnancy, urine POC  Result Value Ref Range   Preg Test, Ur NEGATIVE NEGATIVE  ABO/Rh  Result Value Ref Range   ABO/RH(D)      A POS Performed at [facility], [address], [place], [place] [address]   Surgical pathology  Result Value Ref Range   SURGICAL PATHOLOGY      SURGICAL PATHOLOGY CASE: [id number] PATIENT: [facility] Report     Specimen Submitted: A. Uterus without cervix, bilateral tubes  Clinical History: Abnormal uterine bleeding      DIAGNOSIS: A. UTERUS AND BILATERAL FALLOPIAN TUBES; SUPRACERVICAL HYSTERECTOMY WITH BILATERAL SALPINGECTOMY: - ENDOMETRIUM:      - DISORDERED PROLIFERATIVE.  NEGATIVE FOR ATYPIA / EIN AND MALIGNANCY. - MYOMETRIUM:      - LEIOMYOMA. - BILATERAL FALLOPIAN  TUBES:      -  NO SIGNIFICANT PATHOLOGIC ALTERATION.   GROSS DESCRIPTION: A. Labeled: Uterus without cervix, bilateral tubes Received: Formalin Weight: 81 grams (post formalin fixation) Dimensions: Morcellated, with an aggregate measurement of 8.0 x 5.5 x 3.2 cm. Serosa: Tan and smooth. Cervix: Surgically absent Endocervix: Not applicable Endometrial cavity:      Dimensions - markedly disrupted endometrial cavity is unable to be measured.      Thickness - 0.1-0.2 cm      Other findings - The ident ified endometrium is tan and smooth.  No polyps or abnormalities are grossly identified. Myometrium:     Thickness - markedly disrupted myometrium is unable to be measured at the time of grossing.     Other findings - 2 firm, well-circumscribed nodules measuring 0.6 and 0.7 cm.  The nodules have a pale-tan, whorled cut surface. Adnexa:      Fallopian tube           Measurements - 3.1 cm in length x 0.5 cm in diameter           Other findings - received freely floating in the specimen container.  Fimbriated.  The external surface is tan-purple and smooth. Sectioning displays a patent pinpoint lumen.      Opposite fallopian tube            Measurements - 2.2 cm in length x 0.5 cm in diameter           Other findings - received attached to the uterine fundus. Fimbria are absent.  The external surface is tan-purple and smooth. Sectioning displays a patent pinpoint lumen. Other comments: Additionally received freely floating in the container is a 1.2 x 1.1 x 0.3 cm fragme nt of possible fallopian tube fimbria.  No abnormalities are grossly identified.  Block summary: 1 - uterine serosa 2-6 - entire, possible endometrium 7 - myometrial nodules (entirely submitted) 8 - freely floating fallopian tube with longitudinally sectioned fimbria and cross-sections (entirely submitted) 9 - opposite, attached fallopian tube cross-sections (entirely submitted) 10 - freely floating, possible  fallopian tube fimbria (bisected, entirely submitted)  Final Diagnosis performed by Betsy Pries, MD.   Electronically signed 03/12/2020 8:52:29AM The electronic signature indicates that the named Attending Pathologist has evaluated the specimen Technical component performed at [facility], [address], [place], [place] [address] Lab: [phone] Dir: Rush Farmer, MD, MMM  Professional component performed at [facility], [facility], [place], [place], [place] [address] Lab: [phone] Dir: Dellia Nims. Rubinas, MD       Assessment & Plan:   Problem List Items Addressed This Visit    Suspected sleep apnea   Morbid obesity with BMI of 50.0-59.9, adult ([place]) - Primary    Chronic worsening problem with abnormal weight gain BMI >55 She is considering bariatric surgery, info given on option for her to contact for more information      Lymphedema    See A&P Edema Underlying vascular etiology Worse with obesity      Relevant Medications   furosemide (LASIX) 40 MG tablet   potassium chloride (KLOR-CON) 10 MEQ tablet   Bilateral lower extremity edema    Chronic worsening bilateral chronic LE edema Dx with Lymphedema by vascular specialist, has not returned for venous imaging  Most likely multifactorial with several provoking factors including morbid obesity, diet, hydration, lifestyle. No asymmetry or other provoking factor to suggest DVT, low risk Well's score. - UTD ECHOcardiogram 02/2018, ruled out cardiac cause  Plan: 1. Inc dose Furosemide to 40mg  daily PRN -  can adjust to BID PRN if need only shrot term - Send potassium 77mEq daily with dose only can inc if need if taking often 2. Return to [facility] - she will call due for f/u and imaging - may indicate lymphedema pump and future wt loss surgery may still use RICE therapy, elevation compression      Relevant Medications   furosemide (LASIX) 40 MG tablet   potassium chloride (KLOR-CON) 10 MEQ  tablet      Persistent clinical concern for several years with suspected obstructive sleep apnea given reported symptoms with witnessed apnea, snoring and sleep disturbance, fatigue excessive daytime sleepiness. - Screening: ESS score 20 very high risk / STOP-Bang Score 5 high risk - Neck Circumference: >16" - Co-morbidities: Morbid Obesity BMI >50  Plan: 1. Discussion on initial diagnosis and testing for OSA, risk factors, management, complications 2. Agree to proceed with sleep study testing based on clinical concerns - advised patient that we can fax order to local - [facility] to pursue initial PSG, will request in home study and she can follow their protocol if need in lab or further CPAP titration next. 3. Encourage lifestyle diet, exercise weight loss to help improving breathing as well.  Last attempted order unable to be located - will refax today.  Meds ordered this encounter  Medications  . furosemide (LASIX) 40 MG tablet    Sig: Take 1 tablet (40 mg total) by mouth daily as needed for fluid or edema. May take a 2nd dose in 24 hours if needed.    Dispense:  45 tablet    Refill:  3  . potassium chloride (KLOR-CON) 10 MEQ tablet    Sig: Take 1 tablet (10 mEq total) by mouth daily.    Dispense:  30 tablet    Refill:  3      Follow up plan: Return in about 3 months (around 06/28/2020), or if symptoms worsen or fail to improve, for edema / weight / OSA.   Nobie Putnam, [facility] 03/28/2020, 9:49 AM

## 2020-03-28 NOTE — Patient Instructions (Addendum)
Thank you for coming to the office today.  Call [facility] specialist to schedule follow-up  Call them tomorrow or Friday to check status, we can refax or double check with them.  [facility] Address: [place], [place], [place] [address] Phone: [phone]  ----------------------------------------------  Dr Ruthy Dick or [facility]  [email or web address]  ------------  Increase Furosemide from 20 up to 40mg  dose - start this daily AS NEEDED if you can skip a day here and there that is great. Take potassium WITH it.  If taking regularly, we can increase Potassium.  May take 2nd dose in 24 hours as needed.   Please schedule a Follow-up Appointment to: Return in about 3 months (around 06/28/2020), or if symptoms worsen or fail to improve, for edema / weight / OSA.  If you have any other questions or concerns, please feel free to call the office or send a message through [place]. You may also schedule an earlier appointment if necessary.  Additionally, you may be receiving a survey about your experience at our office within a few days to 1 week by e-mail or mail. We value your feedback.  Nobie Putnam, DO [place]

## 2020-03-28 NOTE — Assessment & Plan Note (Signed)
See A&P Edema Underlying vascular etiology Worse with obesity

## 2020-03-28 NOTE — Assessment & Plan Note (Signed)
Chronic worsening problem with abnormal weight gain BMI >55 She is considering bariatric surgery, info given on option for her to contact for more information

## 2020-03-31 DIAGNOSIS — G4733 Obstructive sleep apnea (adult) (pediatric): Secondary | ICD-10-CM | POA: Diagnosis not present

## 2020-04-06 ENCOUNTER — Encounter: Payer: Self-pay | Admitting: Family Medicine

## 2020-04-07 DIAGNOSIS — G4733 Obstructive sleep apnea (adult) (pediatric): Secondary | ICD-10-CM | POA: Diagnosis not present

## 2020-04-08 DIAGNOSIS — M25561 Pain in right knee: Secondary | ICD-10-CM | POA: Diagnosis not present

## 2020-04-09 DIAGNOSIS — M25561 Pain in right knee: Secondary | ICD-10-CM | POA: Diagnosis not present

## 2020-04-11 ENCOUNTER — Telehealth: Payer: Self-pay | Admitting: Family Medicine

## 2020-04-11 NOTE — Telephone Encounter (Signed)
Copied from [place] [phone]. Topic: General - Inquiry >> Apr 10, 2020  4:58 PM Greggory Keen D wrote: Reason for CRM: Pt called asking if her sleep study results have came in yet?  She would like someone to call her back from the office .  The sleep study place ([facility]) said they faxed the results Monday. CB#  [phone]

## 2020-04-11 NOTE — Telephone Encounter (Signed)
Jasmine from [facility] states patient originally had Split study where she had too many events with CPAP machine so the conclusion was to get another sleep study with BiPAP titration. She had that done last Saturday and [facility] has only one physician that reads it so it will take week to get that result and they will fax the result and order to sign off. Also, patient notified with all update information.

## 2020-04-11 NOTE — Telephone Encounter (Signed)
There was a problem with the last encounter, I was having hard time adding to the note.  New telephone encounter created now  Copied from [place] [phone]. Topic: General - Inquiry >> Apr 10, 2020  4:58 PM Greggory Keen D wrote: Reason for CRM: Pt called asking if her sleep study results have came in yet?  She would like someone to call her back from the office .  The sleep study place ([facility]) said they faxed the results Monday. CB#  [phone]  I did receive a fax copy from [facility] from her sleep study done 03/31/20  It was a Split Night Study that showed Severe Sleep Apnea and CPAP/BiPAP machine was recommended.  ---------------------------------------  Can you call [facility] [phone]) after 1pm today to see if they need anything else on our end to get the patient her BiPAP equipment?  They may need to re-fax [place] any orders for her new BiPAP equipment to sign off on.  Nobie Putnam, [facility] 04/11/2020, 12:02 PM

## 2020-04-11 NOTE — Telephone Encounter (Signed)
Copied from [place] [phone]. Topic: General - Inquiry >> Apr 10, 2020  4:58 PM Greggory Keen D wrote: Reason for CRM: Pt called asking if her sleep study results have came in yet?  She would like someone to call her back from the office .  The sleep study place ([facility]) said they faxed the results Monday. CB#  [phone]

## 2020-04-16 ENCOUNTER — Other Ambulatory Visit: Payer: Self-pay | Admitting: Unknown Physician Specialty

## 2020-04-16 DIAGNOSIS — J309 Allergic rhinitis, unspecified: Secondary | ICD-10-CM | POA: Diagnosis not present

## 2020-04-16 DIAGNOSIS — D101 Benign neoplasm of tongue: Secondary | ICD-10-CM | POA: Diagnosis not present

## 2020-04-18 LAB — SURGICAL PATHOLOGY

## 2020-04-20 ENCOUNTER — Ambulatory Visit (INDEPENDENT_AMBULATORY_CARE_PROVIDER_SITE_OTHER): Payer: 59 | Admitting: Obstetrics and Gynecology

## 2020-04-20 ENCOUNTER — Encounter: Payer: Self-pay | Admitting: Obstetrics and Gynecology

## 2020-04-20 ENCOUNTER — Other Ambulatory Visit: Payer: Self-pay

## 2020-04-20 VITALS — BP 142/98 | HR 92 | Ht 60.0 in | Wt 281.0 lb

## 2020-04-20 DIAGNOSIS — Z4889 Encounter for other specified surgical aftercare: Secondary | ICD-10-CM

## 2020-04-20 DIAGNOSIS — G4733 Obstructive sleep apnea (adult) (pediatric): Secondary | ICD-10-CM | POA: Diagnosis not present

## 2020-04-20 NOTE — Progress Notes (Signed)
      Postoperative Follow-up Patient presents post op from laproscopic supracervical hysterectomy 6weeks ago for abnormal uterine bleeding.  Subjective: Patient reports marked improvement in her preop symptoms. Eating a regular diet without difficulty. The patient is not having any pain.  Activity: normal activities of daily living.  Objective: Blood pressure (!) 142/98, pulse 92, height 5' (1.524 m), weight 281 lb (127.5 kg), last menstrual period 01/06/2020.  General: NAD Pulmonary: no increased work of breathing Abdomen: soft, non-tender, non-distended, incision(s) D/C/I Extremities: no edema Neurologic: normal gait    Orders Only on 04/16/2020  Component Date Value Ref Range Status  . SURGICAL PATHOLOGY 04/16/2020    Final-Edited                   Value:Surgical Pathology CASE: [id number] PATIENT: [facility] Report     Specimen Submitted: A. Tongue lesion, posterior  Clinical History: None provided      DIAGNOSIS: A. TONGUE LESION, POSTERIOR; BIOPSY: - SQUAMOUS PAPILLOMA. - NEGATIVE FOR DYSPLASIA AND MALIGNANCY.   GROSS DESCRIPTION: A. Labeled: Tongue lesion (per requisition posterior) Received: Formalin Tissue fragment(s): 1 Size: 0.7 x 0.2 x 0.2 cm Description: Tan-white soft tissue with the presumed resection margin inked blue Entirely submitted in 1 cassette.   Final Diagnosis performed by Betsy Pries, MD.   Electronically signed 04/18/2020 10:22:13AM The electronic signature indicates that the named Attending Pathologist has evaluated the specimen Technical component performed at [facility], [address] [place], [place] [address] Lab: [phone] Dir: Rush Farmer, MD, MMM  Professional component performed at [facility], [facility], [place], [place], [place] [address] Lab: [phone] Dir: Dellia Nims. Reuel Derby, MD     Assessment: 39 y.o. s/p laparoscopic  supracervical hysterectomy stable  Plan: Patient has done well after surgery with no apparent complications.  I have discussed the post-operative course to date, and the expected progress moving forward.  The patient understands what complications to be concerned about.  I will see the patient in routine follow up, or sooner if needed.    Activity plan: No restriction.   Malachy Mood, MD, [facility], [facility] 04/20/2020, 9:46 AM

## 2020-04-23 DIAGNOSIS — M25561 Pain in right knee: Secondary | ICD-10-CM | POA: Diagnosis not present

## 2020-04-24 DIAGNOSIS — M25561 Pain in right knee: Secondary | ICD-10-CM | POA: Diagnosis not present

## 2020-05-08 DIAGNOSIS — M25561 Pain in right knee: Secondary | ICD-10-CM | POA: Diagnosis not present

## 2020-05-09 DIAGNOSIS — M25561 Pain in right knee: Secondary | ICD-10-CM | POA: Diagnosis not present

## 2020-05-20 DIAGNOSIS — G4733 Obstructive sleep apnea (adult) (pediatric): Secondary | ICD-10-CM | POA: Diagnosis not present

## 2020-05-23 DIAGNOSIS — M25561 Pain in right knee: Secondary | ICD-10-CM | POA: Diagnosis not present

## 2020-05-24 DIAGNOSIS — M25561 Pain in right knee: Secondary | ICD-10-CM | POA: Diagnosis not present

## 2020-05-29 ENCOUNTER — Other Ambulatory Visit: Payer: Self-pay

## 2020-05-29 ENCOUNTER — Ambulatory Visit (INDEPENDENT_AMBULATORY_CARE_PROVIDER_SITE_OTHER): Payer: 59 | Admitting: Family Medicine

## 2020-05-29 ENCOUNTER — Encounter: Payer: Self-pay | Admitting: Family Medicine

## 2020-05-29 VITALS — BP 123/64 | HR 94 | Temp 97.8°F | Resp 16 | Ht 60.0 in | Wt 285.6 lb

## 2020-05-29 DIAGNOSIS — Z1152 Encounter for screening for COVID-19: Secondary | ICD-10-CM

## 2020-05-29 DIAGNOSIS — G4733 Obstructive sleep apnea (adult) (pediatric): Secondary | ICD-10-CM | POA: Diagnosis not present

## 2020-05-29 DIAGNOSIS — I890 Lymphedema, not elsewhere classified: Secondary | ICD-10-CM

## 2020-05-29 DIAGNOSIS — R600 Localized edema: Secondary | ICD-10-CM | POA: Diagnosis not present

## 2020-05-29 NOTE — Progress Notes (Signed)
Subjective:    Patient ID: Stacey Taylor, female    DOB: 1981/07/06, 39 y.o.   MRN: [id number]  Stacey Taylor is a 39 y.o. female presenting on 05/29/2020 for Sleep Apnea   HPI   OSA with hypoxia Recent course reviewed. Followed by [facility]. 03/31/20 - Split night CPAP Titration - severe, AHI 155, nasal BIPAP titration, start 13/8cm, diagnosed OSA  04/13/20 - we ordered BIAPP Additional information. Desaturations significant, maybe central hypopnea component REM sleep, improved with BiPAP but still desat, improve with oxygen but still desat, they want to do 1 month trial w/o oxygen then do overnight oximetry  Today she reports now after 1 month without supplemental oxygen at night. She is requesting the next test with repeat overnight oximetry to be ordered. She was told can do at home. Then may meet criteria for oxygen supplement.  She is using BiPAP machine every night. She is benefiting from therapy. Significantly improved sleep overall. Less waking up, now she says likely less getting up to urinate overnight, has increased fluid build up.  Lymphedema / Chronic Venous Stasis Taking Lasix 40mg  most days, skips some if going out or active. Asking about taking higher dose.  COVID vaccine concerns. Not up to date on covid vaccine yet. Worried about possible reaction to covid vaccine. 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 Positive for autoimmune, [facility], negative RF and Lupus. She is worried for possible autoimmune reaction. She has had prior bad reactions to flu vaccine in past. She did get the flu after vaccine once and have chronic cough.    Depression screen [facility] 2/9 03/28/2020 03/13/2020 07/12/2019  Decreased Interest 0 0 0  Down, Depressed, Hopeless 0 0 0  PHQ - 2 Score 0 0 0  Altered sleeping - - -  Tired, decreased energy - - -  Change in appetite - - -  Feeling bad or failure about yourself  - - -  Trouble concentrating - -  -  Moving slowly or fidgety/restless - - -  Suicidal thoughts - - -  PHQ-9 Score - - -  Difficult doing work/chores - - -    Social History   Tobacco Use  . Smoking status: Never Smoker  . Smokeless tobacco: Never Used  Vaping Use  . Vaping Use: Never used  Substance Use Topics  . Alcohol use: No    Alcohol/week: 0.0 standard drinks  . Drug use: No    Review of Systems Per HPI unless specifically indicated above     Objective:    BP (!) 123/64   Pulse 94   Temp 97.8 F (36.6 C) (Temporal)   Resp 16   Ht 5' (1.524 m)   Wt (!) 285 lb 9.6 oz (129.5 kg)   LMP 01/06/2020   SpO2 100%   BMI 55.78 kg/m   Wt Readings from Last 3 Encounters:  05/29/20 (!) 285 lb 9.6 oz (129.5 kg)  04/20/20 281 lb (127.5 kg)  03/28/20 282 lb 9.6 oz (128.2 kg)    Physical Exam Vitals and nursing note reviewed.  Constitutional:      General: She is not in acute distress.    Appearance: She is well-developed. She is obese. She is not diaphoretic.     Comments: Well-appearing, comfortable, cooperative  HENT:     Head: Normocephalic and atraumatic.  Eyes:     General:        Right eye: No discharge.  Left eye: No discharge.     Conjunctiva/sclera: Conjunctivae normal.  Cardiovascular:     Rate and Rhythm: Normal rate.  Pulmonary:     Effort: Pulmonary effort is normal.  Musculoskeletal:     Right lower leg: Edema present.     Left lower leg: Edema present.  Skin:    General: Skin is warm and dry.     Findings: No erythema or rash.  Neurological:     Mental Status: She is alert and oriented to person, place, and time.  Psychiatric:        Behavior: Behavior normal.     Comments: Well groomed, good eye contact, normal speech and thoughts        Results for orders placed or performed in visit on 04/16/20  Surgical pathology  Result Value Ref Range   SURGICAL PATHOLOGY      Surgical Pathology CASE: [id number] PATIENT: Stacey Taylor Surgical Pathology  Report     Specimen Submitted: A. Tongue lesion, posterior  Clinical History: None provided      DIAGNOSIS: A. TONGUE LESION, POSTERIOR; BIOPSY: - SQUAMOUS PAPILLOMA. - NEGATIVE FOR DYSPLASIA AND MALIGNANCY.   GROSS DESCRIPTION: A. Labeled: Tongue lesion (per requisition posterior) Received: Formalin Tissue fragment(s): 1 Size: 0.7 x 0.2 x 0.2 cm Description: Tan-white soft tissue with the presumed resection margin inked blue Entirely submitted in 1 cassette.   Final Diagnosis performed by Betsy Pries, MD.   Electronically signed 04/18/2020 10:22:13AM The electronic signature indicates that the named Attending Pathologist has evaluated the specimen Technical component performed at [facility], [address] [place], [place] [address] Lab: [phone] Dir: Rush Farmer, MD, MMM  Professional component performed at [facility], [facility], [place], [place], [place] [address] Lab: [phone] Dir: Dellia Nims. Rubinas, MD       Assessment & Plan:   Problem List Items Addressed This Visit    OSA (obstructive sleep apnea) - Primary   Lymphedema   Relevant Medications   furosemide (LASIX) 40 MG tablet   Bilateral lower extremity edema   Relevant Medications   furosemide (LASIX) 40 MG tablet    Other Visit Diagnoses    Encounter for screening for COVID-19       Relevant Orders   SAR CoV2 Serology (COVID 19)AB(IGG)IA      #OSA on BIPAP Concern hypoxia overnight Improved on BIPAP nightly adherence now, dramatic improvement in sleeping Will pursue ordering Overnight Oximetry through [facility]. Will fax order form 05/30/20 May need supplemental oxygen therapy overnight with BIPAP  #Lymphedema May double dose of Lasix 40mg  to BID dosing if indicated for swelling.  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 Based on current guidelines and  recommendations. She does not meet any medical criteria to be exempt. Will recommend [employer] or Moderna vaccine. She will pursue this and we discussed her concerns today. - Will add antibody blood test for COVID for her request, she can return for this when ready, may not guarantee accurate result on antibody testing.  No orders of the defined types were placed in this encounter.     Follow up plan: Return if symptoms worsen or fail to improve.  Nobie Putnam, [facility] 05/29/2020, 4:34 PM

## 2020-05-29 NOTE — Patient Instructions (Addendum)
Thank you for coming to the office today.  Recommend either [employer] or [employer].  Increase Furosemide 40mg  to twice a day as needed. Let me know if need rx  We will fax Overnight Oximetry to [facility]  COVID antibody test ordered. Morning lab only when ready it is released. Try to schedule in advance 8am to 1130am.  Please schedule a Follow-up Appointment to: Return if symptoms worsen or fail to improve.  If you have any other questions or concerns, please feel free to call the office or send a message through [place]. You may also schedule an earlier appointment if necessary.  Additionally, you may be receiving a survey about your experience at our office within a few days to 1 week by e-mail or mail. We value your feedback.  Nobie Putnam, DO [place]

## 2020-06-08 ENCOUNTER — Other Ambulatory Visit: Payer: Self-pay

## 2020-06-08 ENCOUNTER — Encounter: Payer: Self-pay | Admitting: Family Medicine

## 2020-06-08 ENCOUNTER — Ambulatory Visit (INDEPENDENT_AMBULATORY_CARE_PROVIDER_SITE_OTHER): Payer: 59 | Admitting: Family Medicine

## 2020-06-08 VITALS — BP 121/63 | HR 73 | Temp 97.7°F | Resp 16 | Ht 60.0 in | Wt 283.6 lb

## 2020-06-08 DIAGNOSIS — R1032 Left lower quadrant pain: Secondary | ICD-10-CM | POA: Diagnosis not present

## 2020-06-08 DIAGNOSIS — N809 Endometriosis, unspecified: Secondary | ICD-10-CM | POA: Diagnosis not present

## 2020-06-08 DIAGNOSIS — Z90710 Acquired absence of both cervix and uterus: Secondary | ICD-10-CM | POA: Diagnosis not present

## 2020-06-08 DIAGNOSIS — M25561 Pain in right knee: Secondary | ICD-10-CM | POA: Diagnosis not present

## 2020-06-08 DIAGNOSIS — R109 Unspecified abdominal pain: Secondary | ICD-10-CM | POA: Diagnosis not present

## 2020-06-08 LAB — POCT URINALYSIS DIPSTICK
Bilirubin, UA: NEGATIVE
Glucose, UA: NEGATIVE
Ketones, UA: NEGATIVE
Leukocytes, UA: NEGATIVE
Nitrite, UA: NEGATIVE
Protein, UA: POSITIVE — AB
Spec Grav, UA: 1.02 (ref 1.010–1.025)
Urobilinogen, UA: 0.2 E.U./dL
pH, UA: 5 (ref 5.0–8.0)

## 2020-06-08 MED ORDER — DICYCLOMINE HCL 10 MG PO CAPS: 10.0000 mg | ORAL_CAPSULE | Freq: Three times a day (TID) | ORAL | 0 refills | Status: DC

## 2020-06-08 NOTE — Patient Instructions (Addendum)
Thank you for coming to the office today.  Try the Dicyclomine (Bentyl) 10mg  as needed up to 3-4 time a day with meal or as needed. For abdominal cramping / pressure  Urine does show moderate blood and trace protein no other major infection markers, we will rule out infection UTI with culture.  Last resort return to GYN because endometriosis can still cause cyclical symptoms without uterus  May try miralax as well  Recommend trying OTC Miralax 17g = 1 capful in large glass water once daily for now, try several days to see if working, goal is soft stool or BM 1-2 times daily, if too loose then reduce dose or try every other day. If not effective may need to increase it to 2 doses at once in AM or may do 1 in morning and 1 in afternoon/evening  - This medicine is very safe and can be used often without any problem and will not make you dehydrated. It is good for use on AS NEEDED BASIS or even MAINTENANCE therapy for longer term for several days to weeks at a time to help regulate bowel movements  Other more natural remedies or preventative treatment: - Increase hydration with water - Increase fiber in diet (high fiber foods = vegetables, leafy greens, oats/grains) - May take OTC Fiber supplement (metamucil powder or pill/gummy) - May try OTC Probiotic   Please schedule a Follow-up Appointment to: Return if symptoms worsen or fail to improve.  If you have any other questions or concerns, please feel free to call the office or send a message through [place]. You may also schedule an earlier appointment if necessary.  Additionally, you may be receiving a survey about your experience at our office within a few days to 1 week by e-mail or mail. We value your feedback.  Nobie Putnam, DO [place]

## 2020-06-08 NOTE — Progress Notes (Signed)
Subjective:    Patient ID: Stacey Taylor, female    DOB: Aug 01, 1981, 39 y.o.   MRN: [id number]  Stacey Taylor is a 39 y.o. female presenting on 06/08/2020 for Abdominal Pain (lower abdominal pressure as per patient passing gas helps in past so not sure the cause of her pain but denies UTI sxs or constipation or nausea or diarrhea)   HPI  LOWER ABDOMINAL PRESSURE vs PAIN / Cramping History of Endometriosis s/p hysterectomy Reports symptoms started 4-5 days ago, with symptoms of lower abdominal pressure Admits some relief with passing gas, and worse if having BM can have worse with cramping and it does provide relief after but symptoms can come back. She has history of s/p gallbladder removal and has more urgency or loose stools with meals which has not changed Followed by Stacey Taylor, hysterectomy surgery 03/2020. Denies dysuria, dark stool blood in stool, urinary frequency or urgency, fever chills, nausea vomiting   Depression screen Stacey Taylor 2/9 03/28/2020 03/13/2020 07/12/2019  Decreased Interest 0 0 0  Down, Depressed, Hopeless 0 0 0  PHQ - 2 Score 0 0 0  Altered sleeping - - -  Tired, decreased energy - - -  Change in appetite - - -  Feeling bad or failure about yourself  - - -  Trouble concentrating - - -  Moving slowly or fidgety/restless - - -  Suicidal thoughts - - -  PHQ-9 Score - - -  Difficult doing work/chores - - -    Social History   Tobacco Use  . Smoking status: Never Smoker  . Smokeless tobacco: Never Used  Vaping Use  . Vaping Use: Never used  Substance Use Topics  . Alcohol use: No    Alcohol/week: 0.0 standard drinks  . Drug use: No    Review of Systems Per HPI unless specifically indicated above     Objective:    BP 121/63   Pulse 73   Temp 97.7 F (36.5 C) (Temporal)   Resp 16   Ht 5' (1.524 m)   Wt 283 lb 9.6 oz (128.6 kg)   LMP 01/06/2020   SpO2 98%   BMI 55.39 kg/m   Wt Readings from Last 3 Encounters:  06/08/20 283 lb 9.6 oz  (128.6 kg)  05/29/20 (!) 285 lb 9.6 oz (129.5 kg)  04/20/20 281 lb (127.5 kg)    Physical Exam Vitals and nursing note reviewed.  Constitutional:      General: She is not in acute distress.    Appearance: She is well-developed. She is obese. She is not diaphoretic.     Comments: Well-appearing, comfortable, cooperative  HENT:     Head: Normocephalic and atraumatic.  Eyes:     General:        Right eye: No discharge.        Left eye: No discharge.     Conjunctiva/sclera: Conjunctivae normal.  Neck:     Thyroid: No thyromegaly.  Cardiovascular:     Rate and Rhythm: Normal rate and regular rhythm.  Pulmonary:     Effort: Pulmonary effort is normal.  Abdominal:     General: Bowel sounds are normal. There is no distension.     Palpations: Abdomen is soft.     Tenderness: There is no abdominal tenderness.  Musculoskeletal:        General: Normal range of motion.     Cervical back: Normal range of motion and neck supple.  Lymphadenopathy:  Cervical: No cervical adenopathy.  Skin:    General: Skin is warm and dry.     Findings: No erythema or rash.  Neurological:     Mental Status: She is alert and oriented to person, place, and time.  Psychiatric:        Behavior: Behavior normal.     Comments: Well groomed, good eye contact, normal speech and thoughts    Results for orders placed or performed in visit on 06/08/20  POCT Urinalysis Dipstick  Result Value Ref Range   Color, UA yellow    Clarity, UA clear    Glucose, UA Negative Negative   Bilirubin, UA neg    Ketones, UA neg    Spec Grav, UA 1.020 1.010 - 1.025   Blood, UA moderate    pH, UA 5.0 5.0 - 8.0   Protein, UA Positive (A) Negative   Urobilinogen, UA 0.2 0.2 or 1.0 E.U./dL   Nitrite, UA neg    Leukocytes, UA Negative Negative   Appearance     Odor         Assessment & Plan:   Problem List Items Addressed This Visit    S/P hysterectomy    Other Visit Diagnoses    Left lower quadrant abdominal pain     -  Primary   Relevant Medications   dicyclomine (BENTYL) 10 MG capsule   Other Relevant Orders   POCT Urinalysis Dipstick   Urine Culture   Endometriosis          Clinically with mixture of symptoms related to lower abdominal region, suggestive history for more functional bowel vs GYN problem. - Cannot rule out UTI. She is not having significant UTI symptoms, but requesting urine testing.  Check Urine Dipstick Moderate blood and trace protein Will send for urine culture to rule out No empiric antibiotic at this time.  Possible functional GI symptoms with abdominal cramping, will trial Dicyclomine PRN. May try miralax clean out if need, discuss bowel regimen.  Additionally, next step if not improving functionally, will recommend she return to [facility] to discuss her previous diagnosis endometriosis, may still be contributing to her symptoms even s/p hysterectomy.  Meds ordered this encounter  Medications  . dicyclomine (BENTYL) 10 MG capsule    Sig: Take 1 capsule (10 mg total) by mouth 4 (four) times daily -  before meals and at bedtime. As needed    Dispense:  30 capsule    Refill:  0      Follow up plan: Return if symptoms worsen or fail to improve.    Stacey Taylor, [facility] 06/08/2020, 11:34 AM

## 2020-06-09 DIAGNOSIS — M25561 Pain in right knee: Secondary | ICD-10-CM | POA: Diagnosis not present

## 2020-06-09 LAB — URINE CULTURE
MICRO NUMBER:: 10798056
SPECIMEN QUALITY:: ADEQUATE

## 2020-06-20 DIAGNOSIS — G4733 Obstructive sleep apnea (adult) (pediatric): Secondary | ICD-10-CM | POA: Diagnosis not present

## 2020-06-21 DIAGNOSIS — Z03818 Encounter for observation for suspected exposure to other biological agents ruled out: Secondary | ICD-10-CM | POA: Diagnosis not present

## 2020-06-21 DIAGNOSIS — Z20822 Contact with and (suspected) exposure to covid-19: Secondary | ICD-10-CM | POA: Diagnosis not present

## 2020-06-21 DIAGNOSIS — R519 Headache, unspecified: Secondary | ICD-10-CM | POA: Diagnosis not present

## 2020-06-23 DIAGNOSIS — M25561 Pain in right knee: Secondary | ICD-10-CM | POA: Diagnosis not present

## 2020-06-24 DIAGNOSIS — M25561 Pain in right knee: Secondary | ICD-10-CM | POA: Diagnosis not present

## 2020-06-26 ENCOUNTER — Telehealth: Payer: Self-pay | Admitting: Family Medicine

## 2020-06-26 NOTE — Telephone Encounter (Signed)
Patient does not want [facility] for future now and denied all the option with them, she will call insurance and sleep med for coverage and also like get her test result was asking if this can be released to her my chart if not then I can print the result and mail it or she can come by and pick it up.

## 2020-06-26 NOTE — Telephone Encounter (Signed)
I am not quite sure how to help.  We have a copy of every sleep study she has done on file, and we have discussed those results in detail already.  I cannot order oxygen for her if she does not meet criteria to use it.  One option is she can have a consultation with the Sleep Specialist at "[facility]" and speak with the sleep doctor there.  They may tell her the same thing though.  I do not know if her insurance will pay for repeat testing at this time, with sleep study and overnight oximetry.  I would recommend that she use the BIPAP machine without oxygen for now and she can follow-up in few months and we can possibly re order the oxygen test.  If she wants to check with her insurance if they will cover a repeat oxygen test that is up to her.  We could consider the "[employer]" company - I am not sure which company they use, it may still be [employer] company.  Nobie Putnam, [facility] 06/26/2020, 12:29 PM

## 2020-06-26 NOTE — Telephone Encounter (Signed)
Results of last 2 sleep study and overnight oxygen test printed, and ready for pick up. They are in my outbox.  Cannot release them to [employer].  Nobie Putnam, [facility] 06/26/2020, 5:09 PM

## 2020-06-26 NOTE — Telephone Encounter (Signed)
Copied from [place] [id number]. Topic: General - Other >> Jun 22, 2020 11:37 AM Keene Breath wrote: Reason for CRM: Patient would like the nurse to call her back regarding some questions she has about her sleep study.  CB# [phone]

## 2020-06-26 NOTE — Telephone Encounter (Signed)
As per patient the result from Overnight Pulse Oximetry  is not accurate since she was told by GYN who refused to do total hysterectomy since her oxygen level was dropping and also she woke up last Tuesday in the middle of night and check her pulse ox states,"HR--37 and oxygen level was 19," and the last sleep study they were offering her BIPAP, so does like to get copy of the result and asking for another sleep study company to get another Overnight Pulse Oximetry study done. Please suggest ?

## 2020-06-27 NOTE — Telephone Encounter (Signed)
Patient will pick up the sleep study result either today or tomorrow, also she states she called the insurance company and they will cover 2nd sleep study done with different sleep study center and she does not need any order from the provider and she has found the one sleep center that is affiliated with [facility] but don't remember the name.

## 2020-07-09 DIAGNOSIS — M25561 Pain in right knee: Secondary | ICD-10-CM | POA: Diagnosis not present

## 2020-07-10 ENCOUNTER — Other Ambulatory Visit: Payer: Self-pay

## 2020-07-10 ENCOUNTER — Encounter: Payer: Self-pay | Admitting: Primary Care

## 2020-07-10 ENCOUNTER — Ambulatory Visit (INDEPENDENT_AMBULATORY_CARE_PROVIDER_SITE_OTHER): Payer: 59 | Admitting: Primary Care

## 2020-07-10 VITALS — BP 124/80 | HR 83 | Temp 97.5°F | Ht 60.0 in | Wt 272.0 lb

## 2020-07-10 DIAGNOSIS — M25561 Pain in right knee: Secondary | ICD-10-CM | POA: Diagnosis not present

## 2020-07-10 DIAGNOSIS — G4733 Obstructive sleep apnea (adult) (pediatric): Secondary | ICD-10-CM | POA: Diagnosis not present

## 2020-07-10 NOTE — Patient Instructions (Signed)
Orders: BiPAP titration study re: severe obstructive sleep apnea/nocturnal hypoxia   Follow-up: 6-8 weeks with Dr. Halford Chessman (new sleep patient)   CPAP and BPAP Information CPAP and BPAP are methods of helping a person breathe with the use of air pressure. CPAP stands for "continuous positive airway pressure." BPAP stands for "bi-level positive airway pressure." In both methods, air is blown through your nose or mouth and into your air passages to help you breathe well. CPAP and BPAP use different amounts of pressure to blow air. With CPAP, the amount of pressure stays the same while you breathe in and out. With BPAP, the amount of pressure is increased when you breathe in (inhale) so that you can take larger breaths. Your health care provider will recommend whether CPAP or BPAP would be more helpful for you. Why are CPAP and BPAP treatments used? CPAP or BPAP can be helpful if you have:  Sleep apnea.  Chronic obstructive pulmonary disease (COPD).  Heart failure.  Medical conditions that weaken the muscles of the chest including muscular dystrophy, or neurological diseases such as amyotrophic lateral sclerosis (ALS).  Other problems that cause breathing to be weak, abnormal, or difficult. CPAP is most commonly used for obstructive sleep apnea (OSA) to keep the airways from collapsing when the muscles relax during sleep. How is CPAP or BPAP administered? Both CPAP and BPAP are provided by a small machine with a flexible plastic tube that attaches to a plastic mask. You wear the mask. Air is blown through the mask into your nose or mouth. The amount of pressure that is used to blow the air can be adjusted on the machine. Your health care provider will determine the pressure setting that should be used based on your individual needs. When should CPAP or BPAP be used? In most cases, the mask only needs to be worn during sleep. Generally, the mask needs to be worn throughout the night and during any  daytime naps. People with certain medical conditions may also need to wear the mask at other times when they are awake. Follow instructions from your health care provider about when to use the machine. What are some tips for using the mask?   Because the mask needs to be snug, some people feel trapped or closed-in (claustrophobic) when first using the mask. If you feel this way, you may need to get used to the mask. One way to do this is by holding the mask loosely over your nose or mouth and then gradually applying the mask more snugly. You can also gradually increase the amount of time that you use the mask.  Masks are available in various types and sizes. Some fit over your mouth and nose while others fit over just your nose. If your mask does not fit well, talk with your health care provider about getting a different one.  If you are using a mask that fits over your nose and you tend to breathe through your mouth, a chin strap may be applied to help keep your mouth closed.  The CPAP and BPAP machines have alarms that may sound if the mask comes off or develops a leak.  If you have trouble with the mask, it is very important that you talk with your health care provider about finding a way to make the mask easier to tolerate. Do not stop using the mask. Stopping the use of the mask could have a negative impact on your health. What are some tips for using the  machine?  Place your CPAP or BPAP machine on a secure table or stand near an electrical outlet.  Know where the on/off switch is located on the machine.  Follow instructions from your health care provider about how to set the pressure on your machine and when you should use it.  Do not eat or drink while the CPAP or BPAP machine is on. Food or fluids could get pushed into your lungs by the pressure of the CPAP or BPAP.  Do not smoke. Tobacco smoke residue can damage the machine.  For home use, CPAP and BPAP machines can be rented or  purchased through home health care companies. Many different brands of machines are available. Renting a machine before purchasing may help you find out which particular machine works well for you.  Keep the CPAP or BPAP machine and attachments clean. Ask your health care provider for specific instructions. Get help right away if:  You have redness or open areas around your nose or mouth where the mask fits.  You have trouble using the CPAP or BPAP machine.  You cannot tolerate wearing the CPAP or BPAP mask.  You have pain, discomfort, and bloating in your abdomen. Summary  CPAP and BPAP are methods of helping a person breathe with the use of air pressure.  Both CPAP and BPAP are provided by a small machine with a flexible plastic tube that attaches to a plastic mask.  If you have trouble with the mask, it is very important that you talk with your health care provider about finding a way to make the mask easier to tolerate. This information is not intended to replace advice given to you by your health care provider. Make sure you discuss any questions you have with your health care provider. Document Revised: 02/09/2019 Document Reviewed: 09/08/2016 Elsevier Patient Education  [place].

## 2020-07-10 NOTE — Progress Notes (Signed)
@Patient  ID: Stacey Taylor, female    DOB: 01-31-1981, 39 y.o.   MRN: [id number]  Chief Complaint  Patient presents with  . sleep consult    self referral--currently wearing bipap.pressure and mask is okay. [id number] [facility]    Referring provider: Nobie Putnam *  HPI: 39 year old female, never smoked.  Past medical history significant for obstructive sleep apnea, essential hypertension, chronic back pain, dyspnea on exertion, prediabetes, lymphedema, morbid obesity.  Patient self-referred to [facility] pulmonary for obstructive sleep apnea. PSG 03/31/20 severe OSA, AHI 155/hr requiring BIPAP. Maintained on lasix 40mg  twice daily as needed for leg swelling.   07/10/2020 Patient presents today for self referral. She needs new CPAP machine. Previous sleep study: PSG 03/31/20 severe OSA, AHI 155/hr.  BIPAP titration study recommended 25/12, PS 4. Still complicated by frequent oxygen desaturations. Weight 283 lbs. ResMed N20 small mask. Humidified oxygen. She had an over night oximetry test that did not show nocturnal oxygen desaturation.   Patient reports compliance with BIPAP, improved sleep with use. She had a normal echocardiogram in 2019. She endorses loud snoring. She reports gaining a significant amount of weight in the last year. She had hysterectomy d/t endometriosis in May 2021. During procedure her oxygen saturation was low. After this she had sleep study in June with PCP/ with [facility]. She tells me that she needed oxygen during her study. She is currently wearing Bipap. Data reviewed on her phone which showed average events 3.6 with moderate airleaks. She still reports feeling sluggish and tired. She is using nasal mask. Sleep study was done at [facility], this is also where she reports getting her supplies from.   Sleep questionnaire  Symptoms: Loud snoring, daytime fatigue  Previous sleep study: PSG 03/31/20 severe OSA, AHI 155/hr.  Bed Time: 10-11pm Time to  fall asleep: 5-3mins Wake Time: 7am [place] 10/24  Allergies  Allergen Reactions  . Latex Hives  . Tramadol     Chest pain    Immunization History  Administered Date(s) Administered  . Influenza,inj,Quad PF,6+ Mos 07/30/2017, 07/22/2018, 09/09/2019  . Influenza-Unspecified 08/07/2016    Past Medical History:  Diagnosis Date  . Abnormal Pap smear of anus    HGSIL  . Anemia   . Back pain   . Cervical dysplasia    CIN III  . Endometriosis   . Gallbladder problem   . Headache   . Heart murmur   . Leg edema   . Premature delivery    x2    Tobacco History: Social History   Tobacco Use  Smoking Status Never Smoker  Smokeless Tobacco Never Used   Counseling given: Not Answered   Outpatient Medications Prior to Visit  Medication Sig Dispense Refill  . dicyclomine (BENTYL) 10 MG capsule Take 1 capsule (10 mg total) by mouth 4 (four) times daily -  before meals and at bedtime. As needed 30 capsule 0  . furosemide (LASIX) 40 MG tablet Take 1 tablet (40 mg total) by mouth 2 (two) times daily as needed for fluid or edema. May take a 2nd dose in 24 hours if needed. 60 tablet 3  . ibuprofen (ADVIL) 600 MG tablet Take 1 tablet (600 mg total) by mouth every 6 (six) hours as needed for mild pain or cramping. 40 tablet 1  . loratadine (CLARITIN) 10 MG tablet Take 10 mg by mouth daily.    . potassium chloride (KLOR-CON) 10 MEQ tablet Take 1 tablet (10 mEq total) by mouth daily. [place]  tablet 3   No facility-administered medications prior to visit.    Review of Systems  Review of Systems  Constitutional: Positive for fatigue and unexpected weight change.  Respiratory: Negative for cough, shortness of breath and wheezing.   Cardiovascular: Positive for leg swelling.  Psychiatric/Behavioral: Positive for sleep disturbance.    Physical Exam  BP 124/80 (BP Location: Left Arm, Cuff Size: Large)   Pulse 83   Temp (!) 97.5 F (36.4 C) (Temporal)   Ht 5' (1.524 m)   Wt 272 lb  (123.4 kg)   LMP 01/06/2020   SpO2 100%   BMI 53.12 kg/m  Physical Exam Constitutional:      Appearance: Normal appearance. She is not ill-appearing.  HENT:     Head: Normocephalic and atraumatic.     Mouth/Throat:     Mouth: Mucous membranes are moist.     Pharynx: Oropharynx is clear.  Cardiovascular:     Rate and Rhythm: Normal rate and regular rhythm.     Pulses: Normal pulses.     Heart sounds: Normal heart sounds. No murmur heard.      Comments: +1-2 BLE edema  Pulmonary:     Effort: Pulmonary effort is normal.     Breath sounds: Normal breath sounds. No wheezing.  Musculoskeletal:     Cervical back: Normal range of motion and neck supple.  Skin:    General: Skin is warm and dry.  Neurological:     General: No focal deficit present.     Mental Status: She is alert and oriented to person, place, and time. Mental status is at baseline.  Psychiatric:        Mood and Affect: Mood normal.        Behavior: Behavior normal.        Thought Content: Thought content normal.        Judgment: Judgment normal.      Lab Results:  CBC    Component Value Date/Time   WBC 10.1 03/09/2020 0626   RBC 4.11 03/09/2020 0626   HGB 10.9 (L) 03/09/2020 0626   HGB 12.2 12/10/2017 1117   HCT 35.5 (L) 03/09/2020 0626   HCT 37.0 12/10/2017 1117   PLT 399 03/09/2020 0626   PLT 312 11/09/2013 1059   MCV 86.4 03/09/2020 0626   MCV 85 12/10/2017 1117   MCV 86 11/09/2013 1059   MCH 26.5 03/09/2020 0626   MCHC 30.7 03/09/2020 0626   RDW 14.8 03/09/2020 0626   RDW 13.5 12/10/2017 1117   RDW 14.0 11/09/2013 1059   LYMPHSABS 2,146 07/08/2018 0827   LYMPHSABS 2.5 12/10/2017 1117   LYMPHSABS 1.9 11/09/2013 1059   MONOABS 0.6 11/09/2013 1059   EOSABS 81 07/08/2018 0827   EOSABS 0.1 12/10/2017 1117   EOSABS 0.0 11/09/2013 1059   BASOSABS 30 07/08/2018 0827   BASOSABS 0.0 12/10/2017 1117   BASOSABS 0.0 11/09/2013 1059    BMET    Component Value Date/Time   NA 138 03/09/2020 0626    NA 140 12/10/2017 1117   NA 139 01/14/2013 0150   K 3.5 03/09/2020 0626   K 3.6 01/14/2013 0150   CL 100 03/09/2020 0626   CL 107 01/14/2013 0150   CO2 30 03/09/2020 0626   CO2 23 01/14/2013 0150   GLUCOSE 123 (H) 03/09/2020 0626   GLUCOSE 105 (H) 01/14/2013 0150   BUN 9 03/09/2020 0626   BUN 8 12/10/2017 1117   BUN 10 01/14/2013 0150   CREATININE 0.64 03/09/2020 2025  CREATININE 0.67 07/08/2018 0827   CALCIUM 8.4 (L) 03/09/2020 0626   CALCIUM 8.6 01/14/2013 0150   GFRNONAA >60 03/09/2020 0626   GFRNONAA 113 07/08/2018 0827   GFRAA >60 03/09/2020 0626   GFRAA 131 07/08/2018 0827    BNP    Component Value Date/Time   BNP 18 07/23/2017 0903    ProBNP No results found for: PROBNP  Imaging: No results found.   Assessment & Plan:   OSA (obstructive sleep apnea) - Patient has hx severe obstructive sleep apnea; Split-night sleep study in May 2021 >> AHI 155/hr, completed titration study with continued episodes of oxygen desaturation despite PAP therapy. ONO with PCP appears to have shown no evidence of oxygen desaturation <88%. Patient does not feel this was accurate test. Continues to feel sluggish and tired with symptoms or morning headache/dizziness.  - Needs repeat BiPAP titration study Re: severe obstructive sleep apnea/nocturnal hypoxia  - Continue BIPAP at current settings; EPAP min 12, Max IPAP 25, PS 4 - Follow-up in 6-8 weeks with Dr. Halford Chessman (new sleep patient)       Martyn Ehrich, NP 07/11/2020

## 2020-07-11 ENCOUNTER — Encounter: Payer: Self-pay | Admitting: Primary Care

## 2020-07-11 NOTE — Progress Notes (Signed)
Reviewed and agree with assessment/plan.   Chesley Mires, MD [facility] 07/11/2020, 1:07 PM Pager:  [phone]

## 2020-07-11 NOTE — Assessment & Plan Note (Signed)
-   Patient has hx severe obstructive sleep apnea; Split-night sleep study in May 2021 >> AHI 155/hr, completed titration study with continued episodes of oxygen desaturation despite PAP therapy. ONO with PCP appears to have shown no evidence of oxygen desaturation <88%. Patient does not feel this was accurate test. Continues to feel sluggish and tired with symptoms or morning headache/dizziness.  - Needs repeat BiPAP titration study Re: severe obstructive sleep apnea/nocturnal hypoxia  - Continue BIPAP at current settings; EPAP min 12, Max IPAP 25, PS 4 - Follow-up in 6-8 weeks with Dr. Halford Chessman (new sleep patient)

## 2020-07-21 DIAGNOSIS — G4733 Obstructive sleep apnea (adult) (pediatric): Secondary | ICD-10-CM | POA: Diagnosis not present

## 2020-07-24 DIAGNOSIS — M25561 Pain in right knee: Secondary | ICD-10-CM | POA: Diagnosis not present

## 2020-07-25 DIAGNOSIS — M25561 Pain in right knee: Secondary | ICD-10-CM | POA: Diagnosis not present

## 2020-08-07 DIAGNOSIS — Z20822 Contact with and (suspected) exposure to covid-19: Secondary | ICD-10-CM | POA: Diagnosis not present

## 2020-08-07 DIAGNOSIS — Z03818 Encounter for observation for suspected exposure to other biological agents ruled out: Secondary | ICD-10-CM | POA: Diagnosis not present

## 2020-08-07 DIAGNOSIS — R519 Headache, unspecified: Secondary | ICD-10-CM | POA: Diagnosis not present

## 2020-08-08 DIAGNOSIS — M25561 Pain in right knee: Secondary | ICD-10-CM | POA: Diagnosis not present

## 2020-08-09 DIAGNOSIS — M25561 Pain in right knee: Secondary | ICD-10-CM | POA: Diagnosis not present

## 2020-08-20 DIAGNOSIS — G4733 Obstructive sleep apnea (adult) (pediatric): Secondary | ICD-10-CM | POA: Diagnosis not present

## 2020-08-21 DIAGNOSIS — H5213 Myopia, bilateral: Secondary | ICD-10-CM | POA: Diagnosis not present

## 2020-08-21 DIAGNOSIS — H04123 Dry eye syndrome of bilateral lacrimal glands: Secondary | ICD-10-CM | POA: Diagnosis not present

## 2020-08-23 DIAGNOSIS — M25561 Pain in right knee: Secondary | ICD-10-CM | POA: Diagnosis not present

## 2020-08-24 DIAGNOSIS — M25561 Pain in right knee: Secondary | ICD-10-CM | POA: Diagnosis not present

## 2020-08-28 ENCOUNTER — Ambulatory Visit: Payer: 59 | Attending: Pulmonary Disease

## 2020-08-30 ENCOUNTER — Ambulatory Visit: Payer: 59

## 2020-08-31 ENCOUNTER — Ambulatory Visit (INDEPENDENT_AMBULATORY_CARE_PROVIDER_SITE_OTHER): Payer: 59

## 2020-08-31 ENCOUNTER — Other Ambulatory Visit: Payer: Self-pay

## 2020-08-31 DIAGNOSIS — Z23 Encounter for immunization: Secondary | ICD-10-CM | POA: Diagnosis not present

## 2020-09-01 DIAGNOSIS — I10 Essential (primary) hypertension: Secondary | ICD-10-CM | POA: Diagnosis not present

## 2020-09-01 DIAGNOSIS — M79671 Pain in right foot: Secondary | ICD-10-CM | POA: Diagnosis not present

## 2020-09-01 DIAGNOSIS — G4733 Obstructive sleep apnea (adult) (pediatric): Secondary | ICD-10-CM | POA: Diagnosis not present

## 2020-09-01 DIAGNOSIS — S90861A Insect bite (nonvenomous), right foot, initial encounter: Secondary | ICD-10-CM | POA: Diagnosis not present

## 2020-09-01 DIAGNOSIS — M1990 Unspecified osteoarthritis, unspecified site: Secondary | ICD-10-CM | POA: Diagnosis not present

## 2020-09-01 DIAGNOSIS — M7989 Other specified soft tissue disorders: Secondary | ICD-10-CM | POA: Diagnosis not present

## 2020-09-03 DIAGNOSIS — M9904 Segmental and somatic dysfunction of sacral region: Secondary | ICD-10-CM | POA: Diagnosis not present

## 2020-09-03 DIAGNOSIS — M5127 Other intervertebral disc displacement, lumbosacral region: Secondary | ICD-10-CM | POA: Diagnosis not present

## 2020-09-03 DIAGNOSIS — M4607 Spinal enthesopathy, lumbosacral region: Secondary | ICD-10-CM | POA: Diagnosis not present

## 2020-09-03 DIAGNOSIS — M9903 Segmental and somatic dysfunction of lumbar region: Secondary | ICD-10-CM | POA: Diagnosis not present

## 2020-09-04 ENCOUNTER — Other Ambulatory Visit: Payer: Self-pay | Admitting: Family Medicine

## 2020-09-04 ENCOUNTER — Other Ambulatory Visit: Payer: Self-pay

## 2020-09-04 ENCOUNTER — Ambulatory Visit
Admission: RE | Admit: 2020-09-04 | Discharge: 2020-09-04 | Disposition: A | Discharge status: Home / Self Care | Payer: 59 | Source: Ambulatory Visit

## 2020-09-04 VITALS — BP 159/90 | HR 77 | Temp 98.3°F | Resp 18 | Ht 60.0 in | Wt 275.0 lb

## 2020-09-04 DIAGNOSIS — L03115 Cellulitis of right lower limb: Secondary | ICD-10-CM | POA: Diagnosis not present

## 2020-09-04 DIAGNOSIS — M79671 Pain in right foot: Secondary | ICD-10-CM

## 2020-09-04 MED ORDER — MUPIROCIN CALCIUM 2 % EX CREA: 1.0000 "application " | TOPICAL_CREAM | Freq: Two times a day (BID) | CUTANEOUS | 0 refills | Status: DC

## 2020-09-04 MED ORDER — FLUCONAZOLE 150 MG PO TABS
ORAL_TABLET | ORAL | 1 refills | Status: DC
Start: 2020-09-04 — End: 2020-11-07

## 2020-09-04 MED ORDER — DOXYCYCLINE HYCLATE 100 MG PO CAPS: 100.0000 mg | ORAL_CAPSULE | Freq: Two times a day (BID) | ORAL | 0 refills | Status: DC

## 2020-09-04 NOTE — ED Triage Notes (Signed)
Patient complains of right foot insect bites that occurred on Friday. Patient states that she put benadryl and ice on the area. States that she went to [facility] ED on Saturday due to the redness spreading and was given antibiotics Cephalexin 500mg . States that foot has improved some but has not improved.

## 2020-09-04 NOTE — ED Provider Notes (Signed)
[facility]   [id number] 09/04/20 Arrival Time: 0829  CC: RASH  SUBJECTIVE:  Stacey Taylor is a 39 y.o. female who presents with a skin complaint that began about 5 days ago. Reports that she was bitten by a bug to the medial aspect of the R foot. Was seen in the ER x 4 days ago. Was treated with Keflex and has had about 5 doses with minimal benefit. Denies precipitating event or trauma. Reports that the area is red, swollen and painful. There are no aggravating or alleviating factors. Denies similar symptoms in the past. Denies fever, chills, nausea, vomiting, discharge, oral lesions, SOB, chest pain, abdominal pain, changes in bowel or bladder function.    ROS: As per HPI.  All other pertinent ROS negative.     Past Medical History:  Diagnosis Date  . Abnormal Pap smear of anus    HGSIL  . Anemia   . Back pain   . Cervical dysplasia    CIN III  . Endometriosis   . Gallbladder problem   . Headache   . Heart murmur   . Leg edema   . Premature delivery    x2   Past Surgical History:  Procedure Laterality Date  . CERVICAL BIOPSY  W/ LOOP ELECTRODE EXCISION  11/2005  . CESAREAN SECTION  2008/2015  . CESAREAN SECTION WITH BILATERAL TUBAL LIGATION  2015  . CHOLECYSTECTOMY  2003  . COLPOSCOPY  2005, 2006  . COMBINED HYSTEROSCOPY DIAGNOSTIC / D&C  2014  . CYSTOSCOPY N/A 03/08/2020   Procedure: CYSTOSCOPY;  Surgeon: Malachy Mood, MD;  Location: [facility] ORS;  Service: Gynecology;  Laterality: N/A;  . LAPAROSCOPIC SUPRACERVICAL HYSTERECTOMY N/A 03/08/2020   Procedure: LAPAROSCOPIC SUPRACERVICAL HYSTERECTOMY WITH BILATERAL SALPINGECTOMY;  Surgeon: Malachy Mood, MD;  Location: [facility] ORS;  Service: Gynecology;  Laterality: N/A;   Allergies  Allergen Reactions  . Latex Hives  . Tramadol     Chest pain   No current facility-administered medications on file prior to encounter.   Current Outpatient Medications on File Prior to Encounter  Medication Sig Dispense Refill    . dicyclomine (BENTYL) 10 MG capsule Take 1 capsule (10 mg total) by mouth 4 (four) times daily -  before meals and at bedtime. As needed 30 capsule 0  . furosemide (LASIX) 40 MG tablet Take 1 tablet (40 mg total) by mouth 2 (two) times daily as needed for fluid or edema. May take a 2nd dose in 24 hours if needed. 60 tablet 3  . potassium chloride (KLOR-CON) 10 MEQ tablet Take 1 tablet (10 mEq total) by mouth daily. 30 tablet 3  . [DISCONTINUED] loratadine (CLARITIN) 10 MG tablet Take 10 mg by mouth daily.     Social History   Socioeconomic History  . Marital status: Married    Spouse name: Christia Reading  . Number of children: 5  . Years of education: Not on file  . Highest education level: Not on file  Occupational History  . Occupation: CNA  Tobacco Use  . Smoking status: Never Smoker  . Smokeless tobacco: Never Used  Vaping Use  . Vaping Use: Never used  Substance and Sexual Activity  . Alcohol use: No    Alcohol/week: 0.0 standard drinks  . Drug use: No  . Sexual activity: Not Currently    Birth control/protection: Pill, Surgical    Comment: Tubal ligation  Other Topics Concern  . Not on file  Social History Narrative  . Not on file   Social  Determinants of Health   Financial Resource Strain:   . Difficulty of Paying Living Expenses: Not on file  Food Insecurity:   . Worried About Charity fundraiser in the Last Year: Not on file  . Ran Out of Food in the Last Year: Not on file  Transportation Needs:   . Lack of Transportation (Medical): Not on file  . Lack of Transportation (Non-Medical): Not on file  Physical Activity:   . Days of Exercise per Week: Not on file  . Minutes of Exercise per Session: Not on file  Stress:   . Feeling of Stress : Not on file  Social Connections:   . Frequency of Communication with Friends and Family: Not on file  . Frequency of Social Gatherings with Friends and Family: Not on file  . Attends Religious Services: Not on file  . Active  Member of Clubs or Organizations: Not on file  . Attends Archivist Meetings: Not on file  . Marital Status: Not on file  Intimate Partner Violence:   . Fear of Current or Ex-Partner: Not on file  . Emotionally Abused: Not on file  . Physically Abused: Not on file  . Sexually Abused: Not on file   Family History  Adopted: Yes  Family history unknown: Yes    OBJECTIVE: Vitals:   09/04/20 0859 09/04/20 0902  BP:  (!) 159/90  Pulse:  77  Resp:  18  Temp:  98.3 F (36.8 C)  TempSrc:  Oral  SpO2:  99%  Weight: 275 lb (124.7 kg)   Height: 5' (1.524 m)     General appearance: alert; no distress Head: NCAT Lungs: clear to auscultation bilaterally Heart: regular rate and rhythm.  Radial pulse 2+ bilaterally Extremities: no edema Skin: warm and dry; medial aspect of R foot erythematous about 2 cm in diameter with central lesion, warm, tender to touch, no drainage. Erythema also extends across the top of the foot Psychological: alert and cooperative; normal mood and affect  ASSESSMENT & PLAN:  1. Cellulitis of right lower extremity   2. Right foot pain     Meds ordered this encounter  Medications  . doxycycline (VIBRAMYCIN) 100 MG capsule    Sig: Take 1 capsule (100 mg total) by mouth 2 (two) times daily.    Dispense:  20 capsule    Refill:  0    Order Specific Question:   Supervising Provider    Answer:   Chase Picket [id number]  . fluconazole (DIFLUCAN) 150 MG tablet    Sig: Take one tablet at the onset of symptoms, if still having symptoms in 3 days, take the second tablet.    Dispense:  2 tablet    Refill:  1    Order Specific Question:   Supervising Provider    Answer:   Chase Picket [id number]  . mupirocin cream (BACTROBAN) 2 %    Sig: Apply 1 application topically 2 (two) times daily.    Dispense:  15 g    Refill:  0    Order Specific Question:   Supervising Provider    Answer:   Chase Picket [[id number]]    STOP Keflex Prescribed  doxycycline Prescribed Mupirocin Prescribed fluconazole in case of yeast Take as prescribed and to completion Avoid hot showers/ baths Moisturize skin daily  Follow up with PCP if symptoms persists Return or go to the ER if you have any new or worsening symptoms such as fever, chills, nausea, vomiting, redness,  swelling, discharge, if symptoms do not improve with medications  Reviewed expectations re: course of current medical issues. Questions answered. Outlined signs and symptoms indicating need for more acute intervention. Patient verbalized understanding. After Visit Summary given.   Faustino Congress, NP 09/04/20 [phone]

## 2020-09-04 NOTE — Discharge Instructions (Addendum)
I have sent in doxycycline twice a day for 10 days  I have sent in mupirocin ointment for you to use twice a day as needed  I have sent in fluconazole in case of yeast. Take one tablet at the onset of symptoms. If symptoms are still present in 3 days, take the second tablet. I have placed one refill for this because of the length of the antibiotics.  Follow up with this office or with primary care for increased swelling, redness, tenderness, warmth, drainage from the area.  Follow up in the ER for red streaking up your leg, high fever, trouble swallowing, trouble breathing, other concerning symptoms.

## 2020-09-05 DIAGNOSIS — M5127 Other intervertebral disc displacement, lumbosacral region: Secondary | ICD-10-CM | POA: Diagnosis not present

## 2020-09-05 DIAGNOSIS — M4607 Spinal enthesopathy, lumbosacral region: Secondary | ICD-10-CM | POA: Diagnosis not present

## 2020-09-05 DIAGNOSIS — M9903 Segmental and somatic dysfunction of lumbar region: Secondary | ICD-10-CM | POA: Diagnosis not present

## 2020-09-05 DIAGNOSIS — M9904 Segmental and somatic dysfunction of sacral region: Secondary | ICD-10-CM | POA: Diagnosis not present

## 2020-09-07 DIAGNOSIS — M9903 Segmental and somatic dysfunction of lumbar region: Secondary | ICD-10-CM | POA: Diagnosis not present

## 2020-09-07 DIAGNOSIS — M5127 Other intervertebral disc displacement, lumbosacral region: Secondary | ICD-10-CM | POA: Diagnosis not present

## 2020-09-07 DIAGNOSIS — M4607 Spinal enthesopathy, lumbosacral region: Secondary | ICD-10-CM | POA: Diagnosis not present

## 2020-09-07 DIAGNOSIS — M9904 Segmental and somatic dysfunction of sacral region: Secondary | ICD-10-CM | POA: Diagnosis not present

## 2020-09-08 DIAGNOSIS — M25561 Pain in right knee: Secondary | ICD-10-CM | POA: Diagnosis not present

## 2020-09-09 DIAGNOSIS — M25561 Pain in right knee: Secondary | ICD-10-CM | POA: Diagnosis not present

## 2020-09-10 DIAGNOSIS — M9903 Segmental and somatic dysfunction of lumbar region: Secondary | ICD-10-CM | POA: Diagnosis not present

## 2020-09-10 DIAGNOSIS — M9904 Segmental and somatic dysfunction of sacral region: Secondary | ICD-10-CM | POA: Diagnosis not present

## 2020-09-10 DIAGNOSIS — M4607 Spinal enthesopathy, lumbosacral region: Secondary | ICD-10-CM | POA: Diagnosis not present

## 2020-09-10 DIAGNOSIS — M5127 Other intervertebral disc displacement, lumbosacral region: Secondary | ICD-10-CM | POA: Diagnosis not present

## 2020-09-12 DIAGNOSIS — M4607 Spinal enthesopathy, lumbosacral region: Secondary | ICD-10-CM | POA: Diagnosis not present

## 2020-09-12 DIAGNOSIS — M5127 Other intervertebral disc displacement, lumbosacral region: Secondary | ICD-10-CM | POA: Diagnosis not present

## 2020-09-12 DIAGNOSIS — M9903 Segmental and somatic dysfunction of lumbar region: Secondary | ICD-10-CM | POA: Diagnosis not present

## 2020-09-12 DIAGNOSIS — M9904 Segmental and somatic dysfunction of sacral region: Secondary | ICD-10-CM | POA: Diagnosis not present

## 2020-09-17 DIAGNOSIS — M9903 Segmental and somatic dysfunction of lumbar region: Secondary | ICD-10-CM | POA: Diagnosis not present

## 2020-09-17 DIAGNOSIS — M5127 Other intervertebral disc displacement, lumbosacral region: Secondary | ICD-10-CM | POA: Diagnosis not present

## 2020-09-17 DIAGNOSIS — M4607 Spinal enthesopathy, lumbosacral region: Secondary | ICD-10-CM | POA: Diagnosis not present

## 2020-09-17 DIAGNOSIS — M9904 Segmental and somatic dysfunction of sacral region: Secondary | ICD-10-CM | POA: Diagnosis not present

## 2020-09-20 DIAGNOSIS — G4733 Obstructive sleep apnea (adult) (pediatric): Secondary | ICD-10-CM | POA: Diagnosis not present

## 2020-09-23 DIAGNOSIS — M25561 Pain in right knee: Secondary | ICD-10-CM | POA: Diagnosis not present

## 2020-09-24 DIAGNOSIS — M25561 Pain in right knee: Secondary | ICD-10-CM | POA: Diagnosis not present

## 2020-09-25 ENCOUNTER — Encounter: Payer: Self-pay | Admitting: Primary Care

## 2020-09-25 ENCOUNTER — Ambulatory Visit: Payer: 59 | Attending: Pulmonary Disease

## 2020-09-25 DIAGNOSIS — M9904 Segmental and somatic dysfunction of sacral region: Secondary | ICD-10-CM | POA: Diagnosis not present

## 2020-09-25 DIAGNOSIS — M5127 Other intervertebral disc displacement, lumbosacral region: Secondary | ICD-10-CM | POA: Diagnosis not present

## 2020-09-25 DIAGNOSIS — G4733 Obstructive sleep apnea (adult) (pediatric): Secondary | ICD-10-CM | POA: Diagnosis not present

## 2020-09-25 DIAGNOSIS — M4607 Spinal enthesopathy, lumbosacral region: Secondary | ICD-10-CM | POA: Diagnosis not present

## 2020-09-25 DIAGNOSIS — G4734 Idiopathic sleep related nonobstructive alveolar hypoventilation: Secondary | ICD-10-CM | POA: Insufficient documentation

## 2020-09-25 DIAGNOSIS — M9903 Segmental and somatic dysfunction of lumbar region: Secondary | ICD-10-CM | POA: Diagnosis not present

## 2020-09-26 ENCOUNTER — Other Ambulatory Visit: Payer: Self-pay

## 2020-10-01 ENCOUNTER — Telehealth (HOSPITAL_BASED_OUTPATIENT_CLINIC_OR_DEPARTMENT_OTHER): Payer: 59 | Admitting: Pulmonary Disease

## 2020-10-01 DIAGNOSIS — G4733 Obstructive sleep apnea (adult) (pediatric): Secondary | ICD-10-CM

## 2020-10-01 NOTE — Telephone Encounter (Signed)
Recommend BiPAP 18/11 or auto bipap that she is currently on No oxygen required

## 2020-10-01 NOTE — Telephone Encounter (Signed)
Please let patient know that bipap titration study showed that she does not need to use oxygen with BIPAP.  Optimal pressure setting BIPAP 18/11, please send in new order. She needs an apt with Dr. Halford Chessman, new patient, in [place] in 2-3 months.

## 2020-10-02 DIAGNOSIS — M4607 Spinal enthesopathy, lumbosacral region: Secondary | ICD-10-CM | POA: Diagnosis not present

## 2020-10-02 DIAGNOSIS — M9904 Segmental and somatic dysfunction of sacral region: Secondary | ICD-10-CM | POA: Diagnosis not present

## 2020-10-02 DIAGNOSIS — M5127 Other intervertebral disc displacement, lumbosacral region: Secondary | ICD-10-CM | POA: Diagnosis not present

## 2020-10-02 DIAGNOSIS — M9903 Segmental and somatic dysfunction of lumbar region: Secondary | ICD-10-CM | POA: Diagnosis not present

## 2020-10-02 NOTE — Telephone Encounter (Signed)
Called and spoke with pt letting her know the results of the BIPAP titration study and recommendations per [facility]. After stating the info to pt, pt wanted to have a visit scheduled to further discuss results with provider prior to order being placed. televisit has been scheduled for pt tomorrow 12/1 with [facility]. Nothing further needed.

## 2020-10-03 ENCOUNTER — Other Ambulatory Visit: Payer: Self-pay

## 2020-10-03 ENCOUNTER — Ambulatory Visit (INDEPENDENT_AMBULATORY_CARE_PROVIDER_SITE_OTHER): Payer: 59 | Admitting: Primary Care

## 2020-10-03 ENCOUNTER — Encounter: Payer: Self-pay | Admitting: Primary Care

## 2020-10-03 ENCOUNTER — Encounter: Payer: 59 | Admitting: Primary Care

## 2020-10-03 DIAGNOSIS — G4733 Obstructive sleep apnea (adult) (pediatric): Secondary | ICD-10-CM

## 2020-10-03 DIAGNOSIS — R011 Cardiac murmur, unspecified: Secondary | ICD-10-CM | POA: Diagnosis not present

## 2020-10-03 DIAGNOSIS — R5383 Other fatigue: Secondary | ICD-10-CM | POA: Diagnosis not present

## 2020-10-03 NOTE — Progress Notes (Signed)
Virtual Visit via Telephone Note  I connected with [place] on 10/03/20 at  3:30 PM EST by telephone and verified that I am speaking with the correct person using two identifiers.  Location: Patient: Home Provider: Office   I discussed the limitations, risks, security and privacy concerns of performing an evaluation and management service by telephone and the availability of in person appointments. I also discussed with the patient that there may be a patient responsible charge related to this service. The patient expressed understanding and agreed to proceed.   History of Present Illness: 39 year old female, never smoked.  Past medical history significant for obstructive sleep apnea, essential hypertension, chronic back pain, dyspnea on exertion, prediabetes, lymphedema, morbid obesity.  Patient self-referred to [facility] pulmonary for obstructive sleep apnea. PSG 03/31/20 severe OSA, AHI 155/hr requiring BIPAP. Maintained on lasix 40mg  twice daily as needed for leg swelling.   Previous [facility] pulmomary encounter: 07/10/2020 Patient presents today for self referral. She needs new CPAP machine. Previous sleep study: PSG 03/31/20 severe OSA, AHI 155/hr.  BIPAP titration study recommended 25/12, PS 4. Complicated by frequent oxygen desaturations. Weight 283 lbs. ResMed N20 small mask. Humidified oxygen. She had an over night oximetry test that did not show nocturnal oxygen desaturation.   Patient reports compliance with BIPAP, improved sleep with use. She had a normal echocardiogram in 2019. She endorses loud snoring. She reports gaining a significant amount of weight in the last year. She had hysterectomy d/t endometriosis in May 2021. During procedure her oxygen saturation was low. After this she had sleep study in June with PCP/ with [facility]. She tells me that she needed oxygen during her study. She is currently wearing Bipap. Data reviewed on her phone which showed average events  3.6 with moderate airleaks. She still reports feeling sluggish and tired. She is using nasal mask. Sleep study was done at [facility], this is also where she reports getting her supplies from.   Sleep questionnaire  Symptoms: Loud snoring, daytime fatigue  Previous sleep study: PSG 03/31/20 severe OSA, AHI 155/hr.  Bed Time: 10-11pm Time to fall asleep: 5-87mins Wake Time: 7am [place] 10/24  10/03/2020- Interim hx Patient contacted today to review titration study.  BiPAP titration study completed on 09/25/2020.  AHI was 6.6 an hour.  He had 17 obstructive events, 21 hypopneas, 1 central events, 0 mixed events.  Basal oxygen saturation was 97% with SPO2 low 83%.  Time below 88% was 1.1 minutes. Recommend BiPAP therapy with pressure setting of 18/11cm H2O. She did not require supplemental oxygen. Reviewed titration study with her. She was concerned because she was told she had to wear full face mask, she had a very difficult time tolerating this and feels her study may not have been accurate. She feels strongly about staying on nasal nasal pillow mask. Discussed adding chin strap. She continue to complain of fatigue despite BIPAP use. She has a hx cardiac murmur. Her last echocardiogram was in 2019.     Observations/Objective:  - Able to speak in full sentences, no overt shortness of breath or wheezing  Assessment and Plan:  Severe obstructive sleep apnea: - PSG 03/31/20 severe OSA, AHI 155/hr requiring BIPAP 58/09, complicated by frequent oxygen desaturations. She had an overnight oximetry test that did not show nocturnal oxygen desaturation.  - Repeat BIPAP titration study on 09/25/20, AHI 6.6. Recommended BiPAP therapy with pressure setting 18/11cm h20. She did not require oxygen use.  - Patient is tolerating nasal  pillow mask, recommend adding chin strap  - Continue to encourage weight loss efforts, avoid driving while feeling drowsy  Heart murmur: - Complains of fatigue and DOE -  Repeat echocardiogram   Follow Up Instructions:  - FU in 6-8 weeks for compliance check/download    I discussed the assessment and treatment plan with the patient. The patient was provided an opportunity to ask questions and all were answered. The patient agreed with the plan and demonstrated an understanding of the instructions.   The patient was advised to call back or seek an in-person evaluation if the symptoms worsen or if the condition fails to improve as anticipated.  I provided 30 minutes of non-face-to-face time during this encounter.   Martyn Ehrich, NP

## 2020-10-03 NOTE — Progress Notes (Signed)
Reviewed and agree with assessment/plan.   Chesley Mires, MD [facility] Pulmonary/Critical Care 10/03/2020, 5:08 PM Pager:  [phone]

## 2020-10-03 NOTE — Patient Instructions (Signed)
  Recommendations: - Please wear Chin strap with nasal mask - Aim to wear CPAP for 4-6 hours or more - Work on weight loss efforts - Do not drive if drowsy   Orders: - Change BIPAP pressure 18/11 cm h20 - Add chin strap   Follow-up - 6-8 weeks compliance check with Beth NP in [place]

## 2020-10-03 NOTE — Progress Notes (Signed)
 Err

## 2020-10-05 ENCOUNTER — Ambulatory Visit (INDEPENDENT_AMBULATORY_CARE_PROVIDER_SITE_OTHER): Payer: 59

## 2020-10-05 ENCOUNTER — Other Ambulatory Visit: Payer: Self-pay

## 2020-10-05 DIAGNOSIS — R011 Cardiac murmur, unspecified: Secondary | ICD-10-CM | POA: Diagnosis not present

## 2020-10-05 DIAGNOSIS — R5383 Other fatigue: Secondary | ICD-10-CM

## 2020-10-05 LAB — ECHOCARDIOGRAM COMPLETE
AR max vel: 2.33 cm2
AV Area VTI: 2.07 cm2
AV Area mean vel: 1.96 cm2
AV Mean grad: 6 mmHg
AV Peak grad: 12.1 mmHg
Ao pk vel: 1.74 m/s
Area-P 1/2: 2.92 cm2
S' Lateral: 2.1 cm

## 2020-10-05 MED ORDER — PERFLUTREN LIPID MICROSPHERE
1.0000 mL | INTRAVENOUS | Status: AC | PRN
  Administered 2020-10-05: 2 mL via INTRAVENOUS

## 2020-10-08 DIAGNOSIS — M25561 Pain in right knee: Secondary | ICD-10-CM | POA: Diagnosis not present

## 2020-10-08 NOTE — Progress Notes (Signed)
Please let patient know Echo showed normal cardiac output, grade 2 diastolic dysfunction (impaired relaxation). If she doesn't have  cardiologist we can refer her

## 2020-10-09 DIAGNOSIS — M25561 Pain in right knee: Secondary | ICD-10-CM | POA: Diagnosis not present

## 2020-10-11 ENCOUNTER — Telehealth: Payer: Self-pay | Admitting: Primary Care

## 2020-10-11 NOTE — Telephone Encounter (Signed)
Spoke to [employer] with [facility] via telephone.  Stacey Taylor is requesting OV note and echo reports. She is aware that this can be reviewed in care everywhere. She voiced her understanding and had no further questions.  Nothing further needed.

## 2020-10-12 DIAGNOSIS — R0602 Shortness of breath: Secondary | ICD-10-CM | POA: Diagnosis not present

## 2020-10-12 DIAGNOSIS — R002 Palpitations: Secondary | ICD-10-CM | POA: Diagnosis not present

## 2020-10-12 NOTE — Progress Notes (Signed)
Yes that is fine, thank you

## 2020-10-20 DIAGNOSIS — G4733 Obstructive sleep apnea (adult) (pediatric): Secondary | ICD-10-CM | POA: Diagnosis not present

## 2020-10-23 DIAGNOSIS — M25561 Pain in right knee: Secondary | ICD-10-CM | POA: Diagnosis not present

## 2020-10-24 DIAGNOSIS — M25561 Pain in right knee: Secondary | ICD-10-CM | POA: Diagnosis not present

## 2020-10-25 ENCOUNTER — Other Ambulatory Visit: Payer: 59

## 2020-10-31 DIAGNOSIS — R002 Palpitations: Secondary | ICD-10-CM | POA: Diagnosis not present

## 2020-11-07 ENCOUNTER — Ambulatory Visit (INDEPENDENT_AMBULATORY_CARE_PROVIDER_SITE_OTHER): Payer: 59 | Admitting: Primary Care

## 2020-11-07 ENCOUNTER — Encounter: Payer: Self-pay | Admitting: Primary Care

## 2020-11-07 ENCOUNTER — Other Ambulatory Visit: Payer: Self-pay

## 2020-11-07 DIAGNOSIS — G4733 Obstructive sleep apnea (adult) (pediatric): Secondary | ICD-10-CM | POA: Diagnosis not present

## 2020-11-07 DIAGNOSIS — I5189 Other ill-defined heart diseases: Secondary | ICD-10-CM

## 2020-11-07 NOTE — Progress Notes (Signed)
@Patient  ID: Stacey Taylor, female    DOB: [birth year], 40 y.o.   MRN: [id number]  Chief Complaint  Patient presents with  . Follow-up    Wearing cpap avg 6hr nightly. Feels pressure and mask(nasal pillow) is okay.    Referring provider: Nobie Putnam *  HPI: 40 year old female, never smoked.  Past medical history significant for obstructive sleep apnea, essential hypertension, chronic back pain, dyspnea on exertion, prediabetes, lymphedema, morbid obesity.  Patient self-referred to [facility] pulmonary for obstructive sleep apnea. PSG 03/31/20 severe OSA, AHI 155/hr requiring BIPAP. Maintained on lasix 40mg  twice daily as needed for leg swelling.   Previous [facility] pulmomary encounter: 07/10/2020- Sleep consult Patient presents today for self referral. She needs new CPAP machine. Previous sleep study: PSG 03/31/20 severe OSA, AHI 155/hr.  BIPAP titration study recommended 25/12, PS 4. Complicated by frequent oxygen desaturations. Weight 283 lbs. ResMed N20 small mask. Humidified oxygen. She had an over night oximetry test that did not show nocturnal oxygen desaturation.   Patient reports compliance with BIPAP, improved sleep with use. She had a normal echocardiogram in 2019. She endorses loud snoring. She reports gaining a significant amount of weight in the last year. She had hysterectomy d/t endometriosis in May 2021. During procedure her oxygen saturation was low. After this she had sleep study in June with PCP/ with [facility]. She tells me that she needed oxygen during her study. She is currently wearing Bipap. Data reviewed on her phone which showed average events 3.6 with moderate airleaks. She still reports feeling sluggish and tired. She is using nasal mask. Sleep study was done at [facility], this is also where she reports getting her supplies from.   Sleep questionnaire  Symptoms: Loud snoring, daytime fatigue  Previous sleep study: PSG 03/31/20 severe OSA, AHI  155/hr.  Bed Time: 10-11pm Time to fall asleep: 5-48mins Wake Time: 7am [place] 10/24  10/03/2020 Patient contacted today to review titration study.  BiPAP titration study completed on 09/25/2020.  AHI was 6.6 an hour.  He had 17 obstructive events, 21 hypopneas, 1 central events, 0 mixed events.  Basal oxygen saturation was 97% with SPO2 low 83%.  Time below 88% was 1.1 minutes. Recommend BiPAP therapy with pressure setting of 18/11cm H2O. She did not require supplemental oxygen. Reviewed titration study with her. She was concerned because she was told she had to wear full face mask, she had a very difficult time tolerating this and feels her study may not have been accurate. She feels strongly about staying on nasal nasal pillow mask. Discussed adding chin strap. She continue to complain of fatigue despite BIPAP use. She has a hx cardiac murmur. Her last echocardiogram was in 2019.   11/07/2020- Interim hx  Patient presents today for 1 month follow-up. She had a titration study on 09/25/20 that showed she did not require oxygen. During last virtual visit we changed BIPAP setting to recommended 18/11cm h20 and added a chin strap to nasal mask. Echocardiogram was obtained d/t patient reports or fatigue and DOE. Echo showed normal EF 60-65%, mild LVH and grade 2 DD. She is on lasix 40mg  BID as needed for leg swelling.   She is doing well, no acute complaints. She is compliant with BIPAP. Wearing nasal mask. She was provided with the wrong chin strap and needs to exchange with her DME company. Reports that her water reservoir for machine occasionally runs out. She has an apt with cardiology tomorrow. She has some LE  swelling. Takes lasix when able.   Airview download 10/08/20-11/06/19 29/30 days used; 100% > 4 hours Average usage: 8 hours 39 mins Pressure: IPAP 18; EPAP 11 cm h20 Airleaks: 25L/min (95%) AHI 2.4   Allergies  Allergen Reactions  . Latex Hives  . Tramadol     Chest pain     Immunization History  Administered Date(s) Administered  . Influenza,inj,Quad PF,6+ Mos 07/30/2017, 07/22/2018, 09/09/2019, 08/31/2020  . Influenza-Unspecified 08/07/2016  . Moderna Sars-Covid-2 Vaccination 07/13/2020, 08/16/2020    Past Medical History:  Diagnosis Date  . Abnormal Pap smear of anus    HGSIL  . Anemia   . Back pain   . Cervical dysplasia    CIN III  . Endometriosis   . Gallbladder problem   . Headache   . Heart murmur   . Leg edema   . Premature delivery    x2    Tobacco History: Social History   Tobacco Use  Smoking Status Never Smoker  Smokeless Tobacco Never Used   Counseling given: Not Answered   Outpatient Medications Prior to Visit  Medication Sig Dispense Refill  . furosemide (LASIX) 40 MG tablet Take 1 tablet (40 mg total) by mouth 2 (two) times daily as needed for fluid or edema. May take a 2nd dose in 24 hours if needed. 60 tablet 3  . potassium chloride (KLOR-CON) 10 MEQ tablet Take 1 tablet (10 mEq total) by mouth daily. 30 tablet 3  . fluconazole (DIFLUCAN) 150 MG tablet Take one tablet at the onset of symptoms, if still having symptoms in 3 days, take the second tablet. 2 tablet 1   No facility-administered medications prior to visit.    Review of Systems  Review of Systems  Constitutional: Positive for fatigue.  Respiratory: Negative.   Cardiovascular: Positive for leg swelling.  Psychiatric/Behavioral: Negative for sleep disturbance.   Physical Exam  BP 120/72 (BP Location: Left Arm, Cuff Size: Normal)   Pulse 98   Temp 97.7 F (36.5 C) (Temporal)   Ht 5' (1.524 m)   Wt 269 lb 6.4 oz (122.2 kg)   LMP 01/06/2020   SpO2 100%   BMI 52.61 kg/m  Physical Exam Constitutional:      Appearance: Normal appearance.  HENT:     Mouth/Throat:     Mouth: Mucous membranes are moist.     Pharynx: Oropharynx is clear.  Cardiovascular:     Rate and Rhythm: Normal rate and regular rhythm.  Pulmonary:     Effort: Pulmonary  effort is normal.     Breath sounds: Normal breath sounds.  Musculoskeletal:        General: Normal range of motion.     Cervical back: Normal range of motion and neck supple.  Skin:    General: Skin is warm and dry.  Neurological:     General: No focal deficit present.     Mental Status: She is alert and oriented to person, place, and time. Mental status is at baseline.  Psychiatric:        Mood and Affect: Mood normal.        Behavior: Behavior normal.        Thought Content: Thought content normal.        Judgment: Judgment normal.      Lab Results:  CBC    Component Value Date/Time   WBC 10.1 03/09/2020 0626   RBC 4.11 03/09/2020 0626   HGB 10.9 (L) 03/09/2020 0626   HGB 12.2 12/10/2017 1117  HCT 35.5 (L) 03/09/2020 0626   HCT 37.0 12/10/2017 1117   PLT 399 03/09/2020 0626   PLT 312 11/09/2013 1059   MCV 86.4 03/09/2020 0626   MCV 85 12/10/2017 1117   MCV 86 11/09/2013 1059   MCH 26.5 03/09/2020 0626   MCHC 30.7 03/09/2020 0626   RDW 14.8 03/09/2020 0626   RDW 13.5 12/10/2017 1117   RDW 14.0 11/09/2013 1059   LYMPHSABS 2,146 07/08/2018 0827   LYMPHSABS 2.5 12/10/2017 1117   LYMPHSABS 1.9 11/09/2013 1059   MONOABS 0.6 11/09/2013 1059   EOSABS 81 07/08/2018 0827   EOSABS 0.1 12/10/2017 1117   EOSABS 0.0 11/09/2013 1059   BASOSABS 30 07/08/2018 0827   BASOSABS 0.0 12/10/2017 1117   BASOSABS 0.0 11/09/2013 1059    BMET    Component Value Date/Time   NA 138 03/09/2020 0626   NA 140 12/10/2017 1117   NA 139 01/14/2013 0150   K 3.5 03/09/2020 0626   K 3.6 01/14/2013 0150   CL 100 03/09/2020 0626   CL 107 01/14/2013 0150   CO2 30 03/09/2020 0626   CO2 23 01/14/2013 0150   GLUCOSE 123 (H) 03/09/2020 0626   GLUCOSE 105 (H) 01/14/2013 0150   BUN 9 03/09/2020 0626   BUN 8 12/10/2017 1117   BUN 10 01/14/2013 0150   CREATININE 0.64 03/09/2020 0626   CREATININE 0.67 07/08/2018 0827   CALCIUM 8.4 (L) 03/09/2020 0626   CALCIUM 8.6 01/14/2013 0150   GFRNONAA  >60 03/09/2020 0626   GFRNONAA 113 07/08/2018 0827   GFRAA >60 03/09/2020 0626   GFRAA 131 07/08/2018 0827    BNP    Component Value Date/Time   BNP 18 07/23/2017 0903    ProBNP No results found for: PROBNP  Imaging: No results found.   Assessment & Plan:   OSA (obstructive sleep apnea) - PSG 03/31/20 severe OSA, AHI 155/hr requiring BIPAP. PAP titration study 09/25/20 showed that she did not require oxygen, optimal pressure setting 18/11cm h20 - She is 100% compliant with BIPAP and reports benefit from use - Pressure 18/11cm h20; residual AHI 2.4 - No changes today, continue to encourage patient to wear BIPAP every night for 4-6 hours or longer. Advised not to drive if experiencing excessive daytime fatigue or somnolence - FU in 6 months     Diastolic dysfunction - Patient has complaints of fatigue and dyspnea - Echocardiogram showed normal EF 60-65%, mild LVH, grade 2 DD  - Continue lasix 40mg  BID prn swelling or weight changes  - Has an apt with cardiology tomorrow.    , NP 11/07/2020

## 2020-11-07 NOTE — Patient Instructions (Signed)
Great seeing you today, glad you are doing well on BIPAP  Continue to wear BIPAP every night for 4-6 hours or longer Do not drive if experiencing excessive daytime fatigue or somnolence  Recommend you discuss with cardiology most recent echocardiogram results, specifically mild left ventricular hypertrophy along with grade 2 diastolic dysfunction   Continue to work on weight loss, fully support you looking into gastric bypass or sleeve. Contact PCP for referral, if we can do anything further please let me know   Follow-up: 6 months with [facility] NP or any other APP    CPAP and BPAP Information CPAP and BPAP are methods of helping a person breathe with the use of air pressure. CPAP stands for "continuous positive airway pressure." BPAP stands for "bi-level positive airway pressure." In both methods, air is blown through your nose or mouth and into your air passages to help you breathe well. CPAP and BPAP use different amounts of pressure to blow air. With CPAP, the amount of pressure stays the same while you breathe in and out. With BPAP, the amount of pressure is increased when you breathe in (inhale) so that you can take larger breaths. Your health care provider will recommend whether CPAP or BPAP would be more helpful for you. Why are CPAP and BPAP treatments used? CPAP or BPAP can be helpful if you have:  Sleep apnea.  Chronic obstructive pulmonary disease (COPD).  Heart failure.  Medical conditions that weaken the muscles of the chest including muscular dystrophy, or neurological diseases such as amyotrophic lateral sclerosis (ALS).  Other problems that cause breathing to be weak, abnormal, or difficult. CPAP is most commonly used for obstructive sleep apnea (OSA) to keep the airways from collapsing when the muscles relax during sleep. How is CPAP or BPAP administered? Both CPAP and BPAP are provided by a small machine with a flexible plastic tube that attaches to a plastic mask. You  wear the mask. Air is blown through the mask into your nose or mouth. The amount of pressure that is used to blow the air can be adjusted on the machine. Your health care provider will determine the pressure setting that should be used based on your individual needs. When should CPAP or BPAP be used? In most cases, the mask only needs to be worn during sleep. Generally, the mask needs to be worn throughout the night and during any daytime naps. People with certain medical conditions may also need to wear the mask at other times when they are awake. Follow instructions from your health care provider about when to use the machine. What are some tips for using the mask?   Because the mask needs to be snug, some people feel trapped or closed-in (claustrophobic) when first using the mask. If you feel this way, you may need to get used to the mask. One way to do this is by holding the mask loosely over your nose or mouth and then gradually applying the mask more snugly. You can also gradually increase the amount of time that you use the mask.  Masks are available in various types and sizes. Some fit over your mouth and nose while others fit over just your nose. If your mask does not fit well, talk with your health care provider about getting a different one.  If you are using a mask that fits over your nose and you tend to breathe through your mouth, a chin strap may be applied to help keep your mouth closed.  The CPAP and BPAP machines have alarms that may sound if the mask comes off or develops a leak.  If you have trouble with the mask, it is very important that you talk with your health care provider about finding a way to make the mask easier to tolerate. Do not stop using the mask. Stopping the use of the mask could have a negative impact on your health. What are some tips for using the machine?  Place your CPAP or BPAP machine on a secure table or stand near an electrical outlet.  Know where the  on/off switch is located on the machine.  Follow instructions from your health care provider about how to set the pressure on your machine and when you should use it.  Do not eat or drink while the CPAP or BPAP machine is on. Food or fluids could get pushed into your lungs by the pressure of the CPAP or BPAP.  Do not smoke. Tobacco smoke residue can damage the machine.  For home use, CPAP and BPAP machines can be rented or purchased through home health care companies. Many different brands of machines are available. Renting a machine before purchasing may help you find out which particular machine works well for you.  Keep the CPAP or BPAP machine and attachments clean. Ask your health care provider for specific instructions. Get help right away if:  You have redness or open areas around your nose or mouth where the mask fits.  You have trouble using the CPAP or BPAP machine.  You cannot tolerate wearing the CPAP or BPAP mask.  You have pain, discomfort, and bloating in your abdomen. Summary  CPAP and BPAP are methods of helping a person breathe with the use of air pressure.  Both CPAP and BPAP are provided by a small machine with a flexible plastic tube that attaches to a plastic mask.  If you have trouble with the mask, it is very important that you talk with your health care provider about finding a way to make the mask easier to tolerate. This information is not intended to replace advice given to you by your health care provider. Make sure you discuss any questions you have with your health care provider. Document Revised: 02/09/2019 Document Reviewed: 09/08/2016 Elsevier Patient Education  2020 [employer].

## 2020-11-07 NOTE — Assessment & Plan Note (Signed)
-   PSG 03/31/20 severe OSA, AHI 155/hr requiring BIPAP. PAP titration study 09/25/20 showed that she did not require oxygen, optimal pressure setting 18/11cm h20 - She is 100% compliant with BIPAP and reports benefit from use - Pressure 18/11cm h20; residual AHI 2.4 - No changes today, continue to encourage patient to wear BIPAP every night for 4-6 hours or longer. Advised not to drive if experiencing excessive daytime fatigue or somnolence - FU in 6 months

## 2020-11-07 NOTE — Assessment & Plan Note (Signed)
-   Patient has complaints of fatigue and dyspnea - Echocardiogram showed normal EF 60-65%, mild LVH, grade 2 DD  - Continue lasix 40mg  BID prn swelling or weight changes  - Has an apt with cardiology tomorrow.

## 2020-11-08 DIAGNOSIS — M25561 Pain in right knee: Secondary | ICD-10-CM | POA: Diagnosis not present

## 2020-11-08 DIAGNOSIS — Z23 Encounter for immunization: Secondary | ICD-10-CM | POA: Diagnosis not present

## 2020-11-08 DIAGNOSIS — I491 Atrial premature depolarization: Secondary | ICD-10-CM | POA: Diagnosis not present

## 2020-11-08 DIAGNOSIS — R0602 Shortness of breath: Secondary | ICD-10-CM | POA: Diagnosis not present

## 2020-11-08 DIAGNOSIS — R002 Palpitations: Secondary | ICD-10-CM | POA: Diagnosis not present

## 2020-11-08 NOTE — Progress Notes (Signed)
Reviewed and agree with assessment/plan.   Coralyn Helling, MD [facility] Pulmonary/Critical Care 11/08/2020, 8:45 AM Pager:  [phone]

## 2020-11-09 DIAGNOSIS — M25561 Pain in right knee: Secondary | ICD-10-CM | POA: Diagnosis not present

## 2020-11-20 DIAGNOSIS — G4733 Obstructive sleep apnea (adult) (pediatric): Secondary | ICD-10-CM | POA: Diagnosis not present

## 2020-11-23 DIAGNOSIS — M25561 Pain in right knee: Secondary | ICD-10-CM | POA: Diagnosis not present

## 2020-11-24 DIAGNOSIS — M25561 Pain in right knee: Secondary | ICD-10-CM | POA: Diagnosis not present

## 2020-11-27 ENCOUNTER — Other Ambulatory Visit: Payer: Self-pay | Admitting: Family Medicine

## 2020-11-27 ENCOUNTER — Ambulatory Visit (INDEPENDENT_AMBULATORY_CARE_PROVIDER_SITE_OTHER): Payer: 59 | Admitting: Family Medicine

## 2020-11-27 ENCOUNTER — Other Ambulatory Visit: Payer: Self-pay

## 2020-11-27 ENCOUNTER — Encounter: Payer: Self-pay | Admitting: Family Medicine

## 2020-11-27 VITALS — BP 150/92 | HR 81 | Ht 60.0 in | Wt 271.2 lb

## 2020-11-27 DIAGNOSIS — F411 Generalized anxiety disorder: Secondary | ICD-10-CM

## 2020-11-27 DIAGNOSIS — R002 Palpitations: Secondary | ICD-10-CM | POA: Diagnosis not present

## 2020-11-27 DIAGNOSIS — Z6841 Body Mass Index (BMI) 40.0 and over, adult: Secondary | ICD-10-CM | POA: Diagnosis not present

## 2020-11-27 DIAGNOSIS — F410 Panic disorder [episodic paroxysmal anxiety] without agoraphobia: Secondary | ICD-10-CM | POA: Insufficient documentation

## 2020-11-27 MED ORDER — WEGOVY 0.25 MG/0.5ML ~~LOC~~ SOAJ: 0.2500 mg | SUBCUTANEOUS | 0 refills | Status: DC

## 2020-11-27 MED ORDER — ESCITALOPRAM OXALATE 10 MG PO TABS: 10.0000 mg | ORAL_TABLET | Freq: Every day | ORAL | 2 refills | Status: DC

## 2020-11-27 NOTE — Patient Instructions (Addendum)
Thank you for coming to the office today.  As discussed, it sounds like your symptoms are primarily related to anxiety / adjustment disorder. This is a very common problem and be related to several factors, including life stressors. Start treatment with Escitalopram 5mg , take half tab daily for next 2-4  Weeks inc to 1 whole tab when ready 10mg . As discussed most anxiety medications are also used for mood disorders such as depression, because they work on similar chemicals in your brain. It may take up to 3-4 weeks for the medicine to take full effect and for you to notice a difference, sometimes you may notice it working sooner, otherwise we may need to adjust the dose.  Future therapy if interested.  [facility] Address: [address], [place], [place] [address] [email or web address] Phone: [phone]  [facility] [address], [place] [address] Johnnette Litter P: [phone]  [facility], [place]: [place].   Address: [address], [place], [place] [address] Hours: Open today  9AM-7PM Phone: [phone]  [facility] [address], [place] Address: [address] [place], [place], [place] [address] Phone: [phone]  I believe online or phone - [facility] - (different locations)     Call insurance find cost and coverage of the following  Check which one they prefer. - we can do other sample or order when ready. Can use manufacturer coupon online. Easy sign up.   Ozempic is the other version for diabetes we can use sample if need.   Wegovy (Semaglutide injection) - start 0.25mg  weekly for 4 weeks then increase to 0.5mg  weekly - This one has best benefit of weight loss and reducing Cardiovascular events  Saxenda (Liraglutide) - once DAILY - 3 dose changes 0.6, 1.2 and 1.8, side effects nausea, upset stomach higher on this  one but it is still very effective medicine   Please schedule a Follow-up Appointment to: Return in about 4 weeks (around 12/25/2020) for 4 weeks anxiety GAD, obesity weight loss med.  If you have any other questions or concerns, please feel free to call the office or send a message through [place]. You may also schedule an earlier appointment if necessary.  Additionally, you may be receiving a survey about your experience at our office within a few days to 1 week by e-mail or mail. We value your feedback.  Nobie Putnam, DO [place]

## 2020-11-27 NOTE — Assessment & Plan Note (Addendum)
Suspected new dx GAD now with gradual worsening causing more difficulty functioning at times of stress or provoking situations. Issue with high stress patients/clients at work that contribute to her having panic symptoms and worsening her heart palpitations See GAD score - No prior medications  - No prior dx / Psych / counseling  Plan: 1. Discussion on new diagnosis anxiety, management, complications, likely contributing to panic and  2. Start Escitalopram 5mg  dose (half of 10mg ) daily AM with food, counseling on potential side effects risks, reviewed possible GI intolerance, insomnia (although likely to improve this given anxiety likely source of insomnia), anticipate 4-6 weeks for notable effect, may need titrate dose to 10 to 20 in future 3. Advised recommend therapy / counseling in future 4. Follow-up 4-6 weeks anxiety, med adjust, GAD7/PHQ9  Letter for her employer written today. Patient may benefit from some work restrictions in future based on her anxiety symptoms, and may need FMLA going forward or other work restrictions, seems only certain situations or patient scenario tend to trigger, advised her that ultimately it is up to her and her employer to figure out how that will work, but I can write this initial note and she can look into getting approved medical work restriction if needed.

## 2020-11-27 NOTE — Progress Notes (Signed)
Subjective:    Patient ID: Stacey Taylor, female    DOB: [birth year], 40 y.o.   MRN: [id number]  Stacey Taylor is a 40 y.o. female presenting on 11/27/2020 for Anxiety and Congestive Heart Failure   HPI  Heart Palpitations History of Heart Murmur Generalized Anxiety  Post-2nd COVID Vaccine, she has reported history of palpitations. Last ECHO 2019.  She was following by [facility] Pulmonology for OSA. She was referred to Cardiology at [place], they repeated ECHOcardiogram. Result. Stage 2 diastolic CHF see below. - Today she is concerned about getting lightheaded episodes  Admits components of anxiety and stress contributing to vertigo and dizzy / lightheaded episodes. She admits some discomfort at times in her chest from anxiety when stressed. 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 She admits significant concerns with a client/patient who is hostile, and she said worsening stress from working with particular client. She was advised she may need to go to "Health At Work" to see how serious her condition is. During her shift if she is at a longer shift for several hours.  ---------------------------------------------  Morbid Obesity BMI >52 She is interested in bariatric in future and weight loss. She is interested in short term for GLP1 medication injection   Depression screen [facility] 2/9 03/28/2020 03/13/2020 07/12/2019  Decreased Interest 0 0 0  Down, Depressed, Hopeless 0 0 0  PHQ - 2 Score 0 0 0  Altered sleeping - - -  Tired, decreased energy - - -  Change in appetite - - -  Feeling bad or failure about yourself  - - -  Trouble concentrating - - -  Moving slowly or fidgety/restless - - -  Suicidal thoughts - - -  PHQ-9 Score - - -  Difficult doing work/chores - - -   GAD 7 : Generalized Anxiety Score 11/27/2020  Nervous, Anxious, on Edge 3  Control/stop worrying 3  Worry too much - different things 3  Trouble  relaxing 3  Restless 3  Easily annoyed or irritable 3  Afraid - awful might happen 3  Total GAD 7 Score 21  Anxiety Difficulty Very difficult     Social History   Tobacco Use  . Smoking status: Never Smoker  . Smokeless tobacco: Never Used  Vaping Use  . Vaping Use: Never used  Substance Use Topics  . Alcohol use: No    Alcohol/week: 0.0 standard drinks  . Drug use: No    Review of Systems Per HPI unless specifically indicated above     Objective:    BP (!) 150/92   Pulse 81   Ht 5' (1.524 m)   Wt 271 lb 3.2 oz (123 kg)   LMP 01/06/2020   SpO2 100%   BMI 52.97 kg/m   Wt Readings from Last 3 Encounters:  11/27/20 271 lb 3.2 oz (123 kg)  11/07/20 269 lb 6.4 oz (122.2 kg)  09/04/20 275 lb (124.7 kg)    Physical Exam Vitals and nursing note reviewed.  Constitutional:      General: She is not in acute distress.    Appearance: She is well-developed and well-nourished. She is obese. She is not diaphoretic.     Comments: Well-appearing, comfortable, cooperative  HENT:     Head: Normocephalic and atraumatic.     Mouth/Throat:     Mouth: Oropharynx is clear and moist.  Eyes:     General:  Right eye: No discharge.        Left eye: No discharge.     Conjunctiva/sclera: Conjunctivae normal.  Cardiovascular:     Rate and Rhythm: Normal rate.  Pulmonary:     Effort: Pulmonary effort is normal.  Musculoskeletal:        General: No edema.  Skin:    General: Skin is warm and dry.     Findings: No erythema or rash.  Neurological:     Mental Status: She is alert and oriented to person, place, and time.  Psychiatric:        Mood and Affect: Mood and affect normal.        Behavior: Behavior normal.     Comments: Well groomed, good eye contact, normal speech and thoughts         ECHOCARDIOGRAM REPORT       Patient Name:  Stacey Taylor Date of Exam: 10/05/2020  Medical Rec #: [id number]   Height:    60.0 in  Accession #:  [id number]    Weight:    275.0 lb  Date of Birth: 01/29/1981   BSA:     2.137 m  Patient Age:  64 years    BP:      124/80 mmHg  Patient Gender: F       HR:      94 bpm.  Exam Location: [facility]   Procedure: 2D Echo, Cardiac Doppler, Color Doppler and Intracardiac       Opacification Agent   Indications:  R01.1 Murmur; R53.83 Fatigue    History:    Patient has prior history of Echocardiogram examinations,  most         recent 02/04/2018. Signs/Symptoms:Murmur,         Dizziness/Lightheadedness and Fatigue; Risk         Factors:Hypertension, Sleep Apnea and Non-Smoker. Edema.    Sonographer:  Pilar Jarvis RDMS, RVT, RDCS  Referring Phys: [id number] [place]    1. Left ventricular ejection fraction, by estimation, is 60 to 65%. The  left ventricle has normal function. The left ventricle has no regional  wall motion abnormalities. There is mild left ventricular hypertrophy.  Left ventricular diastolic parameters  are consistent with Grade II diastolic dysfunction (pseudonormalization).  2. Right ventricular systolic function is normal. The right ventricular  size is normal. Tricuspid regurgitation signal is inadequate for assessing  PA pressure.  3. Left atrial size was mildly dilated.  4. There is borderline dilatation of the ascending aorta, measuring 38  mm.   FINDINGS  Left Ventricle: Left ventricular ejection fraction, by estimation, is 60  to 65%. The left ventricle has normal function. The left ventricle has no  regional wall motion abnormalities. Definity contrast agent was given IV  to delineate the left ventricular  endocardial borders. The left ventricular internal cavity size was normal  in size. There is mild left ventricular hypertrophy. Left ventricular  diastolic parameters are consistent with Grade II diastolic dysfunction  (pseudonormalization).   Right Ventricle: The  right ventricular size is normal. No increase in  right ventricular wall thickness. Right ventricular systolic function is  normal. Tricuspid regurgitation signal is inadequate for assessing PA  pressure.   Left Atrium: Left atrial size was mildly dilated.   Right Atrium: Right atrial size was normal in size.   Pericardium: There is no evidence of pericardial effusion.   Mitral Valve: The mitral valve is normal in structure. No evidence of  mitral  valve regurgitation. No evidence of mitral valve stenosis.   Tricuspid Valve: The tricuspid valve is normal in structure. Tricuspid  valve regurgitation is not demonstrated. No evidence of tricuspid  stenosis.   Aortic Valve: The aortic valve is normal in structure. Aortic valve  regurgitation is not visualized. Mild aortic valve sclerosis is present,  with no evidence of aortic valve stenosis. Aortic valve mean gradient  measures 6.0 mmHg. Aortic valve peak  gradient measures 12.1 mmHg. Aortic valve area, by VTI measures 2.07 cm.   Pulmonic Valve: The pulmonic valve was normal in structure. Pulmonic valve  regurgitation is not visualized. No evidence of pulmonic stenosis.   Aorta: The aortic root is normal in size and structure. There is  borderline dilatation of the ascending aorta, measuring 38 mm.   Venous: The inferior vena cava is normal in size with greater than 50%  respiratory variability, suggesting right atrial pressure of 3 mmHg.   IAS/Shunts: No atrial level shunt detected by color flow Doppler.     LEFT VENTRICLE  PLAX 2D  LVIDd:     3.40 cm Diastology  LVIDs:     2.10 cm LV e' medial:  7.18 cm/s  LV PW:     1.30 cm LV E/e' medial: 14.2  LV IVS:    1.40 cm LV e' lateral:  9.68 cm/s  LVOT diam:   2.00 cm LV E/e' lateral: 10.5  LV SV:     63  LV SV Index:  30  LVOT Area:   3.14 cm     RIGHT VENTRICLE  RV S prime:   19.80 cm/s  TAPSE (M-mode): 3.3 cm   LEFT ATRIUM        Index  LA diam:    4.40 cm 2.06 cm/m  LA Vol (A2C):  53.6 ml 25.08 ml/m  LA Vol (A4C):  54.0 ml 25.27 ml/m  LA Biplane Vol: 55.6 ml 26.02 ml/m  AORTIC VALVE  AV Area (Vmax):  2.33 cm  AV Area (Vmean):  1.96 cm  AV Area (VTI):   2.07 cm  AV Vmax:      174.00 cm/s  AV Vmean:     117.000 cm/s  AV VTI:      0.307 m  AV Peak Grad:   12.1 mmHg  AV Mean Grad:   6.0 mmHg  LVOT Vmax:     129.00 cm/s  LVOT Vmean:    72.900 cm/s  LVOT VTI:     0.202 m  LVOT/AV VTI ratio: 0.66    AORTA  Ao Root diam: 2.90 cm  Ao Asc diam: 3.80 cm  Ao Arch diam: 2.7 cm   MITRAL VALVE  MV Area (PHT): 2.92 cm   SHUNTS  MV Decel Time: 260 msec   Systemic VTI: 0.20 m  MV E velocity: 102.00 cm/s Systemic Diam: 2.00 cm  MV A velocity: 95.10 cm/s  MV E/A ratio: 1.07   Ida Rogue MD  Electronically signed by Ida Rogue MD  Signature Date/Time: 10/05/2020/6:01:32 PM      Final    Results for orders placed or performed in visit on 10/05/20  ECHOCARDIOGRAM COMPLETE  Result Value Ref Range   AR max vel 2.33 cm2   AV Peak grad 12.1 mmHg   Ao pk vel 1.74 m/s   S' Lateral 2.10 cm   Area-P 1/2 2.92 cm2   AV Area VTI 2.07 cm2   AV Mean grad 6.0 mmHg   AV Area mean vel 1.96 cm2  Assessment & Plan:   Problem List Items Addressed This Visit    Morbid obesity with BMI of 50.0-59.9, adult (HCC)   Relevant Medications   WEGOVY 0.25 MG/0.5ML SOAJ   Generalized anxiety disorder with panic attacks - Primary    Suspected new dx GAD now with gradual worsening causing more difficulty functioning at times of stress or provoking situations. Issue with high stress patients/clients at work that contribute to her having panic symptoms and worsening her heart palpitations See GAD score - No prior medications  - No prior dx / Psych / counseling  Plan: 1. Discussion on new diagnosis anxiety, management, complications, likely  contributing to panic and  2. Start Escitalopram 5mg  dose (half of 10mg ) daily AM with food, counseling on potential side effects risks, reviewed possible GI intolerance, insomnia (although likely to improve this given anxiety likely source of insomnia), anticipate 4-6 weeks for notable effect, may need titrate dose to 10 to 20 in future 3. Advised recommend therapy / counseling in future 4. Follow-up 4-6 weeks anxiety, med adjust, GAD7/PHQ9  Letter for her employer written today. Patient may benefit from some work restrictions in future based on her anxiety symptoms, and may need FMLA going forward or other work restrictions, seems only certain situations or patient scenario tend to trigger, advised her that ultimately it is up to her and her employer to figure out how that will work, but I can write this initial note and she can look into getting approved medical work restriction if needed.       Relevant Medications   escitalopram (LEXAPRO) 10 MG tablet    Other Visit Diagnoses    Heart palpitations        Followed by Dr Saralyn Pilar [facility] ECHO has been done and they are managing her going forward with these symptoms and advised her to have anxiety treated first.  #Morbid Obeity BMI >52 Discussion on weight management today, not focus of visit Agreed to trial sample of Wegovy in office 0.25mg  pre filled syringe weekly x 4 weeks, can order higher doses going forward stepwise plan one month on each dose, if preferred by ins, she can let us know before returns    Meds ordered this encounter  Medications  . escitalopram (LEXAPRO) 10 MG tablet    Sig: Take 1 tablet (10 mg total) by mouth daily. Start with half tablet 5mg  daily for first 2-4 weeks, then increase to 1 whole tab 10mg  when ready.    Dispense:  30 tablet    Refill:  2  . WEGOVY 0.25 MG/0.5ML SOAJ    Sig: Inject 0.25 mg into the skin once a week.    Dispense:  2 mL    Refill:  0      Follow up plan: Return in about  4 weeks (around 12/25/2020) for 4 weeks anxiety GAD, obesity weight loss med.    Nobie Putnam, [facility] 11/27/2020, 10:02 AM

## 2020-12-09 DIAGNOSIS — M25561 Pain in right knee: Secondary | ICD-10-CM | POA: Diagnosis not present

## 2020-12-10 DIAGNOSIS — M25561 Pain in right knee: Secondary | ICD-10-CM | POA: Diagnosis not present

## 2020-12-21 DIAGNOSIS — G4733 Obstructive sleep apnea (adult) (pediatric): Secondary | ICD-10-CM | POA: Diagnosis not present

## 2020-12-24 DIAGNOSIS — M25561 Pain in right knee: Secondary | ICD-10-CM | POA: Diagnosis not present

## 2020-12-25 DIAGNOSIS — M25561 Pain in right knee: Secondary | ICD-10-CM | POA: Diagnosis not present

## 2021-01-04 ENCOUNTER — Other Ambulatory Visit: Payer: Self-pay

## 2021-01-04 ENCOUNTER — Encounter: Payer: Self-pay | Admitting: Obstetrics and Gynecology

## 2021-01-04 ENCOUNTER — Ambulatory Visit (INDEPENDENT_AMBULATORY_CARE_PROVIDER_SITE_OTHER): Payer: 59 | Admitting: Obstetrics and Gynecology

## 2021-01-04 VITALS — BP 128/80 | Ht 60.0 in | Wt 271.0 lb

## 2021-01-04 DIAGNOSIS — R102 Pelvic and perineal pain: Secondary | ICD-10-CM | POA: Diagnosis not present

## 2021-01-04 NOTE — Progress Notes (Signed)
Obstetrics & Gynecology Office Visit   Chief Complaint:  Chief Complaint  Patient presents with  . Vaginal Pain    Shooting pain in vagina. RM 5    History of Present Illness: 40 y.o. Stacey Taylor status post prior laparoscopic supracervical hysterectomy 03/08/2020 presenting with acute onset of pelvic pain around 12/31/2020.  Symptoms have been improving since that time.  She feels they are similar to menstrual cramping, slight more right sided.  No fevers chills.  No changes in bowl or bladder function.  No trauma or inciting event noted by patient.  She has had occasional light spotting since her hysterectomy (supracervical).     Review of Systems: Review of Systems  Constitutional: Negative.   Gastrointestinal: Positive for abdominal pain. Negative for constipation, diarrhea, nausea and vomiting.  Genitourinary: Negative.      Past Medical History:  Past Medical History:  Diagnosis Date  . Abnormal Pap smear of anus    HGSIL  . Anemia   . Back pain   . Cervical dysplasia    CIN III  . Endometriosis   . Gallbladder problem   . Headache   . Heart murmur   . Leg edema   . Premature delivery    x2    Past Surgical History:  Past Surgical History:  Procedure Laterality Date  . CERVICAL BIOPSY  W/ LOOP ELECTRODE EXCISION  11/2005  . CESAREAN SECTION  2008/2015  . CESAREAN SECTION WITH BILATERAL TUBAL LIGATION  2015  . CHOLECYSTECTOMY  2003  . COLPOSCOPY  2005, 2006  . COMBINED HYSTEROSCOPY DIAGNOSTIC / D&C  2014  . CYSTOSCOPY N/A 03/08/2020   Procedure: CYSTOSCOPY;  Surgeon: Malachy Mood, MD;  Location: [facility];  Service: Gynecology;  Laterality: N/A;  . LAPAROSCOPIC SUPRACERVICAL HYSTERECTOMY N/A 03/08/2020   Procedure: LAPAROSCOPIC SUPRACERVICAL HYSTERECTOMY WITH BILATERAL SALPINGECTOMY;  Surgeon: Malachy Mood, MD;  Location: [facility];  Service: Gynecology;  Laterality: N/A;    Gynecologic History: Patient's last menstrual period was  01/06/2020.  Obstetric History: [id number]  Family History:  Family History  Adopted: Yes  Family history unknown: Yes    Social History:  Social History   Socioeconomic History  . Marital status: Married    Spouse name: Christia Reading  . Number of children: 5  . Years of education: Not on file  . Highest education level: Not on file  Occupational History  . Occupation: CNA  Tobacco Use  . Smoking status: Never Smoker  . Smokeless tobacco: Never Used  Vaping Use  . Vaping Use: Never used  Substance and Sexual Activity  . Alcohol use: No    Alcohol/week: 0.0 standard drinks  . Drug use: No  . Sexual activity: Not Currently    Birth control/protection: Surgical    Comment: Tubal ligation  Other Topics Concern  . Not on file  Social History Narrative  . Not on file   Social Determinants of Health   Financial Resource Strain: Not on file  Food Insecurity: Not on file  Transportation Needs: Not on file  Physical Activity: Not on file  Stress: Not on file  Social Connections: Not on file  Intimate Partner Violence: Not on file    Allergies:  Allergies  Allergen Reactions  . Latex Hives  . Tramadol     Chest pain    Medications: Prior to Admission medications   Medication Sig Start Date End Date Taking? Authorizing Provider  escitalopram (LEXAPRO) 10 MG tablet Take 1 tablet (10 mg total) by  mouth daily. Start with half tablet 5mg  daily for first 2-4 weeks, then increase to 1 whole tab 10mg  when ready. 11/27/20   Karamalegos, Devonne Doughty, DO  furosemide (LASIX) 40 MG tablet Take 1 tablet (40 mg total) by mouth 2 (two) times daily as needed for fluid or edema. May take a 2nd dose in 24 hours if needed. Patient not taking: Reported on 11/27/2020 05/29/20   Olin Hauser, DO  potassium chloride (KLOR-CON) 10 MEQ tablet Take 1 tablet (10 mEq total) by mouth daily. 03/28/20   Karamalegos, Alexander J, DO  WEGOVY 0.25 MG/0.5ML SOAJ Inject 0.25 mg into the skin once a  week. 11/27/20   Karamalegos, Devonne Doughty, DO  loratadine (CLARITIN) 10 MG tablet Take 10 mg by mouth daily.  09/04/20  [provider]    Physical Exam Vitals:  Vitals:   01/04/21 1622  BP: 128/80   Patient's last menstrual period was 01/06/2020.  General: NAD HEENT: normocephalic, anicteric Pulmonary: No increased work of breathing Abdomen: soft, non-tender, non-distended.  Umbilicus without lesions.  No hepatomegaly, splenomegaly or masses palpable. No evidence of hernia  Genitourinary:  External: Normal external female genitalia.  Normal urethral meatus, normal  Bartholin's and Skene's glands.    Vagina: Normal vaginal mucosa, no evidence of prolapse.    Cervix: Grossly normal in appearance, no bleeding  Uterus: surgically absent  Adnexa: ovaries non-enlarged, no adnexal masses  Rectal: deferred  Lymphatic: no evidence of inguinal lymphadenopathy Extremities: no edema, erythema, or tenderness Neurologic: Grossly intact Psychiatric: mood appropriate, affect full  Female chaperone present for pelvic  portions of the physical exam  Assessment: 40 y.o. [id number] acute pelvic pain  Plan: Problem List Items Addressed This Visit   None   Visit Diagnoses    Acute pelvic pain, female    -  Primary      1) Pelvic pain - Some tenderness over right adnexa no clear masses.  Has been gradually improving since symptoms onset earlier this week.  Will observer over next 6 weeks if continued improvement/resolution no further follow up.  If acute exacerbation or continued symptoms in 6 weeks TVUS.  2) A total of 15 minutes were spent in face-to-face contact with the patient during this encounter with over half of that time devoted to counseling and coordination of care.  3) Return in about 9 months (around 10/06/2021) for annual.    Malachy Mood, MD, [place], [place]

## 2021-01-06 DIAGNOSIS — M25561 Pain in right knee: Secondary | ICD-10-CM | POA: Diagnosis not present

## 2021-01-07 DIAGNOSIS — M25561 Pain in right knee: Secondary | ICD-10-CM | POA: Diagnosis not present

## 2021-01-18 DIAGNOSIS — G4733 Obstructive sleep apnea (adult) (pediatric): Secondary | ICD-10-CM | POA: Diagnosis not present

## 2021-01-21 DIAGNOSIS — M25561 Pain in right knee: Secondary | ICD-10-CM | POA: Diagnosis not present

## 2021-01-22 DIAGNOSIS — M25561 Pain in right knee: Secondary | ICD-10-CM | POA: Diagnosis not present

## 2021-01-23 ENCOUNTER — Other Ambulatory Visit: Payer: Self-pay | Admitting: Family Medicine

## 2021-01-23 ENCOUNTER — Ambulatory Visit
Admission: RE | Admit: 2021-01-23 | Discharge: 2021-01-23 | Disposition: A | Discharge status: Home / Self Care | Payer: 59 | Source: Ambulatory Visit | Attending: Family Medicine | Admitting: Family Medicine

## 2021-01-23 ENCOUNTER — Other Ambulatory Visit: Payer: Self-pay

## 2021-01-23 VITALS — BP 130/86 | HR 94 | Temp 98.2°F | Resp 18

## 2021-01-23 DIAGNOSIS — J0110 Acute frontal sinusitis, unspecified: Secondary | ICD-10-CM | POA: Diagnosis not present

## 2021-01-23 MED ORDER — AMOXICILLIN-POT CLAVULANATE 875-125 MG PO TABS
1.0000 | ORAL_TABLET | Freq: Two times a day (BID) | ORAL | 0 refills | Status: DC
Start: 2021-01-23 — End: 2021-01-23

## 2021-01-23 NOTE — Discharge Instructions (Signed)
Treating you for a sinus infection Take the antibiotics as prescribed OTC medicines as needed

## 2021-01-23 NOTE — ED Provider Notes (Signed)
Stacey Taylor    CSN: [id number] Arrival date & time: 01/23/21  8299      History   Chief Complaint Chief Complaint  Patient presents with  . Sore Throat  . Cough    HPI Stacey Taylor is a 40 y.o. female.   Pt is a 40 year old female that presents with nasal drainage, sore throat, productive cough with green sputum, facial/sinus pressure for approx 1 week. Reports transient fever of 101-now resolved. Here today with concern for sinus infection and possible need for antibiotic. Reports that her son had URI symptoms and diarrhea last week.  Has been taking Sudafed, last dose last night. No tylenol or ibuprofen.    Sore Throat  Cough   Past Medical History:  Diagnosis Date  . Abnormal Pap smear of anus    HGSIL  . Anemia   . Back pain   . Cervical dysplasia    CIN III  . Endometriosis   . Gallbladder problem   . Headache   . Heart murmur   . Leg edema   . Premature delivery    x2    Patient Active Problem List   Diagnosis Date Noted  . Generalized anxiety disorder with panic attacks 11/27/2020  . Diastolic dysfunction 37/16/9678  . OSA (obstructive sleep apnea) 03/13/2020  . Abnormal uterine bleeding (AUB)   . S/P hysterectomy 03/08/2020  . S/P laparoscopic supracervical hysterectomy 03/08/2020  . Lymphedema 08/15/2019  . Bilateral primary osteoarthritis of knee 06/17/2019  . Dizziness 09/16/2018  . Headache disorder 09/16/2018  . Chronic migraine 07/15/2018  . Onychomycosis 03/11/2018  . Vitamin B12 deficiency 01/11/2018  . Pre-diabetes 01/11/2018  . Other fatigue 12/10/2017  . Shortness of breath on exertion 12/10/2017  . Edema 12/10/2017  . Vitamin D deficiency 12/10/2017  . Bilateral lower extremity edema 07/09/2017  . Dyspnea on exertion 07/09/2017  . Essential hypertension 07/09/2017  . Anemia 07/09/2017  . Chronic low back pain with left-sided sciatica 09/09/2016  . Morbid obesity with BMI of 50.0-59.9, adult ([place]) 11/23/2015     Past Surgical History:  Procedure Laterality Date  . CERVICAL BIOPSY  W/ LOOP ELECTRODE EXCISION  11/2005  . CESAREAN SECTION  2008/2015  . CESAREAN SECTION WITH BILATERAL TUBAL LIGATION  2015  . CHOLECYSTECTOMY  2003  . COLPOSCOPY  2005, 2006  . COMBINED HYSTEROSCOPY DIAGNOSTIC / D&C  2014  . CYSTOSCOPY N/A 03/08/2020   Procedure: CYSTOSCOPY;  Surgeon: Malachy Mood, MD;  Location: [facility];  Service: Gynecology;  Laterality: N/A;  . LAPAROSCOPIC SUPRACERVICAL HYSTERECTOMY N/A 03/08/2020   Procedure: LAPAROSCOPIC SUPRACERVICAL HYSTERECTOMY WITH BILATERAL SALPINGECTOMY;  Surgeon: Malachy Mood, MD;  Location: [facility];  Service: Gynecology;  Laterality: N/A;    OB History    Gravida  5   Para  5   Term  3   Preterm  2   AB      Living  5     SAB      IAB      Ectopic      Multiple      Live Births  5            Home Medications    Prior to Admission medications   Medication Sig Start Date End Date Taking? Authorizing Provider  amoxicillin-clavulanate (AUGMENTIN) 875-125 MG tablet Take 1 tablet by mouth every 12 (twelve) hours. 01/23/21  Yes Sharalyn Lomba A, NP  furosemide (LASIX) 40 MG tablet Take 40 mg by mouth 2 (two)  times daily as needed for fluid or edema. May take a 2nd dose in 24 hours if needed. 05/29/20  Yes Karamalegos, Devonne Doughty, DO  potassium chloride (KLOR-CON) 10 MEQ tablet Take 1 tablet (10 mEq total) by mouth daily. 03/28/20  Yes Karamalegos, Devonne Doughty, DO  escitalopram (LEXAPRO) 10 MG tablet Take 1 tablet (10 mg total) by mouth daily. Start with half tablet 5mg  daily for first 2-4 weeks, then increase to 1 whole tab 10mg  when ready. 11/27/20   Karamalegos, Alexander J, DO  WEGOVY 0.25 MG/0.5ML SOAJ Inject 0.25 mg into the skin once a week. 11/27/20   Karamalegos, Devonne Doughty, DO  loratadine (CLARITIN) 10 MG tablet Take 10 mg by mouth daily.  09/04/20  [provider]    Family History Family History  Adopted: Yes  Family  history unknown: Yes    Social History Social History   Tobacco Use  . Smoking status: Never Smoker  . Smokeless tobacco: Never Used  Vaping Use  . Vaping Use: Never used  Substance Use Topics  . Alcohol use: No    Alcohol/week: 0.0 standard drinks  . Drug use: No     Allergies   Latex and Tramadol   Review of Systems Review of Systems  Respiratory: Positive for cough.      Physical Exam Triage Vital Signs ED Triage Vitals  Enc Vitals Group     BP 01/23/21 1004 130/86     Pulse Rate 01/23/21 1004 94     Resp 01/23/21 1004 18     Temp 01/23/21 1004 98.2 F (36.8 C)     Temp Source 01/23/21 1004 Oral     SpO2 01/23/21 1004 98 %     Weight --      Height --      Head Circumference --      Peak Flow --      Pain Score 01/23/21 1010 6     Pain Loc --      Pain Edu? --      Excl. in [place]? --    No data found.  Updated Vital Signs BP 130/86 (BP Location: Left Arm)   Pulse 94   Temp 98.2 F (36.8 C) (Oral)   Resp 18   LMP 01/06/2020   SpO2 98%   Visual Acuity Right Eye Distance:   Left Eye Distance:   Bilateral Distance:    Right Eye Near:   Left Eye Near:    Bilateral Near:     Physical Exam Vitals and nursing note reviewed.  Constitutional:      General: She is not in acute distress.    Appearance: Normal appearance. She is not ill-appearing, toxic-appearing or diaphoretic.  HENT:     Head: Normocephalic.     Right Ear: Tympanic membrane and ear canal normal.     Left Ear: Tympanic membrane and ear canal normal.     Nose: Congestion present.     Mouth/Throat:     Pharynx: Oropharynx is clear.     Comments: Laryngitis  Eyes:     Conjunctiva/sclera: Conjunctivae normal.  Cardiovascular:     Rate and Rhythm: Normal rate and regular rhythm.  Pulmonary:     Effort: Pulmonary effort is normal.     Breath sounds: Normal breath sounds.  Musculoskeletal:        General: Normal range of motion.     Cervical back: Normal range of motion.   Skin:    General: Skin is warm and  dry.     Findings: No rash.  Neurological:     Mental Status: She is alert.  Psychiatric:        Mood and Affect: Mood normal.      UC Treatments / Results  Labs (all labs ordered are listed, but only abnormal results are displayed) Labs Reviewed - No data to display  EKG   Radiology No results found.  Procedures Procedures (including critical care time)  Medications Ordered in UC Medications - No data to display  Initial Impression / Assessment and Plan / UC Course  I have reviewed the triage vital signs and the nursing notes.  Pertinent labs & imaging results that were available during my care of the patient were reviewed by me and considered in my medical decision making (see chart for details).     Sinusitis Treating for bacterial sinus infection abx as prescribed OTC meds as needed  Follow up as needed for continued or worsening symptoms  Final Clinical Impressions(s) / UC Diagnoses   Final diagnoses:  Acute non-recurrent frontal sinusitis     Discharge Instructions     Treating you for a sinus infection Take the antibiotics as prescribed OTC medicines as needed    ED Prescriptions    Medication Sig Dispense Auth. Provider   amoxicillin-clavulanate (AUGMENTIN) 875-125 MG tablet Take 1 tablet by mouth every 12 (twelve) hours. 14 tablet Zoriana Oats A, NP     PDMP not reviewed this encounter.   Loura Halt A, NP 01/23/21 1048

## 2021-01-23 NOTE — ED Triage Notes (Signed)
Pt c/o nasal drainage, sore throat, productive cough with green sputum, facial/sinus pressure for approx 1 week. Reports transient fever of 101-now resolved. Here today with concern for sinus infection and possible need for antibiotic.  Reports that her son had URI symptoms and diarrhea last week.  Has been taking Sudafed, last dose last night. No tylenol or ibuprofen.  Does not regularly take lasix and potassium 2/2 work restraints.

## 2021-02-05 ENCOUNTER — Other Ambulatory Visit: Payer: Self-pay

## 2021-02-06 DIAGNOSIS — M25561 Pain in right knee: Secondary | ICD-10-CM | POA: Diagnosis not present

## 2021-02-07 DIAGNOSIS — M25561 Pain in right knee: Secondary | ICD-10-CM | POA: Diagnosis not present

## 2021-02-18 DIAGNOSIS — G4733 Obstructive sleep apnea (adult) (pediatric): Secondary | ICD-10-CM | POA: Diagnosis not present

## 2021-02-21 DIAGNOSIS — M25561 Pain in right knee: Secondary | ICD-10-CM | POA: Diagnosis not present

## 2021-02-22 DIAGNOSIS — M25561 Pain in right knee: Secondary | ICD-10-CM | POA: Diagnosis not present

## 2021-02-26 DIAGNOSIS — L811 Chloasma: Secondary | ICD-10-CM | POA: Diagnosis not present

## 2021-03-23 DIAGNOSIS — M25561 Pain in right knee: Secondary | ICD-10-CM | POA: Diagnosis not present

## 2021-03-26 DIAGNOSIS — I491 Atrial premature depolarization: Secondary | ICD-10-CM | POA: Diagnosis not present

## 2021-03-26 DIAGNOSIS — R42 Dizziness and giddiness: Secondary | ICD-10-CM | POA: Diagnosis not present

## 2021-03-26 DIAGNOSIS — R002 Palpitations: Secondary | ICD-10-CM | POA: Diagnosis not present

## 2021-03-26 DIAGNOSIS — R0602 Shortness of breath: Secondary | ICD-10-CM | POA: Diagnosis not present

## 2021-03-27 DIAGNOSIS — Z03818 Encounter for observation for suspected exposure to other biological agents ruled out: Secondary | ICD-10-CM | POA: Diagnosis not present

## 2021-03-27 DIAGNOSIS — Z20822 Contact with and (suspected) exposure to covid-19: Secondary | ICD-10-CM | POA: Diagnosis not present

## 2021-03-29 ENCOUNTER — Ambulatory Visit: Payer: Self-pay | Admitting: *Deleted

## 2021-03-29 ENCOUNTER — Telehealth: Payer: 59 | Admitting: Emergency Medicine

## 2021-03-29 ENCOUNTER — Encounter: Payer: Self-pay | Admitting: Emergency Medicine

## 2021-03-29 DIAGNOSIS — R059 Cough, unspecified: Secondary | ICD-10-CM | POA: Diagnosis not present

## 2021-03-29 DIAGNOSIS — U071 COVID-19: Secondary | ICD-10-CM

## 2021-03-29 DIAGNOSIS — R112 Nausea with vomiting, unspecified: Secondary | ICD-10-CM | POA: Diagnosis not present

## 2021-03-29 MED ORDER — ONDANSETRON 4 MG PO TBDP: 4.0000 mg | ORAL_TABLET | Freq: Three times a day (TID) | ORAL | 0 refills | Status: DC | PRN

## 2021-03-29 MED ORDER — MOLNUPIRAVIR EUA 200MG CAPSULE: 4.0000 | ORAL_CAPSULE | Freq: Two times a day (BID) | ORAL | 0 refills | Status: AC

## 2021-03-29 MED ORDER — BENZONATATE 100 MG PO CAPS: 100.0000 mg | ORAL_CAPSULE | Freq: Three times a day (TID) | ORAL | 0 refills | Status: DC | PRN

## 2021-03-29 NOTE — Telephone Encounter (Signed)
Pt is calling because she was dx with COVID on Wednesday is wanting advise Cough medication can take. She is already taking a 24 hour decongestant. Please advise   Call to patient- patient is + COVID and she is having a hard time with her cough and congestion. Advised patient do virtual visit due to her symptoms and her PCP does not have appointment available today. Patient has scheduled and will follow up with provider at [facility] for continued COVID management.  Reason for Disposition . [1] Continuous (nonstop) coughing interferes with work or school AND [2] no improvement using cough treatment per Care Advice  Answer Assessment - Initial Assessment Questions 1. COVID-19 DIAGNOSIS: "Who made your COVID-19 diagnosis?" "Was it confirmed by a positive lab test or self-test?" If not diagnosed by a doctor (or NP/PA), ask "Are there lots of cases (community spread) where you live?" Note: See public health department website, if unsure.     Lab test-5/25 2. COVID-19 EXPOSURE: "Was there any known exposure to COVID before the symptoms began?" CDC Definition of close contact: within 6 feet (2 meters) for a total of 15 minutes or more over a 24-hour period.      Exposure at work 3. ONSET: "When did the COVID-19 symptoms start?"      Late Tuesday 4. WORST SYMPTOM: "What is your worst symptom?" (e.g., cough, fever, shortness of breath, muscle aches)     Cough, congestion 5. COUGH: "Do you have a cough?" If Yes, ask: "How bad is the cough?"       Yes- worse when laying down, uses CPAP 6. FEVER: "Do you have a fever?" If Yes, ask: "What is your temperature, how was it measured, and when did it start?"     Yes- 102.5 last night, 100.0 now- ear 7. RESPIRATORY STATUS: "Describe your breathing?" (e.g., shortness of breath, wheezing, unable to speak)      Breathing ok- no chest tightness 8. BETTER-SAME-WORSE: "Are you getting better, staying the same or getting worse compared to yesterday?"  If getting worse, ask,  "In what way?"     Worse- more congested 9. HIGH RISK DISEASE: "Do you have any chronic medical problems?" (e.g., asthma, heart or lung disease, weak immune system, obesity, etc.)     Heart disease, sleep apnea 10. VACCINE: "Have you had the COVID-19 vaccine?" If Yes, ask: "Which one, how many shots, when did you get it?"       Yes- moderna 11. BOOSTER: "Have you received your COVID-19 booster?" If Yes, ask: "Which one and when did you get it?"       no 12. PREGNANCY: "Is there any chance you are pregnant?" "When was your last menstrual period?"       n/a 13. OTHER SYMPTOMS: "Do you have any other symptoms?"  (e.g., chills, fatigue, headache, loss of smell or taste, muscle pain, sore throat)       Headache, fatigue, sore throat, bodyache 14. O2 SATURATION MONITOR:  "Do you use an oxygen saturation monitor (pulse oximeter) at home?" If Yes, ask "What is your reading (oxygen level) today?" "What is your usual oxygen saturation reading?" (e.g., 95%)       no  Protocols used: CORONAVIRUS (COVID-19) DIAGNOSED OR SUSPECTED-A-AH

## 2021-03-29 NOTE — Patient Instructions (Addendum)
  Molnupiravir things to know: Antiviral to help treat COVID If you develop a rash, oral swelling or trouble breathing, please stop the medication and seek immediate medical attention as these are signs of an allergic reaction.  Be sure to use birth control during and up to 5 days after completing the antiviral medication.  You can find more detailed drug information with your prescription when you pick it up, and you may always discuss new medications with your pharmacist.   Continue to take Tylenol as needed for fever and body aches. Tessalon as prescribed for cough.  Stay well hydrated and get plenty of rest.   Follow up with family medicine next week if not improving. Go to an urgent care or emergency department if symptoms worsening this evening or over the weekend.   Visit for Positive Covid Test Result We are sorry you are not feeling well. We are here to help!  You have tested positive for COVID-19, meaning that you were infected with the novel coronavirus and could give the virus to others.  It is vitally important that you stay home so you do not spread it to others.      Please continue isolation at home, for at least 5 days since the start of your symptoms and until you have had 24 hours with no fever (without taking a fever reducer) and with improving of symptoms.  If all symptoms resolve after 5 days and you have no fever you can leave your house but continue to wear a mask around others for an additional 5 days. If you have a fever,continue to stay home until you have had 24 hours of no fever. Most cases improve 5-10 days from onset but we have seen a small number of patients who have gotten worse after the 10 days.  Please be sure to watch for worsening symptoms and remain taking the proper precautions.   Go to the nearest hospital ED for assessment if fever/cough/breathlessness are severe or illness seems like a threat to life.    The following symptoms may appear 2-14 days  after exposure: . Fever . Cough . Shortness of breath or difficulty breathing . Chills . Repeated shaking with chills . Muscle pain . Headache . Sore throat . New loss of taste or smell . Fatigue . Congestion or runny nose . Nausea or vomiting . Diarrhea  You have been enrolled in [facility] for COVID-19. Daily you will receive a questionnaire within the [employer] website. Our COVID-19 response team will be monitoring your responses daily.  You may also take acetaminophen (Tylenol) as needed for fever.  HOME CARE: . Only take medications as instructed by your medical team. . Drink plenty of fluids and get plenty of rest. . A steam or ultrasonic humidifier can help if you have congestion.   GET HELP RIGHT AWAY IF YOU HAVE EMERGENCY WARNING SIGNS.  Call 911 or proceed to your closest emergency facility if: . You develop worsening high fever. . Trouble breathing . Bluish lips or face . Persistent pain or pressure in the chest . New confusion . Inability to wake or stay awake . You cough up blood. . Your symptoms become more severe . Inability to hold down food or fluids  This list is not all possible symptoms. Contact your medical provider for any symptoms that are severe or concerning to you.

## 2021-03-29 NOTE — Progress Notes (Signed)
Ms. Stacey Taylor, [place] are scheduled for a virtual visit with your provider today.    Just as we do with appointments in the office, we must obtain your consent to participate.  Your consent will be active for this visit and any virtual visit you may have with one of our providers in the next 365 days.    If you have a MyChart account, I can also send a copy of this consent to you electronically.  All virtual visits are billed to your insurance company just like a traditional visit in the office.  As this is a virtual visit, video technology does not allow for your provider to perform a traditional examination.  This may limit your provider's ability to fully assess your condition.  If your provider identifies any concerns that need to be evaluated in person or the need to arrange testing such as labs, EKG, etc, we will make arrangements to do so.    Although advances in technology are sophisticated, we cannot ensure that it will always work on either your end or our end.  If the connection with a video visit is poor, we may have to switch to a telephone visit.  With either a video or telephone visit, we are not always able to ensure that we have a secure connection.   I need to obtain your verbal consent now.   Are you willing to proceed with your visit today?   Magda A Rood has provided verbal consent on 03/29/2021 for a virtual visit (video or telephone).   Noe Gens, PA-C 03/29/2021  1:51 PM   Date:  03/29/2021   ID:  Stacey Taylor, DOB 1981-07-05, MRN [id number]  Patient Location: Home Provider Location: Home Office   Participants: Patient and Provider for Visit and Wrap up  Method of visit: Video  Location of Patient: Home Location of Provider: Home Office Consent was obtain for visit over the video. Services rendered by provider: Visit was performed via video  A video enabled telemedicine application was used and I verified that I am speaking with the correct person using two  identifiers.  PCP:  Olin Hauser, DO   Chief Complaint:  Cough, COVID positive 5/25  History of Present Illness:    Stacey Taylor is a 40 y.o. female with history as stated below. Presents video telehealth for an acute care visit  Onset of symptoms was 03/26/21 and symptoms have been persistent and include: cough, congestion, mild generalized headache, body aches, sore throat and fever Tmax 102*F.  She has also had 2 episodes of vomiting since symptom onset, last episode was yesterday.  Still has mild nausea. Has been able to keep down fluids.  Denies having shortness of breath or diarrhea.  Denies chest pain.   Modifying factors include: Tylenol Extra Strength helps temporarily with fever and body aches, Vitamin C and D supplements. OTC [employer] Claritin decongestant.   No other aggravating or relieving factors. No other c/o.   The patient does have symptoms concerning for COVID-19 infection (fever, chills, cough, or new shortness of breath).  Patient has been tested for COVID during this illness, positive result on 03/27/2021.  Past Medical, Surgical, Social History, Allergies, and Medications have been Reviewed.  Patient Active Problem List   Diagnosis Date Noted  . Generalized anxiety disorder with panic attacks 11/27/2020  . Diastolic dysfunction 80/99/8338  . OSA (obstructive sleep apnea) 03/13/2020  . Abnormal uterine bleeding (AUB)   . S/P hysterectomy 03/08/2020  . S/P  laparoscopic supracervical hysterectomy 03/08/2020  . Lymphedema 08/15/2019  . Bilateral primary osteoarthritis of knee 06/17/2019  . Dizziness 09/16/2018  . Headache disorder 09/16/2018  . Chronic migraine 07/15/2018  . Onychomycosis 03/11/2018  . Vitamin B12 deficiency 01/11/2018  . Pre-diabetes 01/11/2018  . Other fatigue 12/10/2017  . Shortness of breath on exertion 12/10/2017  . Edema 12/10/2017  . Vitamin D deficiency 12/10/2017  . Bilateral lower extremity edema 07/09/2017  .  Dyspnea on exertion 07/09/2017  . Essential hypertension 07/09/2017  . Anemia 07/09/2017  . Chronic low back pain with left-sided sciatica 09/09/2016  . Morbid obesity with BMI of 50.0-59.9, adult ([place]) 11/23/2015    Social History   Tobacco Use  . Smoking status: Never Smoker  . Smokeless tobacco: Never Used  Substance Use Topics  . Alcohol use: No    Alcohol/week: 0.0 standard drinks     Current Outpatient Medications:  .  benzonatate (TESSALON) 100 MG capsule, Take 1-2 capsules (100-200 mg total) by mouth 3 (three) times daily as needed., Disp: 20 capsule, Rfl: 0 .  molnupiravir EUA 200 mg CAPS, Take 4 capsules (800 mg total) by mouth 2 (two) times daily for 5 days., Disp: 40 capsule, Rfl: 0 .  ondansetron (ZOFRAN-ODT) 4 MG disintegrating tablet, Take 1 tablet (4 mg total) by mouth every 8 (eight) hours as needed for nausea or vomiting., Disp: 12 tablet, Rfl: 0 .  amoxicillin-clavulanate (AUGMENTIN) 875-125 MG tablet, TAKE 1 TABLET BY MOUTH EVERY 12 HOURS., Disp: 14 tablet, Rfl: 0 .  escitalopram (LEXAPRO) 10 MG tablet, START WITH 1/2 TABLET (5MG ) BY MOUTH DAILY FOR FIRST 2-4 WEEKS, THEN INCREASE TO 1 TABLET WHEN READY, Disp: 30 tablet, Rfl: 2 .  furosemide (LASIX) 40 MG tablet, Take 40 mg by mouth 2 (two) times daily as needed for fluid or edema. May take a 2nd dose in 24 hours if needed., Disp: 60 tablet, Rfl: 3 .  furosemide (LASIX) 40 MG tablet, TAKE 1 TABLET BY MOUTH DAILY AS NEEDED FOR FLUID OR EDEMA. MAY TAKE A 2ND DOSE IN 24 HOURS IF NEEDED., Disp: 45 tablet, Rfl: 3 .  potassium chloride (KLOR-CON) 10 MEQ tablet, TAKE 1 TABLET BY MOUTH DAILY., Disp: 30 tablet, Rfl: 3 .  WEGOVY 0.25 MG/0.5ML SOAJ, Inject 0.25 mg into the skin once a week., Disp: 2 mL, Rfl: 0   Allergies  Allergen Reactions  . Latex Hives  . Tramadol     Chest pain     ROS See HPI for history of present illness.  Physical Exam Constitutional:      General: She is not in acute distress.     Appearance: Normal appearance. She is obese. She is not ill-appearing, toxic-appearing or diaphoretic.     Comments: Pt resting at home, appears well. No acute distress.  HENT:     Head: Normocephalic.  Eyes:     Extraocular Movements: Extraocular movements intact.  Pulmonary:     Effort: Pulmonary effort is normal. No respiratory distress.     Comments: Able to speak in full sentences during visit without coughing or respiratory distress.  Musculoskeletal:     Cervical back: Normal range of motion.  Neurological:     General: No focal deficit present.     Mental Status: She is alert.  Psychiatric:        Mood and Affect: Mood normal.        Behavior: Behavior normal.  A&P  1. COVID-19  -Molnupiravir 800mg  by mouth twice daily for 5 days  -may continue Tylenol for fever and pain  2. Cough  Tessalon 100-200mg  3 times daily as needed   -stop decongestant for now  3. Nausea, vomiting  -ondansatron 4mg  ODT every 8 hours as needed for nausea/vomiting  Encouraged good hydration and rest.    Patient voiced understanding and agreement to plan.   Time:   Today, I have spent 15 minutes with the patient with telehealth technology discussing the above problems, reviewing the chart, previous notes, medications and orders.    Tests Ordered: No orders of the defined types were placed in this encounter.   Medication Changes: Meds ordered this encounter  Medications  . benzonatate (TESSALON) 100 MG capsule    Sig: Take 1-2 capsules (100-200 mg total) by mouth 3 (three) times daily as needed.    Dispense:  20 capsule    Refill:  0  . ondansetron (ZOFRAN-ODT) 4 MG disintegrating tablet    Sig: Take 1 tablet (4 mg total) by mouth every 8 (eight) hours as needed for nausea or vomiting.    Dispense:  12 tablet    Refill:  0  . molnupiravir EUA 200 mg CAPS    Sig: Take 4 capsules (800 mg total) by mouth 2 (two) times daily for 5 days.    Dispense:  40 capsule     Refill:  0     Disposition:  Follow up PCP next week if needed. Encouraged to seek care at [facility] or Emergency Department if symptoms worsening this evening or over the weekend.   Etta Grandchild, PA-C  03/29/2021 1:51 PM

## 2021-04-02 DIAGNOSIS — Z20822 Contact with and (suspected) exposure to covid-19: Secondary | ICD-10-CM | POA: Diagnosis not present

## 2021-04-02 DIAGNOSIS — Z03818 Encounter for observation for suspected exposure to other biological agents ruled out: Secondary | ICD-10-CM | POA: Diagnosis not present

## 2021-04-08 ENCOUNTER — Other Ambulatory Visit: Payer: Self-pay | Admitting: Emergency Medicine

## 2021-04-08 DIAGNOSIS — U071 COVID-19: Secondary | ICD-10-CM

## 2021-04-10 DIAGNOSIS — L811 Chloasma: Secondary | ICD-10-CM | POA: Diagnosis not present

## 2021-04-15 ENCOUNTER — Ambulatory Visit (INDEPENDENT_AMBULATORY_CARE_PROVIDER_SITE_OTHER): Payer: 59

## 2021-04-15 ENCOUNTER — Other Ambulatory Visit: Payer: Self-pay

## 2021-04-15 ENCOUNTER — Ambulatory Visit
Admission: RE | Admit: 2021-04-15 | Discharge: 2021-04-15 | Disposition: A | Discharge status: Home / Self Care | Payer: 59 | Source: Ambulatory Visit | Attending: Emergency Medicine | Admitting: Emergency Medicine

## 2021-04-15 VITALS — BP 128/88 | HR 80 | Temp 98.3°F | Resp 18

## 2021-04-15 DIAGNOSIS — M79641 Pain in right hand: Secondary | ICD-10-CM | POA: Diagnosis not present

## 2021-04-15 NOTE — ED Provider Notes (Signed)
[name]    CSN: [id number] Arrival date & time: 04/15/21  8032      History   Chief Complaint Chief Complaint  Patient presents with   Hand Pain   0900 APPT    HPI Stacey Taylor is a 40 y.o. female.  Patient presents with right hand pain x5 days.  No known trauma.  Her symptoms started after she fell asleep with her phone in her hand with her index finger extended.  The pain is worse with extension of index finger and with palpation of her MCP at the base of her index finger.  She denies numbness, weakness, paresthesias, redness, bruising, swelling, or other symptoms.  Treatment attempted at home with Tylenol and ibuprofen.  Her medical history includes hypertension, diastolic dysfunction, prediabetes, morbid obesity, chronic back pain, chronic migraine headaches, anxiety.    The history is provided by the patient and medical records.   Past Medical History:  Diagnosis Date   Abnormal Pap smear of anus    HGSIL   Anemia    Back pain    Cervical dysplasia    CIN III   Endometriosis    Gallbladder problem    Headache    Heart murmur    Leg edema    Premature delivery    x2    Patient Active Problem List   Diagnosis Date Noted   Generalized anxiety disorder with panic attacks 10/26/8249   Diastolic dysfunction 03/70/4888   OSA (obstructive sleep apnea) 03/13/2020   Abnormal uterine bleeding (AUB)    S/P hysterectomy 03/08/2020   S/P laparoscopic supracervical hysterectomy 03/08/2020   Lymphedema 08/15/2019   Bilateral primary osteoarthritis of knee 06/17/2019   Dizziness 09/16/2018   Headache disorder 09/16/2018   Chronic migraine 07/15/2018   Onychomycosis 03/11/2018   Vitamin B12 deficiency 01/11/2018   Pre-diabetes 01/11/2018   Other fatigue 12/10/2017   Shortness of breath on exertion 12/10/2017   Edema 12/10/2017   Vitamin D deficiency 12/10/2017   Bilateral lower extremity edema 07/09/2017   Dyspnea on exertion 07/09/2017   Essential  hypertension 07/09/2017   Anemia 07/09/2017   Chronic low back pain with left-sided sciatica 09/09/2016   Morbid obesity with BMI of 50.0-59.9, adult ([place]) 11/23/2015    Past Surgical History:  Procedure Laterality Date   CERVICAL BIOPSY  W/ LOOP ELECTRODE EXCISION  11/2005   CESAREAN SECTION  2008/2015   CESAREAN SECTION WITH BILATERAL TUBAL LIGATION  2015   CHOLECYSTECTOMY  2003   COLPOSCOPY  2005, 2006   COMBINED HYSTEROSCOPY DIAGNOSTIC / D&C  2014   CYSTOSCOPY N/A 03/08/2020   Procedure: CYSTOSCOPY;  Surgeon: Malachy Mood, MD;  Location: [facility] ORS;  Service: Gynecology;  Laterality: N/A;   LAPAROSCOPIC SUPRACERVICAL HYSTERECTOMY N/A 03/08/2020   Procedure: LAPAROSCOPIC SUPRACERVICAL HYSTERECTOMY WITH BILATERAL SALPINGECTOMY;  Surgeon: Malachy Mood, MD;  Location: [facility] ORS;  Service: Gynecology;  Laterality: N/A;    OB History     Gravida  5   Para  5   Term  3   Preterm  2   AB      Living  5      SAB      IAB      Ectopic      Multiple      Live Births  5            Home Medications    Prior to Admission medications   Medication Sig Start Date End Date Taking? Authorizing Provider  WEGOVY 0.25 MG/0.5ML SOAJ Inject 0.25 mg into the skin once a week. 11/27/20  Yes Karamalegos, Devonne Doughty, DO  amoxicillin-clavulanate (AUGMENTIN) 875-125 MG tablet TAKE 1 TABLET BY MOUTH EVERY 12 HOURS. 01/23/21 01/23/22  Bast, Tressia Miners A, NP  benzonatate (TESSALON) 100 MG capsule Take 1-2 capsules (100-200 mg total) by mouth 3 (three) times daily as needed. 03/29/21   Noe Gens, PA-C  escitalopram (LEXAPRO) 10 MG tablet START WITH 1/2 TABLET (5MG ) BY MOUTH DAILY FOR FIRST 2-4 WEEKS, THEN INCREASE TO 1 TABLET WHEN READY 11/27/20 11/27/21  Karamalegos, Devonne Doughty, DO  furosemide (LASIX) 40 MG tablet Take 40 mg by mouth 2 (two) times daily as needed for fluid or edema. May take a 2nd dose in 24 hours if needed. 05/29/20   Karamalegos, Devonne Doughty, DO  furosemide (LASIX) 40  MG tablet TAKE 1 TABLET BY MOUTH DAILY AS NEEDED FOR FLUID OR EDEMA. MAY TAKE A 2ND DOSE IN 24 HOURS IF NEEDED. 03/28/20 03/28/21  Olin Hauser, DO  ondansetron (ZOFRAN-ODT) 4 MG disintegrating tablet Take 1 tablet (4 mg total) by mouth every 8 (eight) hours as needed for nausea or vomiting. 03/29/21   Noe Gens, PA-C  potassium chloride (KLOR-CON) 10 MEQ tablet TAKE 1 TABLET BY MOUTH DAILY. 03/28/20 03/28/21  Karamalegos, Devonne Doughty, DO  loratadine (CLARITIN) 10 MG tablet Take 10 mg by mouth daily.  09/04/20  [provider]    Family History Family History  Adopted: Yes  Family history unknown: Yes    Social History Social History   Tobacco Use   Smoking status: Never   Smokeless tobacco: Never  Vaping Use   Vaping Use: Never used  Substance Use Topics   Alcohol use: No    Alcohol/week: 0.0 standard drinks   Drug use: No     Allergies   Latex and Tramadol   Review of Systems Review of Systems  Constitutional:  Negative for chills and fever.  Respiratory:  Negative for cough and shortness of breath.   Cardiovascular:  Negative for chest pain and palpitations.  Gastrointestinal:  Negative for abdominal pain and vomiting.  Musculoskeletal:  Positive for arthralgias. Negative for back pain.  Skin:  Negative for color change, rash and wound.  Neurological:  Negative for weakness and numbness.  All other systems reviewed and are negative.   Physical Exam Triage Vital Signs ED Triage Vitals  Enc Vitals Group     BP      Pulse      Resp      Temp      Temp src      SpO2      Weight      Height      Head Circumference      Peak Flow      Pain Score      Pain Loc      Pain Edu?      Excl. in [place]?    No data found.  Updated Vital Signs BP 128/88 (BP Location: Left Arm)   Pulse 80   Temp 98.3 F (36.8 C) (Oral)   Resp 18   LMP 01/06/2020   SpO2 97%   Visual Acuity Right Eye Distance:   Left Eye Distance:   Bilateral Distance:     Right Eye Near:   Left Eye Near:    Bilateral Near:     Physical Exam Vitals and nursing note reviewed.  Constitutional:      General: She is  not in acute distress.    Appearance: She is well-developed.  HENT:     Head: Normocephalic and atraumatic.     Mouth/Throat:     Mouth: Mucous membranes are moist.  Eyes:     Conjunctiva/sclera: Conjunctivae normal.  Cardiovascular:     Rate and Rhythm: Normal rate and regular rhythm.     Heart sounds: Normal heart sounds.  Pulmonary:     Effort: Pulmonary effort is normal. No respiratory distress.     Breath sounds: Normal breath sounds.  Abdominal:     Palpations: Abdomen is soft.     Tenderness: There is no abdominal tenderness.  Musculoskeletal:        General: Tenderness present. No swelling or deformity. Normal range of motion.       Hands:     Cervical back: Neck supple.  Skin:    General: Skin is warm and dry.     Capillary Refill: Capillary refill takes less than 2 seconds.     Findings: No bruising, erythema, lesion or rash.  Neurological:     General: No focal deficit present.     Mental Status: She is alert.     Sensory: No sensory deficit.     Motor: No weakness.  Psychiatric:        Mood and Affect: Mood normal.        Behavior: Behavior normal.     UC Treatments / Results  Labs (all labs ordered are listed, but only abnormal results are displayed) Labs Reviewed - No data to display  EKG   Radiology DG Hand Complete Right  Result Date: 04/15/2021 CLINICAL DATA:  Hand pain for 5 days after falling asleep with phone in hand. EXAM: RIGHT HAND - COMPLETE 3+ VIEW COMPARISON:  None. FINDINGS: There is no evidence of fracture or dislocation. There is no evidence of arthropathy or other focal bone abnormality. Soft tissues are unremarkable. IMPRESSION: Negative. Electronically Signed   By: Monte Fantasia M.D.   On: 04/15/2021 10:03    Procedures Procedures (including critical care time)  Medications  Ordered in UC Medications - No data to display  Initial Impression / Assessment and Plan / UC Course  I have reviewed the triage vital signs and the nursing notes.  Pertinent labs & imaging results that were available during my care of the patient were reviewed by me and considered in my medical decision making (see chart for details).  Right hand pain.  X-ray negative.  Instructed patient to follow-up with an orthopedic hand specialist.  She declined splint or wrap today.  Instructed her to take Tylenol or ibuprofen as needed for discomfort.  She agrees to plan of care.     Final Clinical Impressions(s) / UC Diagnoses   Final diagnoses:  Right hand pain     Discharge Instructions      Your x-ray is normal.  Take Tylenol or ibuprofen as needed for discomfort.  Schedule a follow-up appointment with an orthopedic hand specialist such as the one listed below.         ED Prescriptions   None    PDMP not reviewed this encounter.   Sharion Balloon, NP 04/15/21 1019

## 2021-04-15 NOTE — ED Triage Notes (Signed)
Patient c/o RT hand (pointer finger) x 5 day.    Patient denies any fall or trauma.   Patient endorses onset of symptoms began " after falling asleep with the phone in my hand".   Patient endorses increased pain upon " opening finger all the way". " It doesn't hurt to close it". Patient endorses increased pain with "using my hand".   Patient denies redness.   Patient endorses pain " at the knuckle".   Patient has taken Tylenol and Ibuprofen with no relief of symptoms.

## 2021-04-15 NOTE — Discharge Instructions (Addendum)
Your x-ray is normal.  Take Tylenol or ibuprofen as needed for discomfort.  Schedule a follow-up appointment with an orthopedic hand specialist such as the one listed below.

## 2021-04-23 DIAGNOSIS — M25561 Pain in right knee: Secondary | ICD-10-CM | POA: Diagnosis not present

## 2021-05-08 ENCOUNTER — Other Ambulatory Visit: Payer: Self-pay

## 2021-05-08 DIAGNOSIS — M79671 Pain in right foot: Secondary | ICD-10-CM | POA: Diagnosis not present

## 2021-05-08 DIAGNOSIS — M7751 Other enthesopathy of right foot: Secondary | ICD-10-CM | POA: Diagnosis not present

## 2021-05-08 DIAGNOSIS — M19071 Primary osteoarthritis, right ankle and foot: Secondary | ICD-10-CM | POA: Diagnosis not present

## 2021-05-08 MED ORDER — MELOXICAM 7.5 MG PO TABS
ORAL_TABLET | ORAL | 0 refills | Status: DC
  Filled 2021-05-08: qty 28, 14d supply, fill #0

## 2021-05-09 ENCOUNTER — Other Ambulatory Visit: Payer: Self-pay

## 2021-05-09 ENCOUNTER — Other Ambulatory Visit: Payer: Self-pay | Admitting: Family Medicine

## 2021-05-09 DIAGNOSIS — I890 Lymphedema, not elsewhere classified: Secondary | ICD-10-CM

## 2021-05-09 DIAGNOSIS — R600 Localized edema: Secondary | ICD-10-CM

## 2021-05-09 NOTE — Telephone Encounter (Signed)
Notes to clinic:  Requested scripts are expired  Review for continued use    Requested Prescriptions  Pending Prescriptions Disp Refills   potassium chloride (KLOR-CON) 10 MEQ tablet 30 tablet 3    Sig: TAKE 1 TABLET BY MOUTH DAILY.      Endocrinology:  Minerals - Potassium Supplementation Failed - 05/09/2021  1:12 PM      Failed - K in normal range and within 360 days    Potassium  Date Value Ref Range Status  03/09/2020 3.5 3.5 - 5.1 mmol/L Final  01/14/2013 3.6 3.5 - 5.1 mmol/L Final          Failed - Cr in normal range and within 360 days    Creat  Date Value Ref Range Status  07/08/2018 0.67 0.50 - 1.10 mg/dL Final   Creatinine, Ser  Date Value Ref Range Status  03/09/2020 0.64 0.44 - 1.00 mg/dL Final          Passed - Valid encounter within last 12 months    Recent Outpatient Visits           5 months ago Generalized anxiety disorder with panic attacks   [place], DO   11 months ago Left lower quadrant abdominal pain   [place], DO   11 months ago OSA (obstructive sleep apnea)   [facility] Olin Hauser, DO   1 year ago Morbid obesity with BMI of 50.0-59.9, adult [facility])   [facility] Olin Hauser, DO   1 year ago Suspected sleep apnea   [place], DO                  furosemide (LASIX) 40 MG tablet 45 tablet 3    Sig: TAKE 1 TABLET BY MOUTH DAILY AS NEEDED FOR FLUID OR EDEMA. MAY TAKE A 2ND DOSE IN 24 HOURS IF NEEDED.      Cardiovascular:  Diuretics - Loop Failed - 05/09/2021  1:12 PM      Failed - K in normal range and within 360 days    Potassium  Date Value Ref Range Status  03/09/2020 3.5 3.5 - 5.1 mmol/L Final  01/14/2013 3.6 3.5 - 5.1 mmol/L Final          Failed - Ca in normal range and within 360 days    Calcium  Date Value Ref Range Status  03/09/2020  8.4 (L) 8.9 - 10.3 mg/dL Final   Calcium, Total  Date Value Ref Range Status  01/14/2013 8.6 8.5 - 10.1 mg/dL Final          Failed - Na in normal range and within 360 days    Sodium  Date Value Ref Range Status  03/09/2020 138 135 - 145 mmol/L Final  12/10/2017 140 134 - 144 mmol/L Final  01/14/2013 139 136 - 145 mmol/L Final          Failed - Cr in normal range and within 360 days    Creat  Date Value Ref Range Status  07/08/2018 0.67 0.50 - 1.10 mg/dL Final   Creatinine, Ser  Date Value Ref Range Status  03/09/2020 0.64 0.44 - 1.00 mg/dL Final          Passed - Last BP in normal range    BP Readings from Last 1 Encounters:  04/15/21 128/88  Passed - Valid encounter within last 6 months    Recent Outpatient Visits           5 months ago Generalized anxiety disorder with panic attacks   [place], DO   11 months ago Left lower quadrant abdominal pain   [place], DO   11 months ago OSA (obstructive sleep apnea)   [place], DO   1 year ago Morbid obesity with BMI of 50.0-59.9, adult [facility])   [place], DO   1 year ago Suspected sleep apnea   [place], [place]

## 2021-05-10 ENCOUNTER — Other Ambulatory Visit: Payer: Self-pay

## 2021-05-10 ENCOUNTER — Other Ambulatory Visit: Payer: Self-pay | Admitting: Family Medicine

## 2021-05-10 DIAGNOSIS — I890 Lymphedema, not elsewhere classified: Secondary | ICD-10-CM

## 2021-05-10 DIAGNOSIS — R600 Localized edema: Secondary | ICD-10-CM

## 2021-05-10 MED FILL — Potassium Chloride Tab ER 10 mEq: ORAL | 30 days supply | Qty: 30 | Fill #0 | Status: AC

## 2021-05-10 MED FILL — Furosemide Tab 40 MG: ORAL | 23 days supply | Qty: 45 | Fill #0 | Status: AC

## 2021-05-10 NOTE — Telephone Encounter (Signed)
Notes to clinic:  scripts requested are expired  Review for  continued use and refill    Requested Prescriptions  Pending Prescriptions Disp Refills   furosemide (LASIX) 40 MG tablet 45 tablet 3    Sig: TAKE 1 TABLET BY MOUTH DAILY AS NEEDED FOR FLUID OR EDEMA. MAY TAKE A 2ND DOSE IN 24 HOURS IF NEEDED.      Cardiovascular:  Diuretics - Loop Failed - 05/10/2021  8:47 AM      Failed - K in normal range and within 360 days    Potassium  Date Value Ref Range Status  03/09/2020 3.5 3.5 - 5.1 mmol/L Final  01/14/2013 3.6 3.5 - 5.1 mmol/L Final          Failed - Ca in normal range and within 360 days    Calcium  Date Value Ref Range Status  03/09/2020 8.4 (L) 8.9 - 10.3 mg/dL Final   Calcium, Total  Date Value Ref Range Status  01/14/2013 8.6 8.5 - 10.1 mg/dL Final          Failed - Na in normal range and within 360 days    Sodium  Date Value Ref Range Status  03/09/2020 138 135 - 145 mmol/L Final  12/10/2017 140 134 - 144 mmol/L Final  01/14/2013 139 136 - 145 mmol/L Final          Failed - Cr in normal range and within 360 days    Creat  Date Value Ref Range Status  07/08/2018 0.67 0.50 - 1.10 mg/dL Final   Creatinine, Ser  Date Value Ref Range Status  03/09/2020 0.64 0.44 - 1.00 mg/dL Final          Passed - Last BP in normal range    BP Readings from Last 1 Encounters:  04/15/21 128/88          Passed - Valid encounter within last 6 months    Recent Outpatient Visits           5 months ago Generalized anxiety disorder with panic attacks   [facility] Olin Hauser, DO   11 months ago Left lower quadrant abdominal pain   [facility] Olin Hauser, DO   11 months ago OSA (obstructive sleep apnea)   [facility] Olin Hauser, DO   1 year ago Morbid obesity with BMI of 50.0-59.9, adult [facility])   [facility] Olin Hauser, DO   1 year ago Suspected  sleep apnea   [place], DO                  potassium chloride (KLOR-CON) 10 MEQ tablet 30 tablet 3    Sig: TAKE 1 TABLET BY MOUTH DAILY.      Endocrinology:  Minerals - Potassium Supplementation Failed - 05/10/2021  8:47 AM      Failed - K in normal range and within 360 days    Potassium  Date Value Ref Range Status  03/09/2020 3.5 3.5 - 5.1 mmol/L Final  01/14/2013 3.6 3.5 - 5.1 mmol/L Final          Failed - Cr in normal range and within 360 days    Creat  Date Value Ref Range Status  07/08/2018 0.67 0.50 - 1.10 mg/dL Final   Creatinine, Ser  Date Value Ref Range Status  03/09/2020 0.64 0.44 - 1.00 mg/dL Final  Passed - Valid encounter within last 12 months    Recent Outpatient Visits           5 months ago Generalized anxiety disorder with panic attacks   [place], DO   11 months ago Left lower quadrant abdominal pain   [place], DO   11 months ago OSA (obstructive sleep apnea)   [place], DO   1 year ago Morbid obesity with BMI of 50.0-59.9, adult [facility])   [place], DO   1 year ago Suspected sleep apnea   [place], [place]

## 2021-05-15 ENCOUNTER — Other Ambulatory Visit: Payer: Self-pay

## 2021-05-23 DIAGNOSIS — M25561 Pain in right knee: Secondary | ICD-10-CM | POA: Diagnosis not present

## 2021-05-24 ENCOUNTER — Other Ambulatory Visit: Payer: Self-pay

## 2021-05-24 ENCOUNTER — Emergency Department: Payer: 59

## 2021-05-24 ENCOUNTER — Emergency Department
Admission: EM | Admit: 2021-05-24 | Discharge: 2021-05-24 | Disposition: A | Discharge status: Home / Self Care | Payer: 59 | Attending: Emergency Medicine | Admitting: Emergency Medicine

## 2021-05-24 DIAGNOSIS — R079 Chest pain, unspecified: Secondary | ICD-10-CM | POA: Diagnosis not present

## 2021-05-24 DIAGNOSIS — Z91040 Latex allergy status: Secondary | ICD-10-CM | POA: Diagnosis not present

## 2021-05-24 DIAGNOSIS — R0789 Other chest pain: Secondary | ICD-10-CM | POA: Diagnosis not present

## 2021-05-24 DIAGNOSIS — M546 Pain in thoracic spine: Secondary | ICD-10-CM | POA: Diagnosis not present

## 2021-05-24 DIAGNOSIS — Z20822 Contact with and (suspected) exposure to covid-19: Secondary | ICD-10-CM | POA: Diagnosis not present

## 2021-05-24 DIAGNOSIS — I509 Heart failure, unspecified: Secondary | ICD-10-CM | POA: Insufficient documentation

## 2021-05-24 DIAGNOSIS — M542 Cervicalgia: Secondary | ICD-10-CM | POA: Insufficient documentation

## 2021-05-24 DIAGNOSIS — Z87891 Personal history of nicotine dependence: Secondary | ICD-10-CM | POA: Diagnosis not present

## 2021-05-24 DIAGNOSIS — R6884 Jaw pain: Secondary | ICD-10-CM | POA: Insufficient documentation

## 2021-05-24 DIAGNOSIS — G4733 Obstructive sleep apnea (adult) (pediatric): Secondary | ICD-10-CM | POA: Diagnosis not present

## 2021-05-24 DIAGNOSIS — Z5321 Procedure and treatment not carried out due to patient leaving prior to being seen by health care provider: Secondary | ICD-10-CM | POA: Insufficient documentation

## 2021-05-24 HISTORY — DX: Heart failure, unspecified: I50.9

## 2021-05-24 LAB — CBC
HCT: 36 % (ref 36.0–46.0)
Hemoglobin: 12.1 g/dL (ref 12.0–15.0)
MCH: 28.9 pg (ref 26.0–34.0)
MCHC: 33.6 g/dL (ref 30.0–36.0)
MCV: 85.9 fL (ref 80.0–100.0)
Platelets: 448 10*3/uL — ABNORMAL HIGH (ref 150–400)
RBC: 4.19 MIL/uL (ref 3.87–5.11)
RDW: 13.4 % (ref 11.5–15.5)
WBC: 8.6 10*3/uL (ref 4.0–10.5)
nRBC: 0 % (ref 0.0–0.2)

## 2021-05-24 LAB — BASIC METABOLIC PANEL
Anion gap: 7 (ref 5–15)
BUN: 10 mg/dL (ref 6–20)
CO2: 27 mmol/L (ref 22–32)
Calcium: 9.1 mg/dL (ref 8.9–10.3)
Chloride: 103 mmol/L (ref 98–111)
Creatinine, Ser: 0.57 mg/dL (ref 0.44–1.00)
GFR, Estimated: >60 mL/min (ref >60)
Glucose, Bld: 109 mg/dL — ABNORMAL HIGH (ref 70–99)
Potassium: 3 mmol/L — ABNORMAL LOW (ref 3.5–5.1)
Sodium: 137 mmol/L (ref 135–145)

## 2021-05-24 LAB — TROPONIN I (HIGH SENSITIVITY): Troponin I (High Sensitivity): 9 ng/L (ref <18)

## 2021-05-24 LAB — BRAIN NATRIURETIC PEPTIDE: B Natriuretic Peptide: 21.6 pg/mL (ref 0.0–100.0)

## 2021-05-24 NOTE — ED Notes (Signed)
[place], EDT attempted blood draw for 2nd troponin without success; pt refuses 2nd attempt at this time

## 2021-05-24 NOTE — ED Triage Notes (Signed)
Pt states she has hx of CHF, pt states for the past few days she has had intermittent chest pain and been under a lot of stress. Pt states tonight she began to have upper back, neck and jaw pain.

## 2021-05-29 ENCOUNTER — Other Ambulatory Visit: Payer: Self-pay | Admitting: Family Medicine

## 2021-05-29 ENCOUNTER — Other Ambulatory Visit: Payer: Self-pay

## 2021-05-29 ENCOUNTER — Encounter: Payer: Self-pay | Admitting: Family Medicine

## 2021-05-29 ENCOUNTER — Ambulatory Visit: Payer: 59 | Admitting: Family Medicine

## 2021-05-29 VITALS — BP 145/88 | HR 84 | Ht 60.0 in | Wt 271.4 lb

## 2021-05-29 DIAGNOSIS — E559 Vitamin D deficiency, unspecified: Secondary | ICD-10-CM | POA: Diagnosis not present

## 2021-05-29 DIAGNOSIS — Z6841 Body Mass Index (BMI) 40.0 and over, adult: Secondary | ICD-10-CM

## 2021-05-29 DIAGNOSIS — Z1159 Encounter for screening for other viral diseases: Secondary | ICD-10-CM

## 2021-05-29 DIAGNOSIS — Z0000 Encounter for general adult medical examination without abnormal findings: Secondary | ICD-10-CM

## 2021-05-29 DIAGNOSIS — E538 Deficiency of other specified B group vitamins: Secondary | ICD-10-CM

## 2021-05-29 DIAGNOSIS — I10 Essential (primary) hypertension: Secondary | ICD-10-CM

## 2021-05-29 DIAGNOSIS — R7303 Prediabetes: Secondary | ICD-10-CM

## 2021-05-29 MED ORDER — WEGOVY 0.25 MG/0.5ML ~~LOC~~ SOAJ
0.2500 mg | SUBCUTANEOUS | 0 refills | Status: DC
  Filled 2021-05-29: qty 2, fill #0

## 2021-05-29 NOTE — Progress Notes (Signed)
Subjective:    Patient ID: Stacey Taylor, female    DOB: [birth year], 40 y.o.   MRN: [id number]  Stacey Taylor is a 40 y.o. female presenting on 05/29/2021 for Annual Exam and Weight Check   HPI  Here for Annual Physical and Lab Review  Morbid Obesity BMI >53 Lifestyle goals to improve weight loss in future, interested in medication again. Previously on sample of Wegovy in past.  No new concerns.  Additionally  Recently in ED due to chest tightness sharp pains, and post covid syndrome since May 2022. She felt anxiety and panic, had labs and CXR EKG unremarkable.  Health Maintenance: COVID19 vaccine x 2 initial. No booster, declined.  Age 47, not yet due for mammogram screening, start age 40+   Depression screen [facility] 2/9 05/29/2021 03/28/2020 03/13/2020  Decreased Interest 0 0 0  Down, Depressed, Hopeless 0 0 0  PHQ - 2 Score 0 0 0  Altered sleeping 0 - -  Tired, decreased energy 1 - -  Change in appetite 1 - -  Feeling bad or failure about yourself  0 - -  Trouble concentrating 0 - -  Moving slowly or fidgety/restless 0 - -  Suicidal thoughts 0 - -  PHQ-9 Score 2 - -  Difficult doing work/chores Not difficult at all - -    Past Medical History:  Diagnosis Date   Abnormal Pap smear of anus    HGSIL   Anemia    Back pain    Cervical dysplasia    CIN III   CHF (congestive heart failure) ([place])    Endometriosis    Gallbladder problem    Headache    Heart murmur    Leg edema    Premature delivery    x2   Past Surgical History:  Procedure Laterality Date   ABDOMINAL HYSTERECTOMY     CERVICAL BIOPSY  W/ LOOP ELECTRODE EXCISION  11/2005   CESAREAN SECTION  2008/2015   CESAREAN SECTION WITH BILATERAL TUBAL LIGATION  2015   CHOLECYSTECTOMY  2003   COLPOSCOPY  2005, 2006   COMBINED HYSTEROSCOPY DIAGNOSTIC / D&C  2014   CYSTOSCOPY N/A 03/08/2020   Procedure: CYSTOSCOPY;  Surgeon: Malachy Mood, MD;  Location: [facility] ORS;  Service: Gynecology;  Laterality: N/A;    LAPAROSCOPIC SUPRACERVICAL HYSTERECTOMY N/A 03/08/2020   Procedure: LAPAROSCOPIC SUPRACERVICAL HYSTERECTOMY WITH BILATERAL SALPINGECTOMY;  Surgeon: Malachy Mood, MD;  Location: [facility] ORS;  Service: Gynecology;  Laterality: N/A;   Social History   Socioeconomic History   Marital status: Married    Spouse name: Christia Reading   Number of children: 5   Years of education: Not on file   Highest education level: Not on file  Occupational History   Occupation: CNA  Tobacco Use   Smoking status: Never   Smokeless tobacco: Never  Vaping Use   Vaping Use: Never used  Substance and Sexual Activity   Alcohol use: No    Alcohol/week: 0.0 standard drinks   Drug use: No   Sexual activity: Not Currently    Birth control/protection: Surgical    Comment: Tubal ligation  Other Topics Concern   Not on file  Social History Narrative   Not on file   Social Determinants of Health   Financial Resource Strain: Not on file  Food Insecurity: Not on file  Transportation Needs: Not on file  Physical Activity: Not on file  Stress: Not on file  Social Connections: Not on file  Intimate Partner Violence: Not  on file   Family History  Adopted: Yes  Family history unknown: Yes   Current Outpatient Medications on File Prior to Visit  Medication Sig   escitalopram (LEXAPRO) 10 MG tablet START WITH 1/2 TABLET ('5MG'$ ) BY MOUTH DAILY FOR FIRST 2-4 WEEKS, THEN INCREASE TO 1 TABLET WHEN READY   furosemide (LASIX) 40 MG tablet TAKE 1 TABLET BY MOUTH DAILY AS NEEDED FOR FLUID OR EDEMA. MAY TAKE A 2ND DOSE IN 24 HOURS IF NEEDED.   meloxicam (MOBIC) 7.5 MG tablet Take 1 table by mouth twice a day with food   ondansetron (ZOFRAN-ODT) 4 MG disintegrating tablet Take 1 tablet (4 mg total) by mouth every 8 (eight) hours as needed for nausea or vomiting.   potassium chloride (KLOR-CON) 10 MEQ tablet TAKE 1 TABLET BY MOUTH DAILY.   [DISCONTINUED] loratadine (CLARITIN) 10 MG tablet Take 10 mg by mouth daily.   No  current facility-administered medications on file prior to visit.    Review of Systems  Constitutional:  Negative for activity change, appetite change, chills, diaphoresis, fatigue and fever.  HENT:  Negative for congestion and hearing loss.   Eyes:  Negative for visual disturbance.  Respiratory:  Negative for cough, chest tightness, shortness of breath and wheezing.   Cardiovascular:  Negative for chest pain, palpitations and leg swelling.  Gastrointestinal:  Negative for abdominal pain, constipation, diarrhea, nausea and vomiting.  Genitourinary:  Negative for dysuria, frequency and hematuria.  Musculoskeletal:  Negative for arthralgias and neck pain.  Skin:  Negative for rash.  Neurological:  Negative for dizziness, weakness, light-headedness, numbness and headaches.  Hematological:  Negative for adenopathy.  Psychiatric/Behavioral:  Negative for behavioral problems, dysphoric mood and sleep disturbance.   Per HPI unless specifically indicated above      Objective:    BP (!) 145/88   Pulse 84   Ht 5' (1.524 m)   Wt 271 lb 6.4 oz (123.1 kg)   LMP 01/06/2020   SpO2 98%   BMI 53.00 kg/m   Wt Readings from Last 3 Encounters:  05/29/21 271 lb 6.4 oz (123.1 kg)  05/24/21 280 lb (127 kg)  01/04/21 271 lb (122.9 kg)    Physical Exam Vitals and nursing note reviewed.  Constitutional:      General: She is not in acute distress.    Appearance: She is well-developed. She is obese. She is not diaphoretic.     Comments: Well-appearing, comfortable, cooperative  HENT:     Head: Normocephalic and atraumatic.  Eyes:     General:        Right eye: No discharge.        Left eye: No discharge.     Conjunctiva/sclera: Conjunctivae normal.     Pupils: Pupils are equal, round, and reactive to light.  Neck:     Thyroid: No thyromegaly.  Cardiovascular:     Rate and Rhythm: Normal rate and regular rhythm.     Pulses: Normal pulses.     Heart sounds: Normal heart sounds. No murmur  heard. Pulmonary:     Effort: Pulmonary effort is normal. No respiratory distress.     Breath sounds: Normal breath sounds. No wheezing or rales.  Abdominal:     General: Bowel sounds are normal. There is no distension.     Palpations: Abdomen is soft. There is no mass.     Tenderness: There is no abdominal tenderness.  Musculoskeletal:        General: No tenderness. Normal range of motion.  Cervical back: Normal range of motion and neck supple.     Right lower leg: No edema.     Left lower leg: No edema.     Comments: Upper / Lower Extremities: - Normal muscle tone, strength bilateral upper extremities 5/5, lower extremities 5/5  Lymphadenopathy:     Cervical: No cervical adenopathy.  Skin:    General: Skin is warm and dry.     Findings: No erythema or rash.  Neurological:     Mental Status: She is alert and oriented to person, place, and time.     Comments: Distal sensation intact to light touch all extremities  Psychiatric:        Mood and Affect: Mood normal.        Behavior: Behavior normal.        Thought Content: Thought content normal.     Comments: Well groomed, good eye contact, normal speech and thoughts      Results for orders placed or performed during the hospital encounter of [id number]  Basic metabolic panel  Result Value Ref Range   Sodium 137 135 - 145 mmol/L   Potassium 3.0 (L) 3.5 - 5.1 mmol/L   Chloride 103 98 - 111 mmol/L   CO2 27 22 - 32 mmol/L   Glucose, Bld 109 (H) 70 - 99 mg/dL   BUN 10 6 - 20 mg/dL   Creatinine, Ser 0.57 0.44 - 1.00 mg/dL   Calcium 9.1 8.9 - 10.3 mg/dL   GFR, Estimated >60 >60 mL/min   Anion gap 7 5 - 15  CBC  Result Value Ref Range   WBC 8.6 4.0 - 10.5 K/uL   RBC 4.19 3.87 - 5.11 MIL/uL   Hemoglobin 12.1 12.0 - 15.0 g/dL   HCT 36.0 36.0 - 46.0 %   MCV 85.9 80.0 - 100.0 fL   MCH 28.9 26.0 - 34.0 pg   MCHC 33.6 30.0 - 36.0 g/dL   RDW 13.4 11.5 - 15.5 %   Platelets 448 (H) 150 - 400 K/uL   nRBC 0.0 0.0 - 0.2 %  Brain  natriuretic peptide  Result Value Ref Range   B Natriuretic Peptide 21.6 0.0 - 100.0 pg/mL  Troponin I (High Sensitivity)  Result Value Ref Range   Troponin I (High Sensitivity) 9 <18 ng/L      Assessment & Plan:   Problem List Items Addressed This Visit     Vitamin D deficiency   Relevant Orders   VITAMIN D 25 Hydroxy (Vit-D Deficiency, Fractures)   Vitamin B12 deficiency   Relevant Orders   Vitamin B12   Pre-diabetes   Relevant Orders   Hemoglobin A1c   Morbid obesity with BMI of 50.0-59.9, adult (HCC)   Relevant Orders   Lipid panel   Comprehensive metabolic panel   TSH   Essential hypertension   Relevant Orders   CBC with Differential/Platelet   Comprehensive metabolic panel   TSH   Other Visit Diagnoses     Annual physical exam    -  Primary   Relevant Orders   Lipid panel   CBC with Differential/Platelet   Hemoglobin A1c   Comprehensive metabolic panel   TSH   Need for hepatitis C screening test       Relevant Orders   Hepatitis C antibody       Updated Health Maintenance information Fasting labs ordered, [facility] she can get orders completed soon Encouraged improvement to lifestyle with diet and exercise Goal of weight loss. If interested  can do sample trial again on Saxenda, Wegovy determined to be out of stock / back order for months at [facility].  Orders Placed This Encounter  Procedures   Lipid panel    Order Specific Question:   Has the patient fasted?    Answer:   Yes   CBC with Differential/Platelet   Hemoglobin A1c   Comprehensive metabolic panel    Order Specific Question:   Has the patient fasted?    Answer:   Yes   Vitamin B12   VITAMIN D 25 Hydroxy (Vit-D Deficiency, Fractures)   TSH   Hepatitis C antibody     No orders of the defined types were placed in this encounter.    Follow up plan: Return in about 3 months (around 08/29/2021) for 3 month follow-up Weight Management.  Nobie Putnam, [facility] 05/29/2021, 1:58 PM

## 2021-05-29 NOTE — Patient Instructions (Addendum)
Thank you for coming to the office today.  [facility] orders today.  Saxenda sample, daily dose, increase weekly, sample is good for 3 weeks.  Please schedule a Follow-up Appointment to: Return in about 3 months (around 08/29/2021) for 3 month follow-up Weight Management.  If you have any other questions or concerns, please feel free to call the office or send a message through [place]. You may also schedule an earlier appointment if necessary.  Additionally, you may be receiving a survey about your experience at our office within a few days to 1 week by e-mail or mail. We value your feedback.  Nobie Putnam, DO [place]

## 2021-06-05 DIAGNOSIS — I491 Atrial premature depolarization: Secondary | ICD-10-CM | POA: Diagnosis not present

## 2021-06-05 DIAGNOSIS — R0602 Shortness of breath: Secondary | ICD-10-CM | POA: Diagnosis not present

## 2021-06-05 DIAGNOSIS — R0789 Other chest pain: Secondary | ICD-10-CM | POA: Diagnosis not present

## 2021-06-05 DIAGNOSIS — R42 Dizziness and giddiness: Secondary | ICD-10-CM | POA: Diagnosis not present

## 2021-06-13 DIAGNOSIS — R7303 Prediabetes: Secondary | ICD-10-CM | POA: Diagnosis not present

## 2021-06-13 DIAGNOSIS — Z1159 Encounter for screening for other viral diseases: Secondary | ICD-10-CM | POA: Diagnosis not present

## 2021-06-13 DIAGNOSIS — Z6841 Body Mass Index (BMI) 40.0 and over, adult: Secondary | ICD-10-CM | POA: Diagnosis not present

## 2021-06-13 DIAGNOSIS — Z0000 Encounter for general adult medical examination without abnormal findings: Secondary | ICD-10-CM | POA: Diagnosis not present

## 2021-06-13 DIAGNOSIS — I10 Essential (primary) hypertension: Secondary | ICD-10-CM | POA: Diagnosis not present

## 2021-06-13 DIAGNOSIS — E559 Vitamin D deficiency, unspecified: Secondary | ICD-10-CM | POA: Diagnosis not present

## 2021-06-13 DIAGNOSIS — E538 Deficiency of other specified B group vitamins: Secondary | ICD-10-CM | POA: Diagnosis not present

## 2021-06-14 LAB — CBC WITH DIFFERENTIAL/PLATELET
Basophils Absolute: 0 10*3/uL (ref 0.0–0.2)
Basos: 1 %
EOS (ABSOLUTE): 0.1 10*3/uL (ref 0.0–0.4)
Eos: 2 %
Hematocrit: 34.8 % (ref 34.0–46.6)
Hemoglobin: 11.7 g/dL (ref 11.1–15.9)
Immature Grans (Abs): 0 10*3/uL (ref 0.0–0.1)
Immature Granulocytes: 0 %
Lymphocytes Absolute: 2.5 10*3/uL (ref 0.7–3.1)
Lymphs: 35 %
MCH: 28 pg (ref 26.6–33.0)
MCHC: 33.6 g/dL (ref 31.5–35.7)
MCV: 83 fL (ref 79–97)
Monocytes Absolute: 0.4 10*3/uL (ref 0.1–0.9)
Monocytes: 6 %
Neutrophils Absolute: 4 10*3/uL (ref 1.4–7.0)
Neutrophils: 56 %
Platelets: 443 10*3/uL (ref 150–450)
RBC: 4.18 x10E6/uL (ref 3.77–5.28)
RDW: 13.2 % (ref 11.7–15.4)
WBC: 7.1 10*3/uL (ref 3.4–10.8)

## 2021-06-14 LAB — COMPREHENSIVE METABOLIC PANEL
ALT: 18 IU/L (ref 0–32)
AST: 17 IU/L (ref 0–40)
Albumin/Globulin Ratio: 1.2 (ref 1.2–2.2)
Albumin: 3.9 g/dL (ref 3.8–4.8)
Alkaline Phosphatase: 72 IU/L (ref 44–121)
BUN/Creatinine Ratio: 11 (ref 9–23)
BUN: 7 mg/dL (ref 6–20)
Bilirubin Total: 0.6 mg/dL (ref 0.0–1.2)
CO2: 23 mmol/L (ref 20–29)
Calcium: 9 mg/dL (ref 8.7–10.2)
Chloride: 101 mmol/L (ref 96–106)
Creatinine, Ser: 0.61 mg/dL (ref 0.57–1.00)
Globulin, Total: 3.3 g/dL (ref 1.5–4.5)
Glucose: 110 mg/dL — ABNORMAL HIGH (ref 65–99)
Potassium: 3.8 mmol/L (ref 3.5–5.2)
Sodium: 138 mmol/L (ref 134–144)
Total Protein: 7.2 g/dL (ref 6.0–8.5)
eGFR: 117 mL/min/{1.73_m2} (ref >59)

## 2021-06-14 LAB — LIPID PANEL
Chol/HDL Ratio: 3.5 ratio (ref 0.0–4.4)
Cholesterol, Total: 160 mg/dL (ref 100–199)
HDL: 46 mg/dL (ref >39)
LDL Chol Calc (NIH): 99 mg/dL (ref 0–99)
Triglycerides: 77 mg/dL (ref 0–149)
VLDL Cholesterol Cal: 15 mg/dL (ref 5–40)

## 2021-06-14 LAB — VITAMIN D 25 HYDROXY (VIT D DEFICIENCY, FRACTURES): Vit D, 25-Hydroxy: 21.8 ng/mL — ABNORMAL LOW (ref 30.0–100.0)

## 2021-06-14 LAB — HEMOGLOBIN A1C
Est. average glucose Bld gHb Est-mCnc: 120 mg/dL
Hgb A1c MFr Bld: 5.8 % — ABNORMAL HIGH (ref 4.8–5.6)

## 2021-06-14 LAB — TSH: TSH: 2.85 u[IU]/mL (ref 0.450–4.500)

## 2021-06-14 LAB — HEPATITIS C ANTIBODY: Hep C Virus Ab: <0.1 s/co ratio (ref 0.0–0.9)

## 2021-06-14 LAB — VITAMIN B12: Vitamin B-12: 204 pg/mL — ABNORMAL LOW (ref 232–1245)

## 2021-06-23 DIAGNOSIS — M25561 Pain in right knee: Secondary | ICD-10-CM | POA: Diagnosis not present

## 2021-06-24 DIAGNOSIS — G4733 Obstructive sleep apnea (adult) (pediatric): Secondary | ICD-10-CM | POA: Diagnosis not present

## 2021-06-26 DIAGNOSIS — R0602 Shortness of breath: Secondary | ICD-10-CM | POA: Diagnosis not present

## 2021-06-26 DIAGNOSIS — R0789 Other chest pain: Secondary | ICD-10-CM | POA: Diagnosis not present

## 2021-06-30 ENCOUNTER — Encounter: Payer: Self-pay | Admitting: Family Medicine

## 2021-07-01 ENCOUNTER — Other Ambulatory Visit: Payer: Self-pay

## 2021-07-01 MED ORDER — CYANOCOBALAMIN 1000 MCG/ML IJ SOLN
1000.0000 ug | INTRAMUSCULAR | 0 refills | Status: DC
  Filled 2021-07-01 – 2021-09-03 (×2): qty 4, 28d supply, fill #0

## 2021-07-15 ENCOUNTER — Other Ambulatory Visit: Payer: Self-pay

## 2021-07-24 DIAGNOSIS — M25561 Pain in right knee: Secondary | ICD-10-CM | POA: Diagnosis not present

## 2021-07-25 DIAGNOSIS — G4733 Obstructive sleep apnea (adult) (pediatric): Secondary | ICD-10-CM | POA: Diagnosis not present

## 2021-08-23 DIAGNOSIS — M25561 Pain in right knee: Secondary | ICD-10-CM | POA: Diagnosis not present

## 2021-08-24 DIAGNOSIS — G4733 Obstructive sleep apnea (adult) (pediatric): Secondary | ICD-10-CM | POA: Diagnosis not present

## 2021-08-27 ENCOUNTER — Ambulatory Visit
Admission: EM | Admit: 2021-08-27 | Discharge: 2021-08-27 | Disposition: A | Discharge status: Home / Self Care | Payer: 59 | Attending: Urgent Care | Admitting: Urgent Care

## 2021-08-27 ENCOUNTER — Encounter: Payer: Self-pay | Admitting: Emergency Medicine

## 2021-08-27 ENCOUNTER — Other Ambulatory Visit: Payer: Self-pay

## 2021-08-27 DIAGNOSIS — J3089 Other allergic rhinitis: Secondary | ICD-10-CM

## 2021-08-27 DIAGNOSIS — J329 Chronic sinusitis, unspecified: Secondary | ICD-10-CM

## 2021-08-27 MED ORDER — PREDNISONE 20 MG PO TABS: ORAL_TABLET | ORAL | 0 refills | Status: DC

## 2021-08-27 MED ORDER — AMOXICILLIN-POT CLAVULANATE 875-125 MG PO TABS: 1.0000 | ORAL_TABLET | Freq: Two times a day (BID) | ORAL | 0 refills | Status: DC

## 2021-08-27 NOTE — ED Triage Notes (Signed)
Pt c/o sinus pressure/pain, runny eye, and runny nose x 1 week

## 2021-08-27 NOTE — ED Provider Notes (Signed)
Renae Gloss   MRN: [id number] DOB: 01-09-1981  Subjective:   Stacey Taylor is a 40 y.o. female presenting for 1 week history of persistent or worsening sinus congestion, sinus pressure, sinus pain, postnasal drainage.  Patient has difficulty with her allergies, takes Claritin-D regularly, Flonase.  Has a sinus infection every year or every other year.  Denies cough, chest pain, shortness of breath or wheezing.  No current facility-administered medications for this encounter.  Current Outpatient Medications:    cyanocobalamin (,VITAMIN B-12,) 1000 MCG/ML injection, Inject 1 mL (1,000 mcg total) into the muscle once a week., Disp: 4 mL, Rfl: 0   escitalopram (LEXAPRO) 10 MG tablet, START WITH 1/2 TABLET (5MG ) BY MOUTH DAILY FOR FIRST 2-4 WEEKS, THEN INCREASE TO 1 TABLET WHEN READY, Disp: 30 tablet, Rfl: 2   furosemide (LASIX) 40 MG tablet, TAKE 1 TABLET BY MOUTH DAILY AS NEEDED FOR FLUID OR EDEMA. MAY TAKE A 2ND DOSE IN 24 HOURS IF NEEDED., Disp: 45 tablet, Rfl: 3   meloxicam (MOBIC) 7.5 MG tablet, Take 1 table by mouth twice a day with food, Disp: 28 tablet, Rfl: 0   ondansetron (ZOFRAN-ODT) 4 MG disintegrating tablet, Take 1 tablet (4 mg total) by mouth every 8 (eight) hours as needed for nausea or vomiting., Disp: 12 tablet, Rfl: 0   potassium chloride (KLOR-CON) 10 MEQ tablet, TAKE 1 TABLET BY MOUTH DAILY., Disp: 30 tablet, Rfl: 3   Allergies  Allergen Reactions   Latex Hives   Tramadol     Chest pain    Past Medical History:  Diagnosis Date   Abnormal Pap smear of anus    HGSIL   Anemia    Back pain    Cervical dysplasia    CIN III   CHF (congestive heart failure) (HCC)    Endometriosis    Gallbladder problem    Headache    Heart murmur    Leg edema    Premature delivery    x2     Past Surgical History:  Procedure Laterality Date   ABDOMINAL HYSTERECTOMY     CERVICAL BIOPSY  W/ LOOP ELECTRODE EXCISION  11/2005   CESAREAN SECTION  2008/2015    CESAREAN SECTION WITH BILATERAL TUBAL LIGATION  2015   CHOLECYSTECTOMY  2003   COLPOSCOPY  2005, 2006   COMBINED HYSTEROSCOPY DIAGNOSTIC / D&C  2014   CYSTOSCOPY N/A 03/08/2020   Procedure: CYSTOSCOPY;  Surgeon: Malachy Mood, MD;  Location: [facility] ORS;  Service: Gynecology;  Laterality: N/A;   LAPAROSCOPIC SUPRACERVICAL HYSTERECTOMY N/A 03/08/2020   Procedure: LAPAROSCOPIC SUPRACERVICAL HYSTERECTOMY WITH BILATERAL SALPINGECTOMY;  Surgeon: Malachy Mood, MD;  Location: [facility] OR[facility];  Service: Gynecology;  Laterality: N/A;    Family History  Adopted: Yes  Family history unknown: Yes    Social History   Tobacco Use   Smoking status: Never   Smokeless tobacco: Never  Vaping Use   Vaping Use: Never used  Substance Use Topics   Alcohol use: No    Alcohol/week: 0.0 standard drinks   Drug use: No    ROS   Objective:   Vitals: BP (!) 143/94 (BP Location: Left Wrist)   Pulse (!) 101   Temp 98.2 F (36.8 C) (Oral)   LMP 01/06/2020   SpO2 97%   Physical Exam Constitutional:      General: She is not in acute distress.    Appearance: Normal appearance. She is well-developed. She is not ill-appearing, toxic-appearing or diaphoretic.  HENT:  Head: Normocephalic and atraumatic.     Right Ear: Tympanic membrane and ear canal normal. No drainage or tenderness. No middle ear effusion. Tympanic membrane is not erythematous.     Left Ear: Tympanic membrane and ear canal normal. No drainage or tenderness.  No middle ear effusion. Tympanic membrane is not erythematous.     Nose: Congestion and rhinorrhea present.     Mouth/Throat:     Mouth: Mucous membranes are moist. No oral lesions.     Pharynx: No pharyngeal swelling, oropharyngeal exudate, posterior oropharyngeal erythema or uvula swelling.     Tonsils: No tonsillar exudate or tonsillar abscesses.     Comments: Thick postnasal drainage overlying pharynx. Eyes:     Extraocular Movements: Extraocular movements intact.      Right eye: Normal extraocular motion.     Left eye: Normal extraocular motion.     Conjunctiva/sclera: Conjunctivae normal.     Pupils: Pupils are equal, round, and reactive to light.  Cardiovascular:     Rate and Rhythm: Normal rate and regular rhythm.     Pulses: Normal pulses.     Heart sounds: Normal heart sounds. No murmur heard.   No friction rub. No gallop.  Pulmonary:     Effort: Pulmonary effort is normal. No respiratory distress.     Breath sounds: Normal breath sounds. No stridor. No wheezing, rhonchi or rales.  Musculoskeletal:     Cervical back: Normal range of motion and neck supple.  Lymphadenopathy:     Cervical: No cervical adenopathy.  Skin:    General: Skin is warm and dry.     Findings: No rash.  Neurological:     General: No focal deficit present.     Mental Status: She is alert and oriented to person, place, and time.  Psychiatric:        Mood and Affect: Mood normal.        Behavior: Behavior normal.        Thought Content: Thought content normal.        Judgment: Judgment normal.    Assessment and Plan :   PDMP not reviewed this encounter.  1. Recurrent sinusitis   2. Allergic rhinitis due to other allergic trigger, unspecified seasonality    Will start empiric treatment for sinusitis with Augmentin, prednisone (in light of her allergic rhinitis).  Recommended supportive care otherwise including the continued use of oral antihistamine, decongestant.  Hold the nasal spray until after symptoms completely resolved.  Follow-up with ENT if symptoms persist.  Counseled patient on potential for adverse effects with medications prescribed/recommended today, ER and return-to-clinic precautions discussed, patient verbalized understanding.    Jaynee Eagles, [place] 08/27/21 [phone]

## 2021-09-02 ENCOUNTER — Ambulatory Visit (INDEPENDENT_AMBULATORY_CARE_PROVIDER_SITE_OTHER): Payer: 59

## 2021-09-02 ENCOUNTER — Other Ambulatory Visit: Payer: Self-pay

## 2021-09-02 VITALS — Wt 271.0 lb

## 2021-09-02 DIAGNOSIS — Z23 Encounter for immunization: Secondary | ICD-10-CM

## 2021-09-03 ENCOUNTER — Encounter: Payer: Self-pay | Admitting: Family Medicine

## 2021-09-03 ENCOUNTER — Other Ambulatory Visit: Payer: Self-pay

## 2021-09-03 ENCOUNTER — Ambulatory Visit: Payer: 59 | Admitting: Family Medicine

## 2021-09-03 VITALS — BP 143/87 | HR 97 | Ht 60.0 in | Wt 273.2 lb

## 2021-09-03 DIAGNOSIS — Z6841 Body Mass Index (BMI) 40.0 and over, adult: Secondary | ICD-10-CM

## 2021-09-03 MED ORDER — WEGOVY 1.7 MG/0.75ML ~~LOC~~ SOAJ: 1.7000 mg | SUBCUTANEOUS | 2 refills | Status: DC

## 2021-09-03 NOTE — Patient Instructions (Addendum)
Thank you for coming to the office today.  Ozempic (Semaglutide injection) - start 0.25mg  weekly for 4 weeks then increase to 0.5mg  weekly  - sample good for 6 weeks.  Sent Wegovy 1.7 mg weekly dose to [facility] will need approval first. Stay tuned.  Drop off paperwork for bariatrics  Please send a mychart message with a complete list of all attempted weight management strategies / programs / diet / medication   Please schedule a Follow-up Appointment to: Return in about 3 months (around 12/04/2021) for 3 month weight management.  If you have any other questions or concerns, please feel free to call the office or send a message through [place]. You may also schedule an earlier appointment if necessary.  Additionally, you may be receiving a survey about your experience at our office within a few days to 1 week by e-mail or mail. We value your feedback.  Nobie Putnam, DO [place]

## 2021-09-03 NOTE — Progress Notes (Signed)
Subjective:    Patient ID: Stacey Taylor, female    DOB: Nov 22, 1980, 40 y.o.   MRN: [id number]  Stacey Taylor is a 40 y.o. female presenting on 09/03/2021 for Weight Check   HPI  Morbid Obesity BMI >53 Lifestyle goals to improve weight loss in future, interested in medication again. Previously on sample of Wegovy in past. She has tried sample Saxenda for 3 weeks, no significant wt loss due to other factors, says fluid swelling weight. She admits appetite did get reduced.  Since having Sleep Apnea mask BiPAP mask seems improved. Gained wt with limitation from her knee more sedentary    Depression screen [facility] 2/9 05/29/2021 03/28/2020 03/13/2020  Decreased Interest 0 0 0  Down, Depressed, Hopeless 0 0 0  PHQ - 2 Score 0 0 0  Altered sleeping 0 - -  Tired, decreased energy 1 - -  Change in appetite 1 - -  Feeling bad or failure about yourself  0 - -  Trouble concentrating 0 - -  Moving slowly or fidgety/restless 0 - -  Suicidal thoughts 0 - -  PHQ-9 Score 2 - -  Difficult doing work/chores Not difficult at all - -    Social History   Tobacco Use   Smoking status: Never   Smokeless tobacco: Never  Vaping Use   Vaping Use: Never used  Substance Use Topics   Alcohol use: No    Alcohol/week: 0.0 standard drinks   Drug use: No    Review of Systems Per HPI unless specifically indicated above     Objective:    BP (!) 143/87   Pulse 97   Ht 5' (1.524 m)   Wt 273 lb 3.2 oz (123.9 kg)   LMP 01/06/2020   SpO2 100%   BMI 53.36 kg/m   Wt Readings from Last 3 Encounters:  09/03/21 273 lb 3.2 oz (123.9 kg)  09/02/21 271 lb (122.9 kg)  05/29/21 271 lb 6.4 oz (123.1 kg)    Physical Exam Vitals and nursing note reviewed.  Constitutional:      General: She is not in acute distress.    Appearance: Normal appearance. She is well-developed. She is obese. She is not diaphoretic.     Comments: Well-appearing, comfortable, cooperative  HENT:     Head: Normocephalic and  atraumatic.  Eyes:     General:        Right eye: No discharge.        Left eye: No discharge.     Conjunctiva/sclera: Conjunctivae normal.  Cardiovascular:     Rate and Rhythm: Normal rate.  Pulmonary:     Effort: Pulmonary effort is normal.  Skin:    General: Skin is warm and dry.     Findings: No erythema or rash.  Neurological:     Mental Status: She is alert and oriented to person, place, and time.  Psychiatric:        Mood and Affect: Mood normal.        Behavior: Behavior normal.        Thought Content: Thought content normal.     Comments: Well groomed, good eye contact, normal speech and thoughts     Results for orders placed or performed in visit on 05/29/21  Lipid panel  Result Value Ref Range   Cholesterol, Total 160 100 - 199 mg/dL   Triglycerides 77 0 - 149 mg/dL   HDL 46 >99 mg/dL   VLDL Cholesterol Cal 15 5 - 40  mg/dL   LDL Chol Calc (NIH) 99 0 - 99 mg/dL   Chol/HDL Ratio 3.5 0.0 - 4.4 ratio  CBC with Differential/Platelet  Result Value Ref Range   WBC 7.1 3.4 - 10.8 x10E3/uL   RBC 4.18 3.77 - 5.28 x10E6/uL   Hemoglobin 11.7 11.1 - 15.9 g/dL   Hematocrit 34.8 34.0 - 46.6 %   MCV 83 79 - 97 fL   MCH 28.0 26.6 - 33.0 pg   MCHC 33.6 31.5 - 35.7 g/dL   RDW 13.2 11.7 - 15.4 %   Platelets 443 150 - 450 x10E3/uL   Neutrophils 56 Not Estab. %   Lymphs 35 Not Estab. %   Monocytes 6 Not Estab. %   Eos 2 Not Estab. %   Basos 1 Not Estab. %   Neutrophils Absolute 4.0 1.4 - 7.0 x10E3/uL   Lymphocytes Absolute 2.5 0.7 - 3.1 x10E3/uL   Monocytes Absolute 0.4 0.1 - 0.9 x10E3/uL   EOS (ABSOLUTE) 0.1 0.0 - 0.4 x10E3/uL   Basophils Absolute 0.0 0.0 - 0.2 x10E3/uL   Immature Granulocytes 0 Not Estab. %   Immature Grans (Abs) 0.0 0.0 - 0.1 x10E3/uL  Hemoglobin A1c  Result Value Ref Range   Hgb A1c MFr Bld 5.8 (H) 4.8 - 5.6 %   Est. average glucose Bld gHb Est-mCnc 120 mg/dL  Comprehensive metabolic panel  Result Value Ref Range   Glucose 110 (H) 65 - 99 mg/dL    BUN 7 6 - 20 mg/dL   Creatinine, Ser 0.61 0.57 - 1.00 mg/dL   eGFR 117 >59 mL/min/1.73   BUN/Creatinine Ratio 11 9 - 23   Sodium 138 134 - 144 mmol/L   Potassium 3.8 3.5 - 5.2 mmol/L   Chloride 101 96 - 106 mmol/L   CO2 23 20 - 29 mmol/L   Calcium 9.0 8.7 - 10.2 mg/dL   Total Protein 7.2 6.0 - 8.5 g/dL   Albumin 3.9 3.8 - 4.8 g/dL   Globulin, Total 3.3 1.5 - 4.5 g/dL   Albumin/Globulin Ratio 1.2 1.2 - 2.2   Bilirubin Total 0.6 0.0 - 1.2 mg/dL   Alkaline Phosphatase 72 44 - 121 IU/L   AST 17 0 - 40 IU/L   ALT 18 0 - 32 IU/L  Vitamin B12  Result Value Ref Range   Vitamin B-12 204 (L) 232 - 1,245 pg/mL  VITAMIN D 25 Hydroxy (Vit-D Deficiency, Fractures)  Result Value Ref Range   Vit D, 25-Hydroxy 21.8 (L) 30.0 - 100.0 ng/mL  TSH  Result Value Ref Range   TSH 2.850 0.450 - 4.500 uIU/mL  Hepatitis C antibody  Result Value Ref Range   Hep C Virus Ab <0.1 0.0 - 0.9 s/co ratio      Assessment & Plan:   Problem List Items Addressed This Visit     Morbid obesity with BMI of 50.0-59.9, adult (HCC) - Primary   Relevant Medications   WEGOVY 1.7 MG/0.75ML SOAJ    Meds ordered this encounter  Medications   WEGOVY 1.7 MG/0.75ML SOAJ    Sig: Inject 1.7 mg into the skin once a week.    Dispense:  3 mL    Refill:  2   Complications with obesity Osteoarthritis, chronic knee pain Swelling edema Pred-iabetes   Trial on sample Ozempic for initial dosing 0.$RemoveBeforeDE'25mg'HHekkafCOdUxMHS$  weekly x 4 weeks then up to 0.5 weekly x 2 weeks, then can start with new rx Wegovy once PA approved 1.$RemoveBefor'7mg'jOeZInxeZXad$  weekly dose higher dose as instructed.  Counsel  on diet lifestyle regimen  Will pursue Bariatric Surgery referral to [facility], she has paperwork needs medical portion completed by our office with weights and also with attempted prior weight loss strategy she will drop off paperwork this week. Will return to patient once completed.   Follow up plan: Return in about 3 months (around 12/04/2021) for 3 month  weight management.   Nobie Putnam, [facility] 09/03/2021, 8:34 AM

## 2021-09-23 DIAGNOSIS — M25561 Pain in right knee: Secondary | ICD-10-CM | POA: Diagnosis not present

## 2021-09-24 DIAGNOSIS — G4733 Obstructive sleep apnea (adult) (pediatric): Secondary | ICD-10-CM | POA: Diagnosis not present

## 2021-10-10 DIAGNOSIS — L811 Chloasma: Secondary | ICD-10-CM | POA: Diagnosis not present

## 2021-10-24 ENCOUNTER — Other Ambulatory Visit: Payer: Self-pay | Admitting: Family Medicine

## 2021-10-24 ENCOUNTER — Ambulatory Visit: Payer: Self-pay

## 2021-10-24 DIAGNOSIS — Z6841 Body Mass Index (BMI) 40.0 and over, adult: Secondary | ICD-10-CM

## 2021-10-24 MED ORDER — WEGOVY 1.7 MG/0.75ML ~~LOC~~ SOAJ: 1.7000 mg | SUBCUTANEOUS | 2 refills | Status: DC

## 2021-10-24 NOTE — Telephone Encounter (Signed)
Pt called stating that she is not sure if she should continue taking the same medication dose that she has been for her Wegovy, or if that will continue to increase as she takes it. Please advise.   Left message to call back.

## 2021-10-24 NOTE — Telephone Encounter (Signed)
Medication Refill - Medication: Mancel Parsons  Has the patient contacted their pharmacy? Yes.   Pt states that she contacted the pharmacy and that she needs to contact PCP because they cannot automatically refill. Please advise.  (Agent: If no, request that the patient contact the pharmacy for the refill. If patient does not wish to contact the pharmacy document the reason why and proceed with request.) (Agent: If yes, when and what did the pharmacy advise?)  Preferred Pharmacy (with phone number or street name):  [id number] [address], [address]  [place] [address]  Phone: [phone] Fax: [phone]  Hours: Not open 24 hours   Has the patient been seen for an appointment in the last year OR does the patient have an upcoming appointment? Yes.    Agent: Please be advised that RX refills may take up to 3 business days. We ask that you follow-up with your pharmacy.

## 2021-10-24 NOTE — Telephone Encounter (Signed)
2nd attempt to contact patient  to review medication dose Wecovy. No answer, LMTCB [phone].

## 2021-10-24 NOTE — Telephone Encounter (Signed)
Please let her know:  The dosage is up to her actually.  I received a refill request today for the Wegovy 1.7 mg weekly inj dose that I refilled because I thought that she requested that dose.  As far as I know, there are still limitations on which doses are available at the pharmacy.  There is only one higher dose for Wegovy 2.4 mg weekly injection.  I am comfortable with whichever dose she prefers AND is able to get.  If she can confirm she can get the 2.4mg  dose and she would like to go up on the dose, I can send a new order.  If she is comfortable with staying at 1.7mg  that is fine.  Continuing medication at either dose would help her to continue to lose weight.  Nobie Putnam, [facility] 10/24/2021, 4:51 PM

## 2021-10-24 NOTE — Telephone Encounter (Signed)
Requested medication (s) are due for refill today:   Provider to review  Requested medication (s) are on the active medication list:   Yes  Future visit scheduled:   No   Last ordered: 09/03/2021 3 ml, 2 refills  Returned because pt called pharmacy about a refill and they told her this could not be automatically refilled she would have to notify her PCP.   I wasn't sure whether to approve it or not.     Requested Prescriptions  Pending Prescriptions Disp Refills   WEGOVY 1.7 MG/0.75ML SOAJ 3 mL 2    Sig: Inject 1.7 mg into the skin once a week.     Endocrinology:  Diabetes - GLP-1 Receptor Agonists Passed - 10/24/2021 11:42 AM      Passed - HBA1C is between 0 and 7.9 and within 180 days    Hgb A1c MFr Bld  Date Value Ref Range Status  06/13/2021 5.8 (H) 4.8 - 5.6 % Final    Comment:             Prediabetes: 5.7 - 6.4          Diabetes: >6.4          Glycemic control for adults with diabetes: <7.0           Passed - Valid encounter within last 6 months    Recent Outpatient Visits           1 month ago Morbid obesity with BMI of 50.0-59.9, adult [facility])   [place], DO   4 months ago Annual physical exam   [place], DO   11 months ago Generalized anxiety disorder with panic attacks   [place], DO   1 year ago Left lower quadrant abdominal pain   [place], DO   1 year ago OSA (obstructive sleep apnea)   [place], DO

## 2021-10-24 NOTE — Telephone Encounter (Signed)
Patient called, left VM to return the call to the office to speak to a nurse. Unable to reach patient after 3 attempts by [facility] NT, routing to the provider for resolution per protocol.    Summary: medication clarification   Pt called stating that she is not sure if she should continue taking the same medication dose that she has been for her Wegovy, or if that will continue to increase as she takes it. Please advise.

## 2021-10-29 NOTE — Telephone Encounter (Signed)
Called and left patient a detailed VM. Told her to call us back to let us know if she wants to stay on the current dose or go up. If she wants to increase the dose, Dr Raliegh Ip will still need to send this in.  Awaiting call back.

## 2021-11-11 ENCOUNTER — Ambulatory Visit (INDEPENDENT_AMBULATORY_CARE_PROVIDER_SITE_OTHER): Payer: 59 | Admitting: Obstetrics and Gynecology

## 2021-11-11 ENCOUNTER — Encounter: Payer: Self-pay | Admitting: Obstetrics and Gynecology

## 2021-11-11 ENCOUNTER — Other Ambulatory Visit (HOSPITAL_COMMUNITY)
Admission: RE | Admit: 2021-11-11 | Discharge: 2021-11-11 | Disposition: A | Discharge status: Home / Self Care | Payer: 59 | Source: Ambulatory Visit | Attending: Obstetrics and Gynecology | Admitting: Obstetrics and Gynecology

## 2021-11-11 ENCOUNTER — Other Ambulatory Visit: Payer: Self-pay

## 2021-11-11 VITALS — BP 143/98 | Ht 60.0 in | Wt 272.0 lb

## 2021-11-11 DIAGNOSIS — Z113 Encounter for screening for infections with a predominantly sexual mode of transmission: Secondary | ICD-10-CM | POA: Insufficient documentation

## 2021-11-11 DIAGNOSIS — Z124 Encounter for screening for malignant neoplasm of cervix: Secondary | ICD-10-CM | POA: Diagnosis not present

## 2021-11-11 DIAGNOSIS — N939 Abnormal uterine and vaginal bleeding, unspecified: Secondary | ICD-10-CM

## 2021-11-11 NOTE — Progress Notes (Signed)
Obstetrics & Gynecology Office Visit   Chief Complaint:  Chief Complaint  Patient presents with   Gynecologic Exam    Questions about past surgery and STD testing. RM 4    History of Present Illness: 41 y.o. [id number] status post laparoscopic supracervical hysterectomy on 03/08/2020 who presents with cyclical monthly light brown spotting.  Does correlate with moliminal symptoms.  Pap smear 11/18/2018 NILM HPV negative.    Patient is a 41 y.o. [id number] presents for STD testing.  The patient has not noted intermenstrual spotting,  has not experienced postcoital bleeding, and does not report increased vaginal discharge.  There is not a history of prior sexually transmitted infection(s).     The patient is sexually active.  Separated but has had sexual contact with her ex-husband .   Review of Systems: Review of Systems  Constitutional: Negative.   Gastrointestinal: Negative.   Genitourinary: Negative.     Past Medical History:  Past Medical History:  Diagnosis Date   Abnormal Pap smear of anus    HGSIL   Anemia    Back pain    Cervical dysplasia    CIN III   CHF (congestive heart failure) (HCC)    Endometriosis    Gallbladder problem    Headache    Heart murmur    Leg edema    Premature delivery    x2    Past Surgical History:  Past Surgical History:  Procedure Laterality Date   ABDOMINAL HYSTERECTOMY     CERVICAL BIOPSY  W/ LOOP ELECTRODE EXCISION  11/2005   CESAREAN SECTION  2008/2015   CESAREAN SECTION WITH BILATERAL TUBAL LIGATION  2015   CHOLECYSTECTOMY  2003   COLPOSCOPY  2005, 2006   COMBINED HYSTEROSCOPY DIAGNOSTIC / D&C  2014   CYSTOSCOPY N/A 03/08/2020   Procedure: CYSTOSCOPY;  Surgeon: Malachy Mood, MD;  Location: [facility] ORS;  Service: Gynecology;  Laterality: N/A;   LAPAROSCOPIC SUPRACERVICAL HYSTERECTOMY N/A 03/08/2020   Procedure: LAPAROSCOPIC SUPRACERVICAL HYSTERECTOMY WITH BILATERAL SALPINGECTOMY;  Surgeon: Malachy Mood, MD;  Location:  [facility] ORS;  Service: Gynecology;  Laterality: N/A;    Gynecologic History: Patient's last menstrual period was 01/06/2020.  Obstetric History: [id number]  Family History:  Family History  Adopted: Yes  Family history unknown: Yes    Social History:  Social History   Socioeconomic History   Marital status: Divorced    Spouse name: Timothy   Number of children: 5   Years of education: Not on file   Highest education level: Not on file  Occupational History   Occupation: CNA  Tobacco Use   Smoking status: Never   Smokeless tobacco: Never  Vaping Use   Vaping Use: Never used  Substance and Sexual Activity   Alcohol use: No    Alcohol/week: 0.0 standard drinks   Drug use: No   Sexual activity: Yes    Birth control/protection: Surgical    Comment: Tubal ligation  Other Topics Concern   Not on file  Social History Narrative   Not on file   Social Determinants of Health   Financial Resource Strain: Not on file  Food Insecurity: Not on file  Transportation Needs: Not on file  Physical Activity: Not on file  Stress: Not on file  Social Connections: Not on file  Intimate Partner Violence: Not on file    Allergies:  Allergies  Allergen Reactions   Latex Hives   Tramadol     Chest pain    Medications: Prior to  Admission medications   Medication Sig Start Date End Date Taking? Authorizing Provider  WEGOVY 1.7 MG/0.75ML SOAJ Inject 1.7 mg into the skin once a week. 10/24/21  Yes Karamalegos, Devonne Doughty, DO  cyanocobalamin (,VITAMIN B-12,) 1000 MCG/ML injection Inject 1 mL (1,000 mcg total) into the muscle once a week. Patient not taking: Reported on 11/11/2021 07/01/21   Jearld Fenton, NP  escitalopram (LEXAPRO) 10 MG tablet START WITH 1/2 TABLET (5MG ) BY MOUTH DAILY FOR FIRST 2-4 WEEKS, THEN INCREASE TO 1 TABLET WHEN READY Patient not taking: Reported on 11/11/2021 11/27/20 11/27/21  Olin Hauser, DO  furosemide (LASIX) 40 MG tablet TAKE 1 TABLET BY MOUTH  DAILY AS NEEDED FOR FLUID OR EDEMA. MAY TAKE A 2ND DOSE IN 24 HOURS IF NEEDED. Patient not taking: Reported on 11/11/2021 05/10/21   Olin Hauser, DO  meloxicam (MOBIC) 7.5 MG tablet Take 1 table by mouth twice a day with food Patient not taking: Reported on 11/11/2021 05/08/21     ondansetron (ZOFRAN-ODT) 4 MG disintegrating tablet Take 1 tablet (4 mg total) by mouth every 8 (eight) hours as needed for nausea or vomiting. 03/29/21   Noe Gens, PA-C  potassium chloride (KLOR-CON) 10 MEQ tablet TAKE 1 TABLET BY MOUTH DAILY. Patient not taking: Reported on 11/11/2021 05/10/21 05/10/22  Olin Hauser, DO  predniSONE (DELTASONE) 20 MG tablet Take 2 tablets daily with breakfast. Patient not taking: Reported on 11/11/2021 08/27/21   Jaynee Eagles, PA-C  loratadine (CLARITIN) 10 MG tablet Take 10 mg by mouth daily.  09/04/20  [provider]    Physical Exam Vitals:  Vitals:   11/11/21 1505  BP: (!) 143/98   Patient's last menstrual period was 01/06/2020.  General: NAD HEENT: normocephalic, anicteric Pulmonary: No increased work of breathing Genitourinary:  External: Normal external female genitalia.  Normal urethral meatus, normal  Bartholin's and Skene's glands.    Vagina: Normal vaginal mucosa, no evidence of prolapse.    Cervix: Grossly normal in appearance, minimal dark brown blood noted in vaginal vault  Uterus: surgically absent  Adnexa: ovaries non-enlarged, no adnexal masses  Rectal: deferred  Lymphatic: no evidence of inguinal lymphadenopathy Extremities: no edema, erythema, or tenderness Neurologic: Grossly intact Psychiatric: mood appropriate, affect full  Female chaperone present for pelvic  portions of the physical exam  Assessment: 41 y.o. [id number] spotting and STI testing  Plan: Problem List Items Addressed This Visit   None Visit Diagnoses     Screening for malignant neoplasm of cervix    -  Primary   Relevant Orders   Cytology - PAP   Abnormal  uterine bleeding       Routine screening for STI (sexually transmitted infection)       Relevant Orders   Cytology - PAP   HEP, RPR, HIV Panel      1) Regular cyclical spotting, not heavy.   - Pap and GC/CT/Trich collected - discussed occasional spotting may be seen s/p supracervical hysterectomy  2) Return in about 1 year (around 11/11/2022) for annual.   Malachy Mood, MD, [place], [facility] 11/11/2021, 3:21 PM

## 2021-11-13 LAB — CYTOLOGY - PAP
Adequacy: ABSENT
Chlamydia: NEGATIVE
Comment: NEGATIVE
Comment: NEGATIVE
Comment: NEGATIVE
Comment: NORMAL
Diagnosis: NEGATIVE
High risk HPV: NEGATIVE
Neisseria Gonorrhea: NEGATIVE
Trichomonas: NEGATIVE

## 2021-11-14 ENCOUNTER — Encounter: Payer: Self-pay | Admitting: Obstetrics and Gynecology

## 2021-12-02 ENCOUNTER — Other Ambulatory Visit: Payer: Self-pay

## 2021-12-02 MED ORDER — CARESTART COVID-19 HOME TEST VI KIT
PACK | 0 refills | Status: DC
  Filled 2021-12-02: qty 2, 4d supply, fill #0

## 2021-12-11 DIAGNOSIS — H5213 Myopia, bilateral: Secondary | ICD-10-CM | POA: Diagnosis not present

## 2021-12-11 DIAGNOSIS — H04123 Dry eye syndrome of bilateral lacrimal glands: Secondary | ICD-10-CM | POA: Diagnosis not present

## 2022-01-08 DIAGNOSIS — L821 Other seborrheic keratosis: Secondary | ICD-10-CM | POA: Diagnosis not present

## 2022-01-08 DIAGNOSIS — L578 Other skin changes due to chronic exposure to nonionizing radiation: Secondary | ICD-10-CM | POA: Diagnosis not present

## 2022-01-08 DIAGNOSIS — D225 Melanocytic nevi of trunk: Secondary | ICD-10-CM | POA: Diagnosis not present

## 2022-01-08 DIAGNOSIS — L811 Chloasma: Secondary | ICD-10-CM | POA: Diagnosis not present

## 2022-01-24 DIAGNOSIS — N951 Menopausal and female climacteric states: Secondary | ICD-10-CM | POA: Diagnosis not present

## 2022-01-24 DIAGNOSIS — E039 Hypothyroidism, unspecified: Secondary | ICD-10-CM | POA: Diagnosis not present

## 2022-01-28 ENCOUNTER — Other Ambulatory Visit: Payer: Self-pay

## 2022-01-28 DIAGNOSIS — E039 Hypothyroidism, unspecified: Secondary | ICD-10-CM | POA: Diagnosis not present

## 2022-01-28 DIAGNOSIS — R5383 Other fatigue: Secondary | ICD-10-CM | POA: Diagnosis not present

## 2022-01-28 DIAGNOSIS — M2550 Pain in unspecified joint: Secondary | ICD-10-CM | POA: Diagnosis not present

## 2022-01-28 DIAGNOSIS — R454 Irritability and anger: Secondary | ICD-10-CM | POA: Diagnosis not present

## 2022-01-28 DIAGNOSIS — G479 Sleep disorder, unspecified: Secondary | ICD-10-CM | POA: Diagnosis not present

## 2022-01-28 DIAGNOSIS — N951 Menopausal and female climacteric states: Secondary | ICD-10-CM | POA: Diagnosis not present

## 2022-01-28 DIAGNOSIS — Z6841 Body Mass Index (BMI) 40.0 and over, adult: Secondary | ICD-10-CM | POA: Diagnosis not present

## 2022-01-28 MED ORDER — LIOTHYRONINE SODIUM 5 MCG PO TABS
ORAL_TABLET | ORAL | 1 refills | Status: DC
  Filled 2022-01-28: qty 60, 30d supply, fill #0

## 2022-02-19 IMAGING — DX DG HAND COMPLETE 3+V*R*
3 series · 3 of 3 positions shown · non-contrast
Comparison: None.

CLINICAL DATA: Hand pain for 5 days after falling asleep with phone
in hand.

EXAM:
RIGHT HAND - COMPLETE 3+ VIEW

[hand pa]
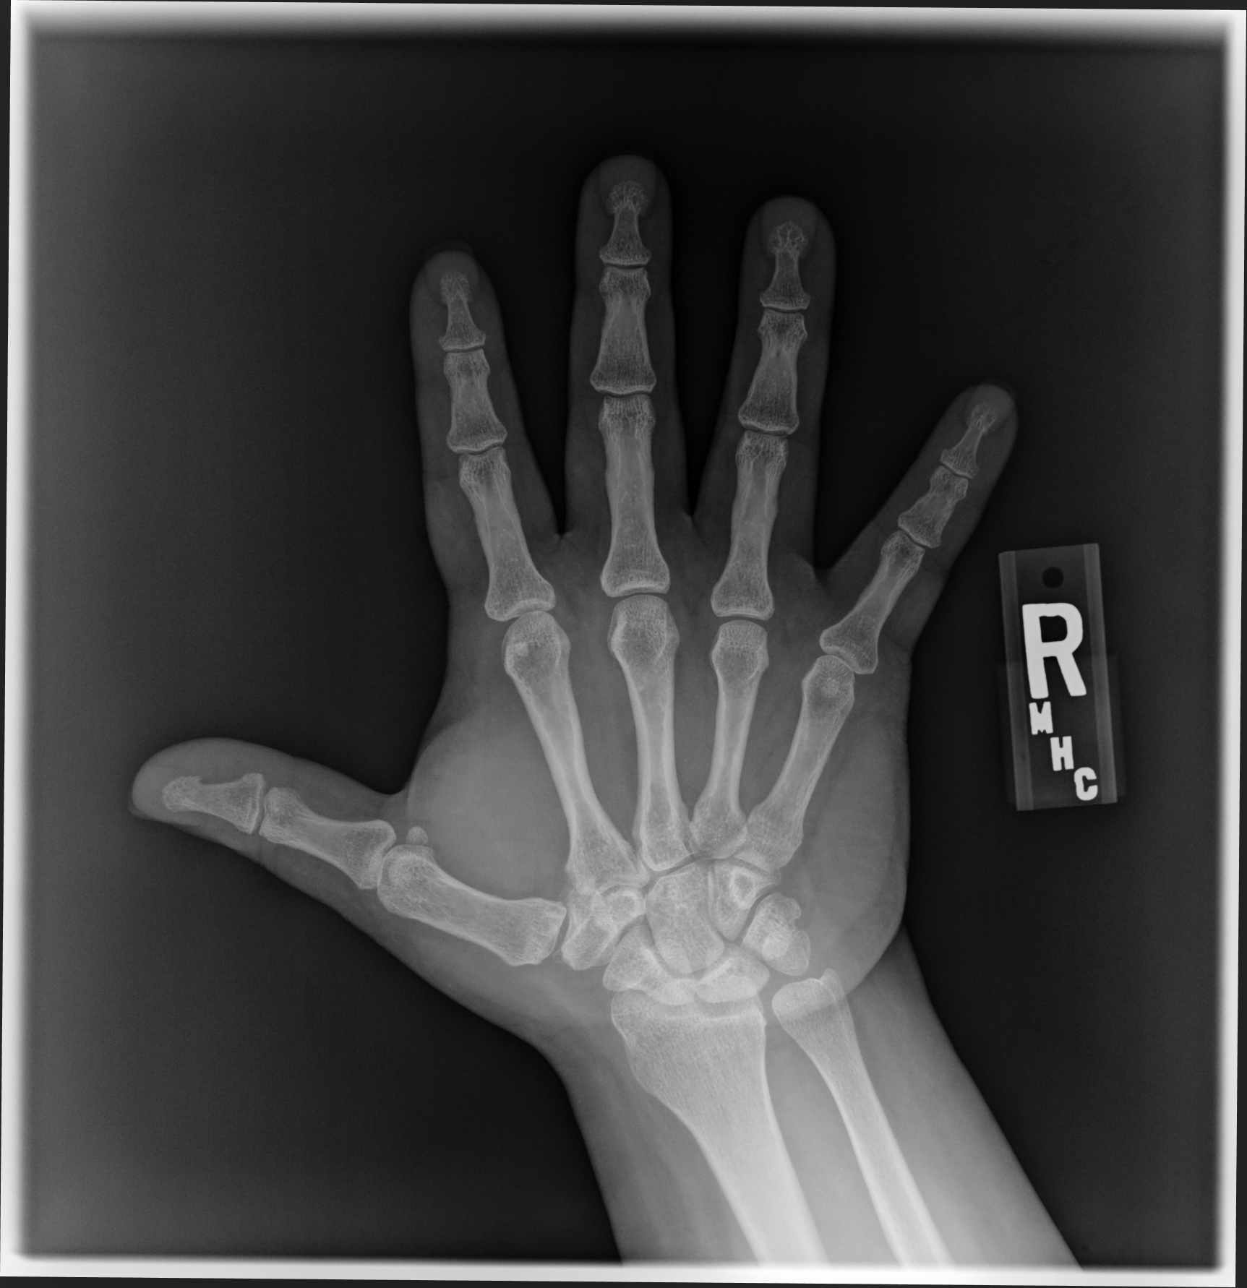

[hand mlo]
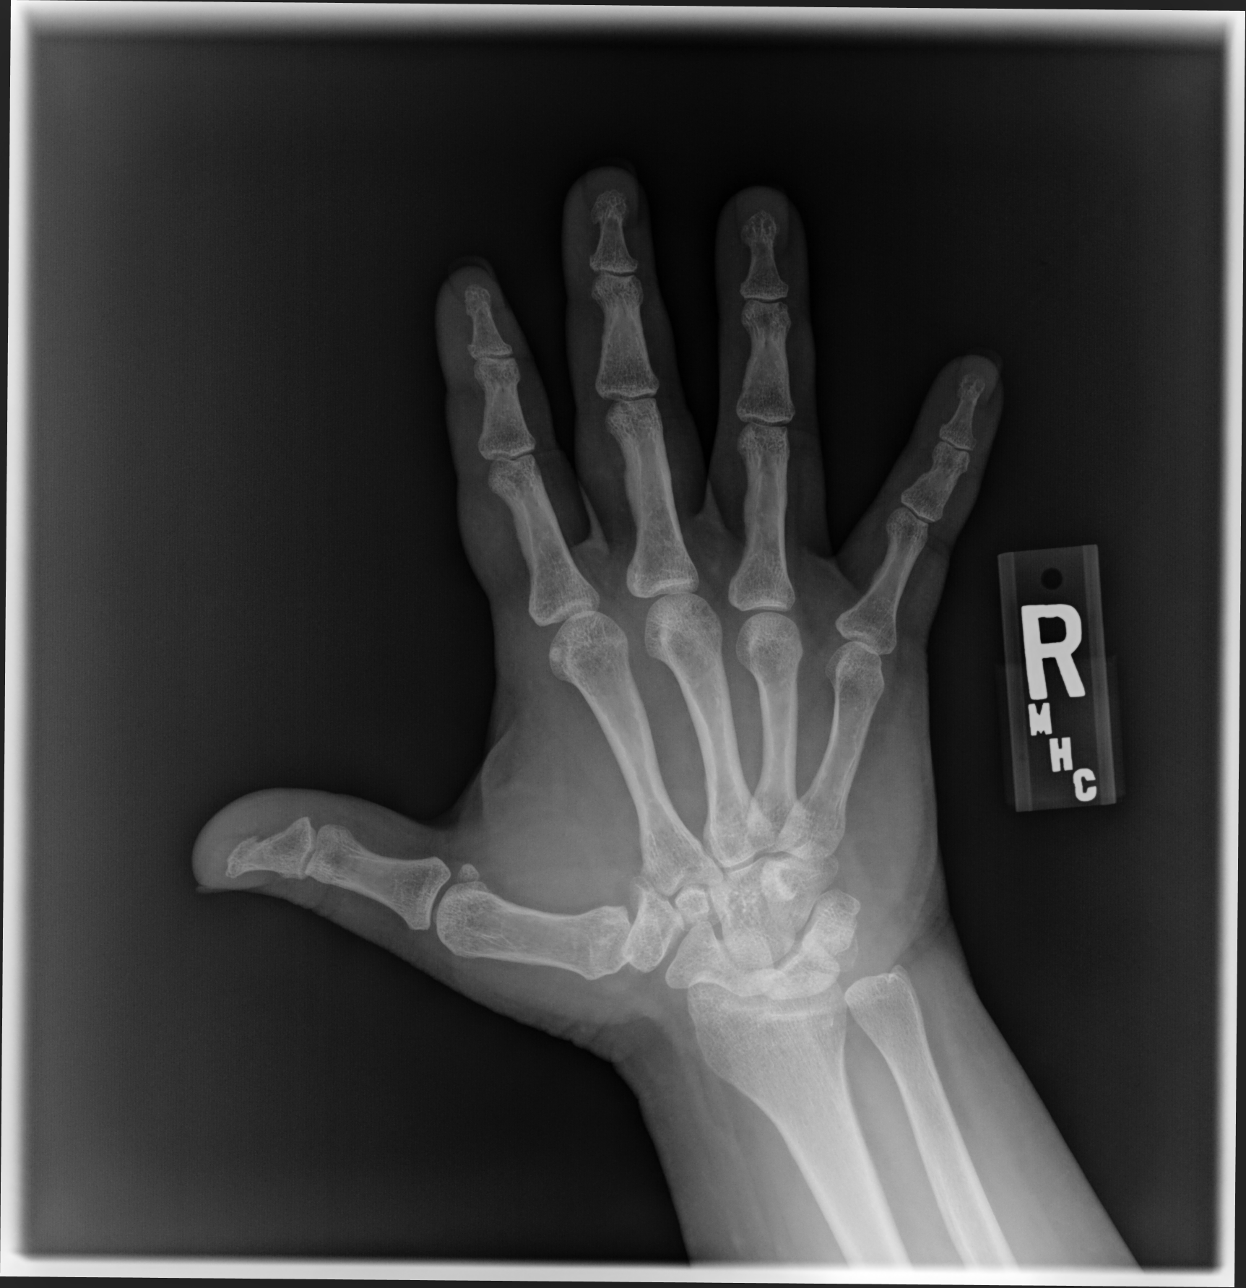

[hand lat]
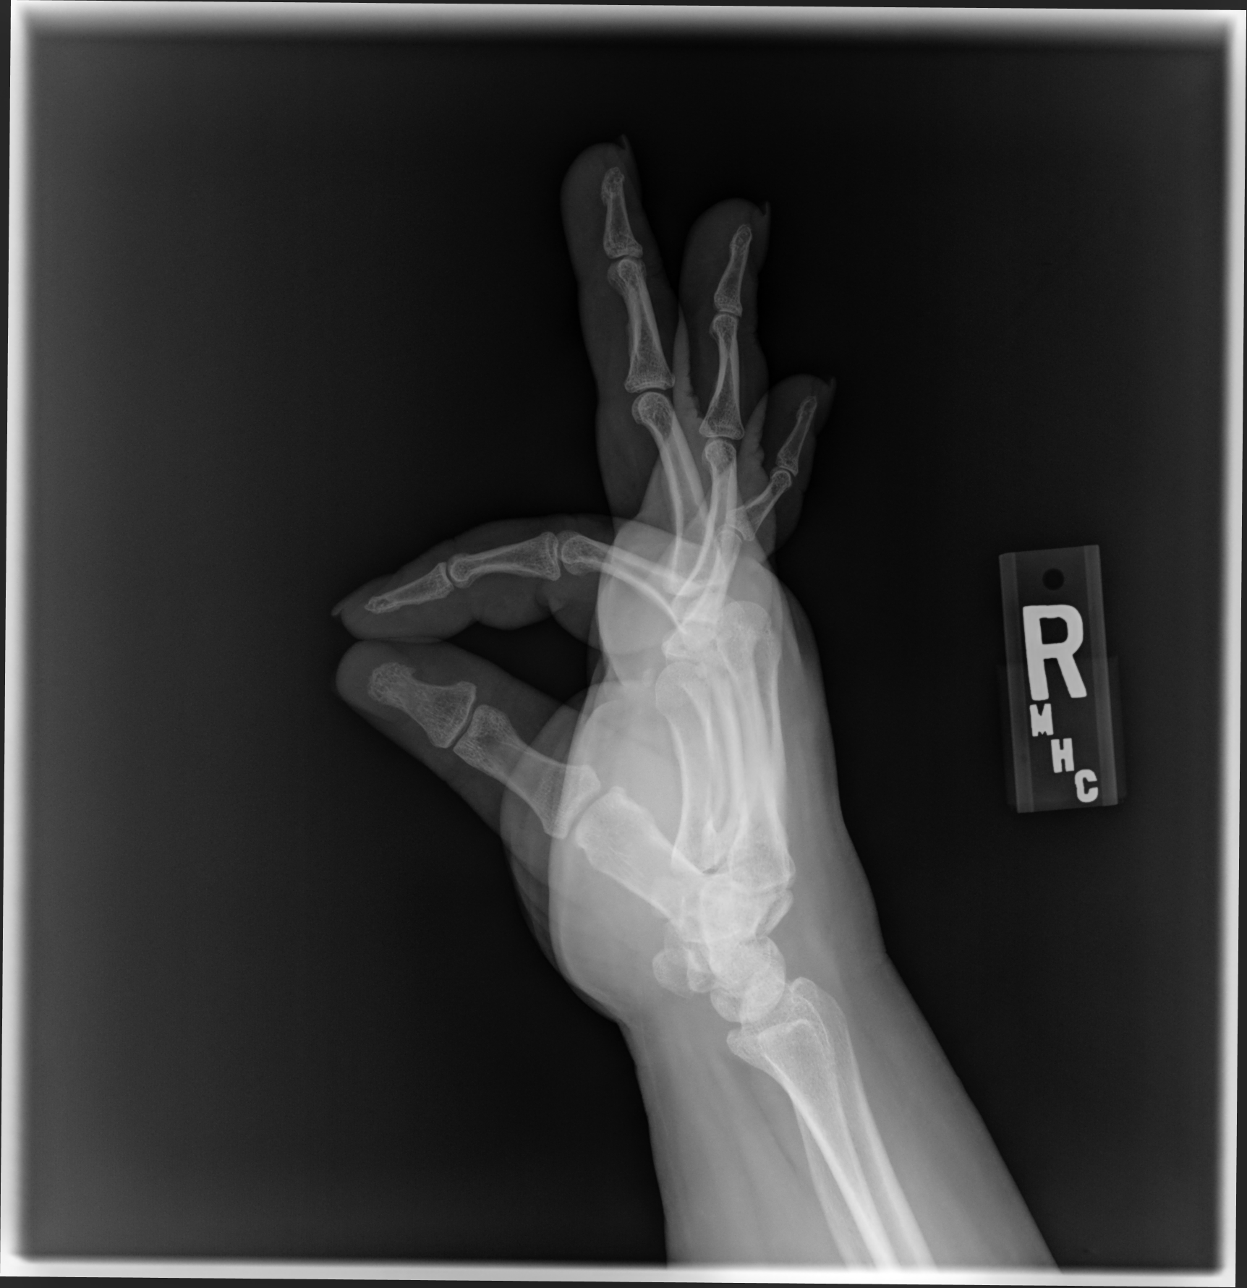

[3 of 3 positions shown; findings below may reference images not displayed]

FINDINGS: There is no evidence of fracture or dislocation. There is no
evidence of arthropathy or other focal bone abnormality. Soft
tissues are unremarkable.
IMPRESSION: Negative.

## 2022-02-24 ENCOUNTER — Encounter: Payer: Self-pay | Admitting: Emergency Medicine

## 2022-02-24 ENCOUNTER — Ambulatory Visit
Admission: EM | Admit: 2022-02-24 | Discharge: 2022-02-24 | Disposition: A | Discharge status: Home / Self Care | Payer: 59 | Attending: Family Medicine | Admitting: Family Medicine

## 2022-02-24 DIAGNOSIS — B349 Viral infection, unspecified: Secondary | ICD-10-CM | POA: Diagnosis not present

## 2022-02-24 DIAGNOSIS — J302 Other seasonal allergic rhinitis: Secondary | ICD-10-CM | POA: Insufficient documentation

## 2022-02-24 DIAGNOSIS — J029 Acute pharyngitis, unspecified: Secondary | ICD-10-CM | POA: Insufficient documentation

## 2022-02-24 LAB — POCT RAPID STREP A (OFFICE): Rapid Strep A Screen: NEGATIVE

## 2022-02-24 NOTE — ED Triage Notes (Signed)
Pt here with bilateral ear pain, generalized bodyaches, sore throat and fever x 3 days. ?

## 2022-02-24 NOTE — ED Provider Notes (Signed)
Renaldo Fiddler    CSN: [id number] Arrival date & time: 02/24/22  1507      History   Chief Complaint Chief Complaint  Patient presents with   Sore Throat   Otalgia   Generalized Body Aches   Fever    HPI Stacey Taylor is a 41 y.o. female.   HPI Patient presents today with a 3-day history of sore throat, generalized body aches and ear pain.  Symptoms initially were mild however worsened today.  She is in for evaluation to rule out active strep infection.  Past Medical History:  Diagnosis Date   Abnormal Pap smear of anus    HGSIL   Anemia    Back pain    Cervical dysplasia    CIN III   CHF (congestive heart failure) (HCC)    Endometriosis    Gallbladder problem    Headache    Heart murmur    Leg edema    Premature delivery    x2    Patient Active Problem List   Diagnosis Date Noted   Generalized anxiety disorder with panic attacks 11/27/2020   Diastolic dysfunction 11/07/2020   OSA (obstructive sleep apnea) 03/13/2020   Abnormal uterine bleeding (AUB)    S/P hysterectomy 03/08/2020   S/P laparoscopic supracervical hysterectomy 03/08/2020   Lymphedema 08/15/2019   Bilateral primary osteoarthritis of knee 06/17/2019   Dizziness 09/16/2018   Headache disorder 09/16/2018   Chronic migraine 07/15/2018   Onychomycosis 03/11/2018   Vitamin B12 deficiency 01/11/2018   Pre-diabetes 01/11/2018   Other fatigue 12/10/2017   Shortness of breath on exertion 12/10/2017   Edema 12/10/2017   Vitamin D deficiency 12/10/2017   Bilateral lower extremity edema 07/09/2017   Dyspnea on exertion 07/09/2017   Essential hypertension 07/09/2017   Anemia 07/09/2017   Chronic low back pain with left-sided sciatica 09/09/2016   Morbid obesity with BMI of 50.0-59.9, adult (HCC) 11/23/2015    Past Surgical History:  Procedure Laterality Date   ABDOMINAL HYSTERECTOMY     CERVICAL BIOPSY  W/ LOOP ELECTRODE EXCISION  11/2005   CESAREAN SECTION  2008/2015   CESAREAN  SECTION WITH BILATERAL TUBAL LIGATION  2015   CHOLECYSTECTOMY  2003   COLPOSCOPY  2005, 2006   COMBINED HYSTEROSCOPY DIAGNOSTIC / D&C  2014   CYSTOSCOPY N/A 03/08/2020   Procedure: CYSTOSCOPY;  Surgeon: Vena Austria, MD;  Location: [facility] ORS;  Service: Gynecology;  Laterality: N/A;   LAPAROSCOPIC SUPRACERVICAL HYSTERECTOMY N/A 03/08/2020   Procedure: LAPAROSCOPIC SUPRACERVICAL HYSTERECTOMY WITH BILATERAL SALPINGECTOMY;  Surgeon: Vena Austria, MD;  Location: [facility] ORS;  Service: Gynecology;  Laterality: N/A;    OB History     Gravida  5   Para  5   Term  3   Preterm  2   AB      Living  5      SAB      IAB      Ectopic      Multiple      Live Births  5            Home Medications    Prior to Admission medications   Medication Sig Start Date End Date Taking? Authorizing Provider  COVID-19 At Home Antigen Test [facility] COVID-19 HOME TEST) KIT Use as directed 12/02/21   Holli Humbles, RPH  cyanocobalamin (,VITAMIN B-12,) 1000 MCG/ML injection Inject 1 mL (1,000 mcg total) into the muscle once a week. Patient not taking: Reported on 11/11/2021 07/01/21  Lorre Munroe, NP  escitalopram (LEXAPRO) 10 MG tablet START WITH 1/2 TABLET (5MG ) BY MOUTH DAILY FOR FIRST 2-4 WEEKS, THEN INCREASE TO 1 TABLET WHEN READY Patient not taking: Reported on 11/11/2021 11/27/20 11/27/21  Smitty Cords, DO  furosemide (LASIX) 40 MG tablet TAKE 1 TABLET BY MOUTH DAILY AS NEEDED FOR FLUID OR EDEMA. MAY TAKE A 2ND DOSE IN 24 HOURS IF NEEDED. Patient not taking: Reported on 11/11/2021 05/10/21   Smitty Cords, DO  liothyronine (CYTOMEL) 5 MCG tablet 1 tablet on an empty stomach, after 7 days increase to 2 tablets Once a day 30 day(s) 01/28/22     meloxicam (MOBIC) 7.5 MG tablet Take 1 table by mouth twice a day with food Patient not taking: Reported on 11/11/2021 05/08/21     ondansetron (ZOFRAN-ODT) 4 MG disintegrating tablet Take 1 tablet (4 mg total) by mouth every 8  (eight) hours as needed for nausea or vomiting. 03/29/21   Lurene Shadow, PA-C  potassium chloride (KLOR-CON) 10 MEQ tablet TAKE 1 TABLET BY MOUTH DAILY. Patient not taking: Reported on 11/11/2021 05/10/21 05/10/22  Smitty Cords, DO  predniSONE (DELTASONE) 20 MG tablet Take 2 tablets daily with breakfast. Patient not taking: Reported on 11/11/2021 08/27/21   Wallis Bamberg, PA-C  WEGOVY 1.7 MG/0.75ML SOAJ Inject 1.7 mg into the skin once a week. 10/24/21   Karamalegos, Netta Neat, DO  loratadine (CLARITIN) 10 MG tablet Take 10 mg by mouth daily.  09/04/20  [provider]    Family History Family History  Adopted: Yes  Family history unknown: Yes    Social History Social History   Tobacco Use   Smoking status: Never   Smokeless tobacco: Never  Vaping Use   Vaping Use: Never used  Substance Use Topics   Alcohol use: No    Alcohol/week: 0.0 standard drinks   Drug use: No     Allergies   Latex and Tramadol   Review of Systems Review of Systems Pertinent negatives listed in HPI   Physical Exam Triage Vital Signs ED Triage Vitals  Enc Vitals Group     BP 02/24/22 1524 132/78     Pulse Rate 02/24/22 1524 (!) 102     Resp 02/24/22 1524 20     Temp 02/24/22 1524 98.4 F (36.9 C)     Temp src --      SpO2 02/24/22 1524 98 %     Weight --      Height --      Head Circumference --      Peak Flow --      Pain Score 02/24/22 1525 6     Pain Loc --      Pain Edu? --      Excl. in GC? --    No data found.  Updated Vital Signs BP 132/78   Pulse (!) 102   Temp 98.4 F (36.9 C)   Resp 20   LMP 01/06/2020   SpO2 98%   Visual Acuity Right Eye Distance:   Left Eye Distance:   Bilateral Distance:    Right Eye Near:   Left Eye Near:    Bilateral Near:     Physical Exam Vitals and nursing note reviewed.  Constitutional:      Appearance: She is obese. She is not ill-appearing.  HENT:     Head: Normocephalic and atraumatic.     Right Ear: Tympanic  membrane and ear canal normal.  Left Ear: A middle ear effusion is present.     Mouth/Throat:     Pharynx: Posterior oropharyngeal erythema present. No pharyngeal swelling, oropharyngeal exudate or uvula swelling.  Cardiovascular:     Rate and Rhythm: Normal rate and regular rhythm.  Pulmonary:     Effort: Pulmonary effort is normal.     Breath sounds: Normal breath sounds.  Musculoskeletal:     Cervical back: Normal range of motion and neck supple.  Lymphadenopathy:     Cervical: No cervical adenopathy.  Skin:    Capillary Refill: Capillary refill takes less than 2 seconds.  Neurological:     General: No focal deficit present.     Mental Status: She is alert.  Psychiatric:        Mood and Affect: Mood normal.     UC Treatments / Results  Labs (all labs ordered are listed, but only abnormal results are displayed) Labs Reviewed  POCT RAPID STREP A (OFFICE) - Normal    EKG   Radiology No results found.  Procedures Procedures (including critical care time)  Medications Ordered in UC Medications - No data to display  Initial Impression / Assessment and Plan / UC Course  I have reviewed the triage vital signs and the nursing notes.  Pertinent labs & imaging results that were available during my care of the patient were reviewed by me and considered in my medical decision making (see chart for details).     Rapid strep is negative.  Suspect symptoms are of a viral/seasonal allergy etiology.  Recommend resuming regular allergy regimen.  Ibuprofen as needed for throat pain and fever.  Hydrate well with fluids.  We will notify you of any abnormal findings result from throat culture.  Follow-up with PCP or return for evaluation. Final Clinical Impressions(s) / UC Diagnoses   Final diagnoses:  Seasonal allergies  Acute pharyngitis, unspecified etiology  Viral illness     Discharge Instructions      Throat culture is pending.  If any bacterial growth is present on  her throat culture will notify you and send medication over to the pharmacy.  In the meantime resume your regular allergy regimen.  Ibuprofen as needed for pain and fever.  Rest and warm beverages to help with throat pain.     ED Prescriptions   None    PDMP not reviewed this encounter.   Bing Neighbors, FNP 02/24/22 1550

## 2022-02-24 NOTE — Discharge Instructions (Signed)
Throat culture is pending.  If any bacterial growth is present on her throat culture will notify you and send medication over to the pharmacy.  In the meantime resume your regular allergy regimen.  Ibuprofen as needed for pain and fever.  Rest and warm beverages to help with throat pain. ?

## 2022-02-25 ENCOUNTER — Telehealth: Payer: 59 | Admitting: Nurse Practitioner

## 2022-02-25 ENCOUNTER — Ambulatory Visit: Payer: Self-pay

## 2022-02-25 DIAGNOSIS — J0100 Acute maxillary sinusitis, unspecified: Secondary | ICD-10-CM | POA: Diagnosis not present

## 2022-02-25 MED ORDER — AMOXICILLIN-POT CLAVULANATE 875-125 MG PO TABS: 1.0000 | ORAL_TABLET | Freq: Two times a day (BID) | ORAL | 0 refills | Status: DC

## 2022-02-25 NOTE — Patient Instructions (Signed)
1. Take meds as prescribed 2. Use a cool mist humidifier especially during the winter months and when heat has been humid. 3. Use saline nose sprays frequently 4. Saline irrigations of the nose can be very helpful if done frequently.  * 4X daily for 1 week*  * Use of a nettie pot can be helpful with this. Follow directions with this* 5. Drink plenty of fluids 6. Keep thermostat turn down low 7.For any cough or congestion- robitussin DM 8. For fever or aces or pains- take tylenol or ibuprofen appropriate for age and weight.  * for fevers greater than 101 orally you may alternate ibuprofen and tylenol every  3 hours.    

## 2022-02-25 NOTE — Telephone Encounter (Signed)
?  Chief Complaint: Fever - 3 days. ?Symptoms: Cough fever, eyes,ears, throat hurt. Also has BA and HA. ?Frequency: Since Sunday ?Pertinent Negatives: Patient denies SOB ?Disposition: '[]'$ ED /Urgent Care (no appt availability in office) / '[x]''[x]'$ Appointment(In office/virtual)/ '[]'$  [facility]/ '[]'$ Home Care/ '[]'$ Refused Recommended Disposition /'[]'$ [facility]/ '[]'$  Follow-up with PCP ?Additional Notes: Made a virtual appt for pt. However, pt understands that virtual care will not be able to listen to her lungs, and may need to go to [facility] or ED.When seen at [facility] pt was tested for strep and COVID - both negative. Pt will follow up as needed ?

## 2022-02-25 NOTE — Progress Notes (Signed)
? ?Virtual Visit Consent  ? ?Stacey Taylor, you are scheduled for a virtual visit with Mary-Margaret Hassell Done, FNP, a [facility] provider, today.   ?  ?Just as with appointments in the office, your consent must be obtained to participate.  Your consent will be active for this visit and any virtual visit you may have with one of our providers in the next 365 days.   ?  ?If you have a MyChart account, a copy of this consent can be sent to you electronically.  All virtual visits are billed to your insurance company just like a traditional visit in the office.   ? ?As this is a virtual visit, video technology does not allow for your provider to perform a traditional examination.  This may limit your provider's ability to fully assess your condition.  If your provider identifies any concerns that need to be evaluated in person or the need to arrange testing (such as labs, EKG, etc.), we will make arrangements to do so.   ?  ?Although advances in technology are sophisticated, we cannot ensure that it will always work on either your end or our end.  If the connection with a video visit is poor, the visit may have to be switched to a telephone visit.  With either a video or telephone visit, we are not always able to ensure that we have a secure connection.    ? ?I need to obtain your verbal consent now.   Are you willing to proceed with your visit today? YES ?  ?Stacey Taylor has provided verbal consent on 02/25/2022 for a virtual visit (video or telephone). ?  ?Mary-Margaret Hassell Done, FNP  ? ?Date: 02/25/2022 5:54 PM ? ? ?Virtual Visit via Video Note  ? ?I, Mary-Margaret Hassell Done, connected with Stacey Taylor ([id number], 1980/11/10) on 02/25/22 at  6:15 PM EDT by a video-enabled telemedicine application and verified that I am speaking with the correct person using two identifiers. ? ?Location: ?Patient: Virtual Visit Location Patient: Home ?Provider: Virtual Visit Location Provider: Mobile ?  ?I discussed the limitations of  evaluation and management by telemedicine and the availability of in person appointments. The patient expressed understanding and agreed to proceed.   ? ?History of Present Illness: ?Stacey Taylor is a 41 y.o. who identifies as a female who was assigned female at birth, and is being seen today for uri. ? ?HPI: Patient in c/o sinus issues. She was seen in urgent care yesterday and was told was just allergies and was told to take claritin OTC. She is worse today with fever higher today then was yesterday. ? ?URI  ?The current episode started in the past 7 days. Associated symptoms include congestion, coughing, rhinorrhea and sinus pain. She has tried antihistamine for the symptoms. The treatment provided mild relief.   ?Review of Systems  ?HENT:  Positive for congestion, rhinorrhea and sinus pain.   ?Respiratory:  Positive for cough.   ? ?Problems:  ?Patient Active Problem List  ? Diagnosis Date Noted  ? Generalized anxiety disorder with panic attacks 11/27/2020  ? Diastolic dysfunction 92/42/6834  ? OSA (obstructive sleep apnea) 03/13/2020  ? Abnormal uterine bleeding (AUB)   ? S/P hysterectomy 03/08/2020  ? S/P laparoscopic supracervical hysterectomy 03/08/2020  ? Lymphedema 08/15/2019  ? Bilateral primary osteoarthritis of knee 06/17/2019  ? Dizziness 09/16/2018  ? Headache disorder 09/16/2018  ? Chronic migraine 07/15/2018  ? Onychomycosis 03/11/2018  ? Vitamin B12 deficiency 01/11/2018  ? Pre-diabetes 01/11/2018  ?  Other fatigue 12/10/2017  ? Shortness of breath on exertion 12/10/2017  ? Edema 12/10/2017  ? Vitamin D deficiency 12/10/2017  ? Bilateral lower extremity edema 07/09/2017  ? Dyspnea on exertion 07/09/2017  ? Essential hypertension 07/09/2017  ? Anemia 07/09/2017  ? Chronic low back pain with left-sided sciatica 09/09/2016  ? Morbid obesity with BMI of 50.0-59.9, adult ([place]) 11/23/2015  ?  ?Allergies:  ?Allergies  ?Allergen Reactions  ? Latex Hives  ? Tramadol   ?  Chest pain  ? ?Medications:   ?Current Outpatient Medications:  ?  COVID-19 At Home Antigen Test [facility] COVID-19 HOME TEST) KIT, Use as directed, Disp: 2 kit, Rfl: 0 ?  cyanocobalamin (,VITAMIN B-12,) 1000 MCG/ML injection, Inject 1 mL (1,000 mcg total) into the muscle once a week. (Patient not taking: Reported on 11/11/2021), Disp: 4 mL, Rfl: 0 ?  escitalopram (LEXAPRO) 10 MG tablet, START WITH 1/2 TABLET (5MG) BY MOUTH DAILY FOR FIRST 2-4 WEEKS, THEN INCREASE TO 1 TABLET WHEN READY (Patient not taking: Reported on 11/11/2021), Disp: 30 tablet, Rfl: 2 ?  furosemide (LASIX) 40 MG tablet, TAKE 1 TABLET BY MOUTH DAILY AS NEEDED FOR FLUID OR EDEMA. MAY TAKE A 2ND DOSE IN 24 HOURS IF NEEDED. (Patient not taking: Reported on 11/11/2021), Disp: 45 tablet, Rfl: 3 ?  liothyronine (CYTOMEL) 5 MCG tablet, 1 tablet on an empty stomach, after 7 days increase to 2 tablets Once a day 30 day(s), Disp: 60 tablet, Rfl: 1 ?  meloxicam (MOBIC) 7.5 MG tablet, Take 1 table by mouth twice a day with food (Patient not taking: Reported on 11/11/2021), Disp: 28 tablet, Rfl: 0 ?  ondansetron (ZOFRAN-ODT) 4 MG disintegrating tablet, Take 1 tablet (4 mg total) by mouth every 8 (eight) hours as needed for nausea or vomiting., Disp: 12 tablet, Rfl: 0 ?  potassium chloride (KLOR-CON) 10 MEQ tablet, TAKE 1 TABLET BY MOUTH DAILY. (Patient not taking: Reported on 11/11/2021), Disp: 30 tablet, Rfl: 3 ?  predniSONE (DELTASONE) 20 MG tablet, Take 2 tablets daily with breakfast. (Patient not taking: Reported on 11/11/2021), Disp: 10 tablet, Rfl: 0 ?  WEGOVY 1.7 MG/0.75ML SOAJ, Inject 1.7 mg into the skin once a week., Disp: 3 mL, Rfl: 2 ? ?Observations/Objective: ?Patient is well-developed, well-nourished in no acute distress.  ?Resting comfortably  at home.  ?Head is normocephalic, atraumatic.  ?No labored breathing.  ?Speech is clear and coherent with logical content.  ?Patient is alert and oriented at baseline.  ?Raspy voice ?Sinus pressure ?No cough during visit ? ?Assessment and  Plan: ? ?Stacey Taylor in today with chief complaint of URI ? ? ?1. Acute non-recurrent maxillary sinusitis ?1. Take meds as prescribed ?2. Use a cool mist humidifier especially during the winter months and when heat has been humid. ?3. Use saline nose sprays frequently ?4. Saline irrigations of the nose can be very helpful if done frequently. ? * 4X daily for 1 week* ? * Use of a nettie pot can be helpful with this. Follow directions with this* ?5. Drink plenty of fluids ?6. Keep thermostat turn down low ?7.For any cough or congestion- robitussin DM ?8. For fever or aces or pains- take tylenol or ibuprofen appropriate for age and weight. ? * for fevers greater than 101 orally you may alternate ibuprofen and tylenol every  3 hours. ?  ?Meds ordered this encounter  ?Medications  ? amoxicillin-clavulanate (AUGMENTIN) 875-125 MG tablet  ?  Sig: Take 1 tablet by mouth 2 (two) times daily.  ?  Dispense:  14 tablet  ?  Refill:  0  ?  Order Specific Question:   Supervising Provider  ?  Answer:   Noemi Chapel [[id number]]  ? ? ? ? ? ?Follow Up Instructions: ?I discussed the assessment and treatment plan with the patient. The patient was provided an opportunity to ask questions and all were answered. The patient agreed with the plan and demonstrated an understanding of the instructions.  A copy of instructions were sent to the patient via MyChart. ? ?The patient was advised to call back or seek an in-person evaluation if the symptoms worsen or if the condition fails to improve as anticipated. ? ?Time:  ?I spent 12 minutes with the patient via telehealth technology discussing the above problems/concerns.   ? ?Mary-Margaret Hassell Done, FNP ? ?

## 2022-02-25 NOTE — Telephone Encounter (Signed)
Summary: Possible sinus infection  ? Possible sinus infection symptoms, patient needs an appt tomorrow she says. She declined the next available (Friday)  ? ?Best contact: [phone]   ?  ? ?Reason for Disposition ? Fever present > 3 days (72 hours) ? ?Answer Assessment - Initial Assessment Questions ?1. LOCATION: "Where does it hurt?"  ?    Throat - sinus, ears, eyes, BA ?2. ONSET: "When did the sinus pain start?"  (e.g., hours, days)  ?    Over the weekend ?3. SEVERITY: "How bad is the pain?"   (Scale 1-10; mild, moderate or severe) ?  - MILD (1-3): doesn't interfere with normal activities  ?  - MODERATE (4-7): interferes with normal activities (e.g., work or school) or awakens from sleep ?  - SEVERE (8-10): excruciating pain and patient unable to do any normal activities    ?    10/10 ?4. RECURRENT SYMPTOM: "Have you ever had sinus problems before?" If Yes, ask: "When was the last time?" and "What happened that time?"  ?    Yes - infection ?5. NASAL CONGESTION: "Is the nose blocked?" If Yes, ask: "Can you open it or must you breathe through your mouth?" ?    Very little ?6. NASAL DISCHARGE: "Do you have discharge from your nose?" If so ask, "What color?" ?    Neon green ?7. FEVER: "Do you have a fever?" If Yes, ask: "What is it, how was it measured, and when did it start?"  ?    102.5 ?8. OTHER SYMPTOMS: "Do you have any other symptoms?" (e.g., sore throat, cough, earache, difficulty breathing) ?    BA, HA ?9. PREGNANCY: "Is there any chance you are pregnant?" "When was your last menstrual period?" ?    na ? ?Protocols used: Sinus Pain or Congestion-A-AH ? ?

## 2022-02-27 LAB — CULTURE, GROUP A STREP (THRC)

## 2022-03-03 DIAGNOSIS — M25561 Pain in right knee: Secondary | ICD-10-CM | POA: Diagnosis not present

## 2022-03-03 DIAGNOSIS — M25461 Effusion, right knee: Secondary | ICD-10-CM | POA: Diagnosis not present

## 2022-03-03 DIAGNOSIS — M545 Low back pain, unspecified: Secondary | ICD-10-CM | POA: Diagnosis not present

## 2022-03-03 DIAGNOSIS — S83281D Other tear of lateral meniscus, current injury, right knee, subsequent encounter: Secondary | ICD-10-CM | POA: Diagnosis not present

## 2022-03-12 ENCOUNTER — Ambulatory Visit: Payer: Self-pay

## 2022-03-12 ENCOUNTER — Other Ambulatory Visit: Payer: Self-pay

## 2022-03-12 ENCOUNTER — Ambulatory Visit
Admission: EM | Admit: 2022-03-12 | Discharge: 2022-03-12 | Disposition: A | Discharge status: Home / Self Care | Payer: 59 | Attending: Emergency Medicine | Admitting: Emergency Medicine

## 2022-03-12 DIAGNOSIS — H1011 Acute atopic conjunctivitis, right eye: Secondary | ICD-10-CM | POA: Diagnosis not present

## 2022-03-12 DIAGNOSIS — R059 Cough, unspecified: Secondary | ICD-10-CM | POA: Diagnosis not present

## 2022-03-12 DIAGNOSIS — J302 Other seasonal allergic rhinitis: Secondary | ICD-10-CM | POA: Diagnosis not present

## 2022-03-12 DIAGNOSIS — J029 Acute pharyngitis, unspecified: Secondary | ICD-10-CM | POA: Diagnosis not present

## 2022-03-12 LAB — POCT RAPID STREP A (OFFICE): Rapid Strep A Screen: NEGATIVE

## 2022-03-12 MED ORDER — BENZONATATE 100 MG PO CAPS
100.0000 mg | ORAL_CAPSULE | Freq: Three times a day (TID) | ORAL | 0 refills | Status: DC | PRN
  Filled 2022-03-12: qty 21, 7d supply, fill #0

## 2022-03-12 NOTE — Telephone Encounter (Signed)
This encounter was created in error - please disregard.

## 2022-03-12 NOTE — Discharge Instructions (Addendum)
Your strep test is negative.   ?Take the [facility] as needed for cough.  ?Continue taking a daily allergy medication such as Zyrtec or Xyzal.  Start using Flonase nasal spray.  Take Tylenol or ibuprofen as needed for  discomfort.  ?Follow-up with your primary care provider.   ? ? ?

## 2022-03-12 NOTE — ED Triage Notes (Signed)
Patient presents to Urgent Care for follow-up appt from 04/24 visit. She states she continues to have cough, eye irritation, and sore throat. PCP could not get her in instructed her to come to UC. Treating symptoms with OTC allergy med and ibuprofen.  ? ?Denies fever.  ? ? ?

## 2022-03-12 NOTE — Telephone Encounter (Signed)
?  Chief Complaint: Sore throat  ?Symptoms: Sore throat right side, HA, ear pain ?Frequency: before 02/24/2022 ?Pertinent Negatives: Patient denies SOB ?Disposition: '[]'$ ED /'[x]'$ Urgent Care (no appt availability in office) / '[]'$ Appointment(In office/virtual)/ '[]'$  [facility]/ '[]'$ Home Care/ '[]'$ Refused Recommended Disposition /'[]'$ [facility]/ '[]'$  Follow-up with PCP ?Additional Notes: Pt was seen at [facility] on 4/24 and had a virtual appt after that. Pt is still having several ongoing complaints. Pt has right side throat pain with swallowing, Ear pain cough, possible fever and right eye redness and discharge. ? ?Reason for Disposition ? SEVERE (e.g., excruciating) throat pain ? ?Answer Assessment - Initial Assessment Questions ?1. ONSET: "When did the throat start hurting?" (Hours or days ago)  ?   Before 02/24/2022 ?2. SEVERITY: "How bad is the sore throat?" (Scale 1-10; mild, moderate or severe) ?  - MILD (1-3):  doesn't interfere with eating or normal activities ?  - MODERATE (4-7): interferes with eating some solids and normal activities ?  - SEVERE (8-10):  excruciating pain, interferes with most normal activities ?  - SEVERE DYSPHAGIA: can't swallow liquids, drooling ?    5-6/10 - when swallowing ?3. STREP EXPOSURE: "Has there been any exposure to strep within the past week?" If Yes, ask: "What type of contact occurred?"  ?    no ?4.  VIRAL SYMPTOMS: "Are there any symptoms of a cold, such as a runny nose, cough, hoarse voice or red eyes?"  ?    Red eyes, mucus in eye, ear pain ?5. FEVER: "Do you have a fever?" If Yes, ask: "What is your temperature, how was it measured, and when did it start?" ?    maybe ?6. PUS ON THE TONSILS: "Is there pus on the tonsils in the back of your throat?" ?    No - right side very swollen ?7. OTHER SYMPTOMS: "Do you have any other symptoms?" (e.g., difficulty breathing, headache, rash) ?    HA ?8. PREGNANCY: "Is there any chance you are pregnant?" "When was your last menstrual  period?" ?    na ? ?Protocols used: Sore Throat-A-AH ? ?

## 2022-03-12 NOTE — ED Provider Notes (Signed)
?[facility] ? ? ? ?CSN: [id number] ?Arrival date & time: 03/12/22  1348 ? ? ?  ? ?History   ?Chief Complaint ?Chief Complaint  ?Patient presents with  ? Sore Throat  ? Cough  ? Eye Problem  ?  Eye irritation   ? ? ?HPI ?Stacey Taylor is a 41 y.o. female.  Presents with 3-week history of sore throat and cough.  She also has right eye redness and tearing since this morning.  No eye injury, purulent drainage, eye pain, changes in vision.  No fever, rash, shortness of breath, vomiting, diarrhea, or other symptoms.  Treatment at home with ibuprofen today and allergy medication last night.  Patient had a video visit on 02/25/2022; diagnosed with acute sinusitis; treated with Augmentin.  Patient was seen at this urgent care on 02/24/2022; diagnosed with seasonal allergies, acute pharyngitis, viral illness; treated symptomatically; rapid strep and throat culture negative.  Her medical history includes hypertension, congestive heart failure, prediabetes, chronic migraine headaches, chronic low back pain, morbid obesity. ? ?The history is provided by the patient and medical records.  ? ?Past Medical History:  ?Diagnosis Date  ? Abnormal Pap smear of anus   ? HGSIL  ? Anemia   ? Back pain   ? Cervical dysplasia   ? CIN III  ? CHF (congestive heart failure) ([place])   ? Endometriosis   ? Gallbladder problem   ? Headache   ? Heart murmur   ? Leg edema   ? Premature delivery   ? x2  ? ? ?Patient Active Problem List  ? Diagnosis Date Noted  ? Generalized anxiety disorder with panic attacks 11/27/2020  ? Diastolic dysfunction 62/95/2841  ? OSA (obstructive sleep apnea) 03/13/2020  ? Abnormal uterine bleeding (AUB)   ? S/P hysterectomy 03/08/2020  ? S/P laparoscopic supracervical hysterectomy 03/08/2020  ? Lymphedema 08/15/2019  ? Bilateral primary osteoarthritis of knee 06/17/2019  ? Dizziness 09/16/2018  ? Headache disorder 09/16/2018  ? Chronic migraine 07/15/2018  ? Onychomycosis 03/11/2018  ? Vitamin B12 deficiency  01/11/2018  ? Pre-diabetes 01/11/2018  ? Other fatigue 12/10/2017  ? Shortness of breath on exertion 12/10/2017  ? Edema 12/10/2017  ? Vitamin D deficiency 12/10/2017  ? Bilateral lower extremity edema 07/09/2017  ? Dyspnea on exertion 07/09/2017  ? Essential hypertension 07/09/2017  ? Anemia 07/09/2017  ? Chronic low back pain with left-sided sciatica 09/09/2016  ? Morbid obesity with BMI of 50.0-59.9, adult ([place]) 11/23/2015  ? ? ?Past Surgical History:  ?Procedure Laterality Date  ? ABDOMINAL HYSTERECTOMY    ? CERVICAL BIOPSY  W/ LOOP ELECTRODE EXCISION  11/2005  ? CESAREAN SECTION  2008/2015  ? CESAREAN SECTION WITH BILATERAL TUBAL LIGATION  2015  ? CHOLECYSTECTOMY  2003  ? COLPOSCOPY  2005, 2006  ? COMBINED HYSTEROSCOPY DIAGNOSTIC / D&C  2014  ? CYSTOSCOPY N/A 03/08/2020  ? Procedure: CYSTOSCOPY;  Surgeon: Malachy Mood, MD;  Location: [facility] ORS;  Service: Gynecology;  Laterality: N/A;  ? LAPAROSCOPIC SUPRACERVICAL HYSTERECTOMY N/A 03/08/2020  ? Procedure: LAPAROSCOPIC SUPRACERVICAL HYSTERECTOMY WITH BILATERAL SALPINGECTOMY;  Surgeon: Malachy Mood, MD;  Location: [facility] OR[facility];  Service: Gynecology;  Laterality: N/A;  ? ? ?OB History   ? ? Gravida  ?5  ? Para  ?5  ? Term  ?3  ? Preterm  ?2  ? AB  ?   ? Living  ?5  ?  ? ? SAB  ?   ? IAB  ?   ? Ectopic  ?   ?  Multiple  ?   ? Live Births  ?5  ?   ?  ?  ? ? ? ?Home Medications   ? ?Prior to Admission medications   ?Medication Sig Start Date End Date Taking? Authorizing Provider  ?benzonatate (TESSALON) 100 MG capsule Take 1 capsule (100 mg total) by mouth 3 (three) times daily as needed for cough. 03/12/22  Yes Sharion Balloon, NP  ?amoxicillin-clavulanate (AUGMENTIN) 875-125 MG tablet Take 1 tablet by mouth 2 (two) times daily. 02/25/22   Chevis Pretty, FNP  ?COVID-19 At Home Antigen Test [facility] COVID-19 HOME TEST) KIT Use as directed 12/02/21   Letta Median, RPH  ?cyanocobalamin (,VITAMIN B-12,) 1000 MCG/ML injection Inject 1 mL (1,000 mcg total)  into the muscle once a week. ?Patient not taking: Reported on 11/11/2021 07/01/21   Jearld Fenton, NP  ?escitalopram (LEXAPRO) 10 MG tablet START WITH 1/2 TABLET (5MG) BY MOUTH DAILY FOR FIRST 2-4 WEEKS, THEN INCREASE TO 1 TABLET WHEN READY ?Patient not taking: Reported on 11/11/2021 11/27/20 11/27/21  Olin Hauser, DO  ?furosemide (LASIX) 40 MG tablet TAKE 1 TABLET BY MOUTH DAILY AS NEEDED FOR FLUID OR EDEMA. MAY TAKE A 2ND DOSE IN 24 HOURS IF NEEDED. ?Patient not taking: Reported on 11/11/2021 05/10/21   Olin Hauser, DO  ?liothyronine (CYTOMEL) 5 MCG tablet 1 tablet on an empty stomach, after 7 days increase to 2 tablets Once a day 30 day(s) 01/28/22     ?meloxicam (MOBIC) 7.5 MG tablet Take 1 table by mouth twice a day with food ?Patient not taking: Reported on 11/11/2021 05/08/21     ?ondansetron (ZOFRAN-ODT) 4 MG disintegrating tablet Take 1 tablet (4 mg total) by mouth every 8 (eight) hours as needed for nausea or vomiting. 03/29/21   Noe Gens, PA-C  ?potassium chloride (KLOR-CON) 10 MEQ tablet TAKE 1 TABLET BY MOUTH DAILY. ?Patient not taking: Reported on 11/11/2021 05/10/21 05/10/22  Olin Hauser, DO  ?predniSONE (DELTASONE) 20 MG tablet Take 2 tablets daily with breakfast. ?Patient not taking: Reported on 11/11/2021 08/27/21   Jaynee Eagles, PA-C  ?WEGOVY 1.7 MG/0.75ML SOAJ Inject 1.7 mg into the skin once a week. 10/24/21   Karamalegos, Devonne Doughty, DO  ?loratadine (CLARITIN) 10 MG tablet Take 10 mg by mouth daily.  09/04/20  [provider]  ? ? ?Family History ?Family History  ?Adopted: Yes  ?Family history unknown: Yes  ? ? ?Social History ?Social History  ? ?Tobacco Use  ? Smoking status: Never  ? Smokeless tobacco: Never  ?Vaping Use  ? Vaping Use: Never used  ?Substance Use Topics  ? Alcohol use: No  ?  Alcohol/week: 0.0 standard drinks  ? Drug use: No  ? ? ? ?Allergies   ?Latex and Tramadol ? ? ?Review of Systems ?Review of Systems  ?Constitutional:  Negative for chills and  fever.  ?HENT:  Positive for sore throat. Negative for ear pain.   ?Eyes:  Positive for discharge, redness and itching. Negative for pain and visual disturbance.  ?Respiratory:  Positive for cough. Negative for shortness of breath.   ?Cardiovascular:  Negative for chest pain and palpitations.  ?Gastrointestinal:  Negative for diarrhea and vomiting.  ?Skin:  Negative for color change and rash.  ?All other systems reviewed and are negative. ? ? ?Physical Exam ?Triage Vital Signs ?ED Triage Vitals  ?Enc Vitals Group  ?   BP   ?   Pulse   ?   Resp   ?  Temp   ?   Temp src   ?   SpO2   ?   Weight   ?   Height   ?   Head Circumference   ?   Peak Flow   ?   Pain Score   ?   Pain Loc   ?   Pain Edu?   ?   Excl. in [place]?   ? ?No data found. ? ?Updated Vital Signs ?BP 136/74   Pulse (!) 110   Temp 98.4 ?F (36.9 ?C)   Resp 18   LMP 01/06/2020   SpO2 98%  ? ?Visual Acuity ?Right Eye Distance:   ?Left Eye Distance:   ?Bilateral Distance:   ? ?Right Eye Near:   ?Left Eye Near:    ?Bilateral Near:    ? ?Physical Exam ?Vitals and nursing note reviewed.  ?Constitutional:   ?   General: She is not in acute distress. ?   Appearance: She is well-developed. She is obese. She is not ill-appearing.  ?HENT:  ?   Right Ear: Tympanic membrane normal.  ?   Left Ear: Tympanic membrane normal.  ?   Nose: Nose normal.  ?   Mouth/Throat:  ?   Mouth: Mucous membranes are moist.  ?   Pharynx: Posterior oropharyngeal erythema present.  ?Eyes:  ?   Extraocular Movements: Extraocular movements intact.  ?   Conjunctiva/sclera:  ?   Right eye: Right conjunctiva is injected.  ?   Pupils: Pupils are equal, round, and reactive to light.  ?Cardiovascular:  ?   Rate and Rhythm: Normal rate and regular rhythm.  ?   Heart sounds: Normal heart sounds.  ?Pulmonary:  ?   Effort: Pulmonary effort is normal. No respiratory distress.  ?   Breath sounds: Normal breath sounds.  ?Musculoskeletal:  ?   Cervical back: Neck supple.  ?Skin: ?   General: Skin is warm  and dry.  ?   Capillary Refill: Capillary refill takes less than 2 seconds.  ?Neurological:  ?   Mental Status: She is alert.  ?Psychiatric:     ?   Mood and Affect: Mood normal.     ?   Behavior: Behavior normal.  ?

## 2022-03-14 ENCOUNTER — Ambulatory Visit: Payer: 59 | Admitting: Family Medicine

## 2022-03-14 ENCOUNTER — Encounter: Payer: Self-pay | Admitting: Family Medicine

## 2022-03-14 VITALS — BP 122/84 | HR 85 | Ht 60.0 in | Wt 271.4 lb

## 2022-03-14 DIAGNOSIS — J0111 Acute recurrent frontal sinusitis: Secondary | ICD-10-CM

## 2022-03-14 MED ORDER — AMOXICILLIN-POT CLAVULANATE 875-125 MG PO TABS: 1.0000 | ORAL_TABLET | Freq: Two times a day (BID) | ORAL | 0 refills | Status: DC

## 2022-03-14 MED ORDER — PREDNISONE 20 MG PO TABS: ORAL_TABLET | ORAL | 0 refills | Status: DC

## 2022-03-14 NOTE — Patient Instructions (Addendum)
Thank you for coming to the office today. ? ?10 day augmentin ? ?Prednisone taper as discussed ? ? ?Please schedule a Follow-up Appointment to: Return if symptoms worsen or fail to improve. ? ?If you have any other questions or concerns, please feel free to call the office or send a message through [facility]. You may also schedule an earlier appointment if necessary. ? ?Additionally, you may be receiving a survey about your experience at our office within a few days to 1 week by e-mail or mail. We value your feedback. ? ?Nobie Putnam, DO ?[place] ?

## 2022-03-14 NOTE — Progress Notes (Signed)
? ?Subjective:  ? ? Patient ID: Stacey Taylor, female    DOB: June 26, 1981, 41 y.o.   MRN: [id number] ? ?Stacey Taylor is a 41 y.o. female presenting on 03/14/2022 for Sinusitis ? ? ?HPI ? ?Sinusitis ?Recent history 4/24 urgent care treated with allergy medication, then seen virtual visit 4/25, treated with Augmentin 7 day course. She had triage on 5/;10 and sent back to urgent care, they advised to switch allergy meds, some improved but still has now worsening sinus pressure ?Tried Flonase ineffective. Cannot tolerate other OTC options ?Denies fever, dyspnea ? ? ? ?  05/29/2021  ?  1:42 PM 03/28/2020  ?  9:23 AM 03/13/2020  ?  1:24 PM  ?Depression screen PHQ 2/9  ?Decreased Interest 0 0 0  ?Down, Depressed, Hopeless 0 0 0  ?PHQ - 2 Score 0 0 0  ?Altered sleeping 0    ?Tired, decreased energy 1    ?Change in appetite 1    ?Feeling bad or failure about yourself  0    ?Trouble concentrating 0    ?Moving slowly or fidgety/restless 0    ?Suicidal thoughts 0    ?PHQ-9 Score 2    ?Difficult doing work/chores Not difficult at all    ? ? ?Social History  ? ?Tobacco Use  ? Smoking status: Never  ? Smokeless tobacco: Never  ?Vaping Use  ? Vaping Use: Never used  ?Substance Use Topics  ? Alcohol use: No  ?  Alcohol/week: 0.0 standard drinks  ? Drug use: No  ? ? ?Review of Systems ?Per HPI unless specifically indicated above ? ?   ?Objective:  ?  ?BP 122/84   Pulse 85   Ht 5' (1.524 m)   Wt 271 lb 6.4 oz (123.1 kg)   LMP 01/06/2020   SpO2 99%   BMI 53.00 kg/m?   ?Wt Readings from Last 3 Encounters:  ?03/14/22 271 lb 6.4 oz (123.1 kg)  ?11/11/21 272 lb (123.4 kg)  ?09/03/21 273 lb 3.2 oz (123.9 kg)  ?  ?Physical Exam ?Vitals and nursing note reviewed.  ?Constitutional:   ?   General: She is not in acute distress. ?   Appearance: She is well-developed. She is not diaphoretic.  ?   Comments: Well-appearing, comfortable, cooperative  ?HENT:  ?   Head: Normocephalic and atraumatic.  ?   Ears:  ?   Comments: R TM with effusion ?    Mouth/Throat:  ?   Pharynx: Posterior oropharyngeal erythema present.  ?Eyes:  ?   General:     ?   Right eye: No discharge.     ?   Left eye: No discharge.  ?   Conjunctiva/sclera: Conjunctivae normal.  ?Neck:  ?   Thyroid: No thyromegaly.  ?Cardiovascular:  ?   Rate and Rhythm: Normal rate and regular rhythm.  ?   Heart sounds: Normal heart sounds. No murmur heard. ?Pulmonary:  ?   Effort: Pulmonary effort is normal. No respiratory distress.  ?   Breath sounds: Normal breath sounds. No wheezing or rales.  ?Musculoskeletal:     ?   General: Normal range of motion.  ?   Cervical back: Normal range of motion and neck supple.  ?Lymphadenopathy:  ?   Cervical: No cervical adenopathy.  ?Skin: ?   General: Skin is warm and dry.  ?   Findings: No erythema or rash.  ?Neurological:  ?   Mental Status: She is alert and oriented to person, place, and time.  ?  Psychiatric:     ?   Behavior: Behavior normal.  ?   Comments: Well groomed, good eye contact, normal speech and thoughts  ? ?Results for orders placed or performed during the hospital encounter of 03/12/22  ?POCT rapid strep A  ?Result Value Ref Range  ? Rapid Strep A Screen Negative Negative  ? ?   ?Assessment & Plan:  ? ?Problem List Items Addressed This Visit   ?None ?Visit Diagnoses   ? ? Acute non-recurrent frontal sinusitis    -  Primary  ? Relevant Medications  ? predniSONE (DELTASONE) 20 MG tablet  ? amoxicillin-clavulanate (AUGMENTIN) 875-125 MG tablet  ? ?  ?  ?Consistent with acute recurrent frontal sinusitis, likely initially viral URI vs allergic rhinitis component with worsening concern for bacterial infection - seems ineffective or partial resolution with prior antibiotic course ? ?Plan: ?1. Repeat course augmentin x 10 days ?2. Add prednisone taper for sinus pressure pain / eustachian tube dysfunction effusion ?Limited other options based on previous trials or patient preference/ineffective therapy ?Return criteria reviewed ? ? ?Meds ordered this  encounter  ?Medications  ? predniSONE (DELTASONE) 20 MG tablet  ?  Sig: Take 2 pills ('40mg'$ ) daily with meal for 2 days, then take 1 pill daily for 2 days to finish course.  ?  Dispense:  6 tablet  ?  Refill:  0  ? amoxicillin-clavulanate (AUGMENTIN) 875-125 MG tablet  ?  Sig: Take 1 tablet by mouth 2 (two) times daily.  ?  Dispense:  20 tablet  ?  Refill:  0  ? ? ? ? ?Follow up plan: ?Return if symptoms worsen or fail to improve. ? ? ?Nobie Putnam, DO ?[facility] ?03/14/2022, 9:01 AM ?

## 2022-04-27 ENCOUNTER — Encounter: Payer: Self-pay | Admitting: Emergency Medicine

## 2022-04-27 ENCOUNTER — Ambulatory Visit
Admission: EM | Admit: 2022-04-27 | Discharge: 2022-04-27 | Disposition: A | Discharge status: Home / Self Care | Payer: 59 | Attending: Urgent Care | Admitting: Urgent Care

## 2022-04-27 DIAGNOSIS — L03115 Cellulitis of right lower limb: Secondary | ICD-10-CM

## 2022-04-27 DIAGNOSIS — T63421A Toxic effect of venom of ants, accidental (unintentional), initial encounter: Secondary | ICD-10-CM

## 2022-04-27 MED ORDER — CEPHALEXIN 500 MG PO CAPS: 500.0000 mg | ORAL_CAPSULE | Freq: Four times a day (QID) | ORAL | 0 refills | Status: AC

## 2022-04-27 MED ORDER — MUPIROCIN 2 % EX OINT: 1.0000 | TOPICAL_OINTMENT | Freq: Two times a day (BID) | CUTANEOUS | 0 refills | Status: DC

## 2022-04-27 MED ORDER — METHYLPREDNISOLONE SODIUM SUCC 125 MG IJ SOLR: 80.0000 mg | Freq: Once | INTRAMUSCULAR | Status: DC

## 2022-04-27 MED ORDER — DEXAMETHASONE SODIUM PHOSPHATE 10 MG/ML IJ SOLN
10.0000 mg | Freq: Once | INTRAMUSCULAR | Status: AC
  Administered 2022-04-27: 10 mg via INTRAMUSCULAR

## 2022-04-27 NOTE — ED Triage Notes (Signed)
Pt states she step on an ant hill yesterday and has right ankle swelling and pain.

## 2022-07-23 DIAGNOSIS — G4733 Obstructive sleep apnea (adult) (pediatric): Secondary | ICD-10-CM | POA: Diagnosis not present

## 2022-08-22 ENCOUNTER — Other Ambulatory Visit: Payer: Self-pay

## 2022-08-22 ENCOUNTER — Ambulatory Visit: Payer: 59 | Admitting: Family Medicine

## 2022-08-22 ENCOUNTER — Other Ambulatory Visit: Payer: Self-pay | Admitting: Family Medicine

## 2022-08-22 ENCOUNTER — Encounter: Payer: Self-pay | Admitting: Family Medicine

## 2022-08-22 VITALS — BP 158/80 | HR 78 | Resp 16 | Ht 60.0 in | Wt 270.0 lb

## 2022-08-22 DIAGNOSIS — Z111 Encounter for screening for respiratory tuberculosis: Secondary | ICD-10-CM

## 2022-08-22 DIAGNOSIS — I10 Essential (primary) hypertension: Secondary | ICD-10-CM

## 2022-08-22 DIAGNOSIS — E559 Vitamin D deficiency, unspecified: Secondary | ICD-10-CM

## 2022-08-22 DIAGNOSIS — R7303 Prediabetes: Secondary | ICD-10-CM

## 2022-08-22 DIAGNOSIS — M62830 Muscle spasm of back: Secondary | ICD-10-CM

## 2022-08-22 DIAGNOSIS — D649 Anemia, unspecified: Secondary | ICD-10-CM

## 2022-08-22 DIAGNOSIS — E538 Deficiency of other specified B group vitamins: Secondary | ICD-10-CM

## 2022-08-22 DIAGNOSIS — E7889 Other lipoprotein metabolism disorders: Secondary | ICD-10-CM

## 2022-08-22 DIAGNOSIS — G4733 Obstructive sleep apnea (adult) (pediatric): Secondary | ICD-10-CM | POA: Diagnosis not present

## 2022-08-22 MED ORDER — CYCLOBENZAPRINE HCL 5 MG PO TABS
5.0000 mg | ORAL_TABLET | Freq: Three times a day (TID) | ORAL | 2 refills | Status: DC | PRN
  Filled 2022-08-22: qty 60, 20d supply, fill #0

## 2022-08-22 NOTE — Patient Instructions (Addendum)
Thank you for coming to the office today.  Re ordered Cyclobenazprine '5mg'$  can double if need, take as needed up to max 3 times per day. Caution sedation. Refills added.  Keep on the heating pad and stretching.   DUE for FASTING BLOOD WORK (no food or drink after midnight before the lab appointment, only water or coffee without cream/sugar on the morning of)  SCHEDULE "Lab Only" visit in the morning at the clinic for lab draw within 1 WEEK  - Make sure Lab Only appointment is at about 1 week before your next appointment, so that results will be available  For Lab Results, once available within 2-3 days of blood draw, you can can log in to MyChart online to view your results and a brief explanation. Also, we can discuss results at next follow-up visit.    Please schedule a Follow-up Appointment to: Return in about 1 week (around 08/29/2022) for within 1 week will need AM fasting lab only then 1-2 days later Follow-up Back, Lab results.  If you have any other questions or concerns, please feel free to call the office or send a message through [place]. You may also schedule an earlier appointment if necessary.  Additionally, you may be receiving a survey about your experience at our office within a few days to 1 week by e-mail or mail. We value your feedback.  Nobie Putnam, DO [place]

## 2022-08-22 NOTE — Progress Notes (Signed)
Subjective:    Patient ID: Stacey Taylor, female    DOB: 22-Apr-1981, 41 y.o.   MRN: [id number]  Stacey Taylor is a 41 y.o. female presenting on 08/22/2022 for Back Pain   HPI  Back Pain, Low back, spasms Moved at end of August 2023, and she did not get a ground floor apt Now she has been climbing stairs every day to 2nd floor She admits some issue with worsening knee pain at times episodic New episode earlier this week 3 days ago, lifted 5 gallon water jug and did not feel problem at that time. She said next day felt some twitch or spasm in her back, she said hot shower it felt better, and she felt sudden spasm onset into across lower back and radiating into her buttocks region. She tried ibuprofen PRN without relief. She has been very active yesterday. She tried heating pad without relief. Woke up a bit better today. Started moving around and symptoms flared up a bit. - She found cyclobenzaprine old rx 2020 for '5mg'$  dose, and said it helped but did not resolve. Needs new order since med is expired  No weight loss on Mounjaro Coming in next week for follow-up after labs.  Health Maintenance: She will return soon for physical for labs and flu shot.     03/14/2022    9:05 AM 05/29/2021    1:42 PM 03/28/2020    9:23 AM  Depression screen PHQ 2/9  Decreased Interest 1 0 0  Down, Depressed, Hopeless 0 0 0  PHQ - 2 Score 1 0 0  Altered sleeping 1 0   Tired, decreased energy 1 1   Change in appetite 0 1   Feeling bad or failure about yourself  0 0   Trouble concentrating 0 0   Moving slowly or fidgety/restless 0 0   Suicidal thoughts 0 0   PHQ-9 Score 3 2   Difficult doing work/chores Not difficult at all Not difficult at all     Social History   Tobacco Use   Smoking status: Never   Smokeless tobacco: Never  Vaping Use   Vaping Use: Never used  Substance Use Topics   Alcohol use: No    Alcohol/week: 0.0 standard drinks of alcohol   Drug use: No    Review of  Systems Per HPI unless specifically indicated above     Objective:    BP (!) 158/80   Pulse 78   Resp 16   Ht 5' (1.524 m)   Wt 270 lb (122.5 kg)   LMP 01/06/2020   SpO2 97%   BMI 52.73 kg/m   Wt Readings from Last 3 Encounters:  08/22/22 270 lb (122.5 kg)  03/14/22 271 lb 6.4 oz (123.1 kg)  11/11/21 272 lb (123.4 kg)    Physical Exam Vitals and nursing note reviewed.  Constitutional:      General: She is not in acute distress.    Appearance: Normal appearance. She is well-developed. She is obese. She is not diaphoretic.     Comments: Well-appearing, comfortable, cooperative  HENT:     Head: Normocephalic and atraumatic.  Eyes:     General:        Right eye: No discharge.        Left eye: No discharge.     Conjunctiva/sclera: Conjunctivae normal.  Cardiovascular:     Rate and Rhythm: Normal rate.  Pulmonary:     Effort: Pulmonary effort is normal.  Musculoskeletal:  Comments: Bilateral low back with hypertonicity spasm  Skin:    General: Skin is warm and dry.     Findings: No erythema or rash.  Neurological:     Mental Status: She is alert and oriented to person, place, and time.  Psychiatric:        Mood and Affect: Mood normal.        Behavior: Behavior normal.        Thought Content: Thought content normal.     Comments: Well groomed, good eye contact, normal speech and thoughts       Results for orders placed or performed during the hospital encounter of 03/12/22  POCT rapid strep A  Result Value Ref Range   Rapid Strep A Screen Negative Negative      Assessment & Plan:   Problem List Items Addressed This Visit   None Visit Diagnoses     Spasm of muscle of lower back    -  Primary   Relevant Medications   cyclobenzaprine (FLEXERIL) 5 MG tablet       Acute on chronic bilateral LBP without sciatica with back spasms Suspect likely due to muscle spasm/strain, with minor lifting injury No prior surgery No recent imaging - No red flag  symptoms. Negative SLR for radiculopathy - Inadequate conservative therapy   Plan: 1. Re order - Start muscle relaxant with Flexeril '5mg'$  tabs - take 5-'10mg'$  up to TID PRN, titrate up as tolerated caution sedation 2. May use Tylenol PRN for breakthrough 3. Encouraged use of heating pad 1-2x daily for now then PRN 4.  Follow-up 4-6 weeks if not improved for re-evaluation, consider X-ray imaging, trial of PT, and possibly referral to Orthopedic   Meds ordered this encounter  Medications   cyclobenzaprine (FLEXERIL) 5 MG tablet    Sig: Take 1-2 tablets (5-10 mg total) by mouth 3 (three) times daily as needed for muscle spasms.    Dispense:  60 tablet    Refill:  2      Follow up plan: Return in about 1 week (around 08/29/2022) for within 1 week will need AM fasting lab only then 1-2 days later Follow-up Back, Lab results.  Future labs ordered for 08/25/22 - CMET, CBC, Lipid, A1c, TSH, T4, TB Quantiferon    Nobie Putnam, DO [facility] 08/22/2022, 11:34 AM

## 2022-08-25 ENCOUNTER — Other Ambulatory Visit: Payer: 59

## 2022-08-25 DIAGNOSIS — E559 Vitamin D deficiency, unspecified: Secondary | ICD-10-CM | POA: Diagnosis not present

## 2022-08-25 DIAGNOSIS — R7303 Prediabetes: Secondary | ICD-10-CM | POA: Diagnosis not present

## 2022-08-25 DIAGNOSIS — Z6841 Body Mass Index (BMI) 40.0 and over, adult: Secondary | ICD-10-CM | POA: Diagnosis not present

## 2022-08-25 DIAGNOSIS — Z111 Encounter for screening for respiratory tuberculosis: Secondary | ICD-10-CM | POA: Diagnosis not present

## 2022-08-25 DIAGNOSIS — E538 Deficiency of other specified B group vitamins: Secondary | ICD-10-CM | POA: Diagnosis not present

## 2022-08-25 DIAGNOSIS — I10 Essential (primary) hypertension: Secondary | ICD-10-CM | POA: Diagnosis not present

## 2022-08-25 DIAGNOSIS — E7889 Other lipoprotein metabolism disorders: Secondary | ICD-10-CM | POA: Diagnosis not present

## 2022-08-25 DIAGNOSIS — D649 Anemia, unspecified: Secondary | ICD-10-CM | POA: Diagnosis not present

## 2022-08-29 ENCOUNTER — Encounter: Payer: Self-pay | Admitting: Family Medicine

## 2022-08-29 ENCOUNTER — Other Ambulatory Visit: Payer: Self-pay

## 2022-08-29 ENCOUNTER — Ambulatory Visit: Payer: 59 | Admitting: Family Medicine

## 2022-08-29 VITALS — BP 172/103 | HR 88 | Ht 60.0 in | Wt 267.0 lb

## 2022-08-29 DIAGNOSIS — R7303 Prediabetes: Secondary | ICD-10-CM

## 2022-08-29 DIAGNOSIS — I10 Essential (primary) hypertension: Secondary | ICD-10-CM

## 2022-08-29 DIAGNOSIS — Z23 Encounter for immunization: Secondary | ICD-10-CM | POA: Diagnosis not present

## 2022-08-29 DIAGNOSIS — Z6841 Body Mass Index (BMI) 40.0 and over, adult: Secondary | ICD-10-CM

## 2022-08-29 DIAGNOSIS — J329 Chronic sinusitis, unspecified: Secondary | ICD-10-CM | POA: Diagnosis not present

## 2022-08-29 MED ORDER — AMOXICILLIN-POT CLAVULANATE 875-125 MG PO TABS
1.0000 | ORAL_TABLET | Freq: Two times a day (BID) | ORAL | 0 refills | Status: DC
  Filled 2022-08-29: qty 20, 10d supply, fill #0

## 2022-08-29 NOTE — Progress Notes (Unsigned)
Subjective:    Patient ID: Stacey Taylor, female    DOB: 1981/01/26, 41 y.o.   MRN: [id number]  Stacey Taylor is a 41 y.o. female presenting on 08/29/2022 for Sinusitis and Back Pain   HPI  Morbid Obesity BMI >53 Lifestyle goals to improve weight loss in future, interested in medication again. Previously on sample of Wegovy in past. She has tried sample Saxenda for 3 weeks, no significant wt loss due to other factors, says fluid swelling weight. She admits appetite did get reduced  ***Previously referred to [facility] in 2020 ***Saxenda  Sinusitis Previously unable to get rid of mucus from prior Persistent sinus drainage phlegm Previously treated with Augmentin steroid She reports new onset last night and this AM  Elevated Platelets Chronic problem  Health Maintenance: ***     03/14/2022    9:05 AM 05/29/2021    1:42 PM 03/28/2020    9:23 AM  Depression screen PHQ 2/9  Decreased Interest 1 0 0  Down, Depressed, Hopeless 0 0 0  PHQ - 2 Score 1 0 0  Altered sleeping 1 0   Tired, decreased energy 1 1   Change in appetite 0 1   Feeling bad or failure about yourself  0 0   Trouble concentrating 0 0   Moving slowly or fidgety/restless 0 0   Suicidal thoughts 0 0   PHQ-9 Score 3 2   Difficult doing work/chores Not difficult at all Not difficult at all     Social History   Tobacco Use   Smoking status: Never   Smokeless tobacco: Never  Vaping Use   Vaping Use: Never used  Substance Use Topics   Alcohol use: No    Alcohol/week: 0.0 standard drinks of alcohol   Drug use: No    Review of Systems Per HPI unless specifically indicated above     Objective:    BP (!) 172/103   Pulse 88   Ht 5' (1.524 m)   Wt 267 lb (121.1 kg)   LMP 01/06/2020   SpO2 99%   BMI 52.14 kg/m   Wt Readings from Last 3 Encounters:  08/29/22 267 lb (121.1 kg)  08/22/22 270 lb (122.5 kg)  03/14/22 271 lb 6.4 oz (123.1 kg)    Physical Exam    Results for orders placed or  performed in visit on 08/22/22  Vitamin B12  Result Value Ref Range   Vitamin B-12 256 200 - 1,100 pg/mL  VITAMIN D 25 Hydroxy (Vit-D Deficiency, Fractures)  Result Value Ref Range   Vit D, 25-Hydroxy 27 (L) 30 - 100 ng/mL  T4, free  Result Value Ref Range   Free T4 1.1 0.8 - 1.8 ng/dL  TSH  Result Value Ref Range   TSH 2.77 mIU/L  Hemoglobin A1c  Result Value Ref Range   Hgb A1c MFr Bld 5.7 (H) <5.7 % of total Hgb   Mean Plasma Glucose 117 mg/dL   eAG (mmol/L) 6.5 mmol/L  Lipid panel  Result Value Ref Range   Cholesterol 132 <200 mg/dL   HDL 46 (L) > OR = 50 mg/dL   Triglycerides 80 <150 mg/dL   LDL Cholesterol (Calc) 70 mg/dL (calc)   Total CHOL/HDL Ratio 2.9 <5.0 (calc)   Non-HDL Cholesterol (Calc) 86 <130 mg/dL (calc)  CBC with Differential/Platelet  Result Value Ref Range   WBC 7.1 3.8 - 10.8 Thousand/uL   RBC 3.99 3.80 - 5.10 Million/uL   Hemoglobin 11.3 (L) 11.7 - 15.5 g/dL  HCT 33.4 (L) 35.0 - 45.0 %   MCV 83.7 80.0 - 100.0 fL   MCH 28.3 27.0 - 33.0 pg   MCHC 33.8 32.0 - 36.0 g/dL   RDW 13.3 11.0 - 15.0 %   Platelets 407 (H) 140 - 400 Thousand/uL   MPV 9.4 7.5 - 12.5 fL   Neutro Abs 4,445 1,500 - 7,800 cells/uL   Lymphs Abs 2,073 850 - 3,900 cells/uL   Absolute Monocytes 412 200 - 950 cells/uL   Eosinophils Absolute 142 15 - 500 cells/uL   Basophils Absolute 28 0 - 200 cells/uL   Neutrophils Relative % 62.6 %   Total Lymphocyte 29.2 %   Monocytes Relative 5.8 %   Eosinophils Relative 2.0 %   Basophils Relative 0.4 %  COMPLETE METABOLIC PANEL WITH GFR  Result Value Ref Range   Glucose, Bld 122 (H) 65 - 99 mg/dL   BUN 8 7 - 25 mg/dL   Creat 0.52 0.50 - 0.99 mg/dL   eGFR 120 > OR = 60 mL/min/1.63m   BUN/Creatinine Ratio SEE NOTE: 6 - 22 (calc)   Sodium 140 135 - 146 mmol/L   Potassium 3.9 3.5 - 5.3 mmol/L   Chloride 106 98 - 110 mmol/L   CO2 26 20 - 32 mmol/L   Calcium 8.8 8.6 - 10.2 mg/dL   Total Protein 7.0 6.1 - 8.1 g/dL   Albumin 3.6 3.6 - 5.1  g/dL   Globulin 3.4 1.9 - 3.7 g/dL (calc)   AG Ratio 1.1 1.0 - 2.5 (calc)   Total Bilirubin 0.4 0.2 - 1.2 mg/dL   Alkaline phosphatase (APISO) 64 31 - 125 U/L   AST 12 10 - 30 U/L   ALT 14 6 - 29 U/L      Assessment & Plan:   Problem List Items Addressed This Visit     Essential hypertension   Morbid obesity with BMI of 50.0-59.9, adult (HCC) - Primary   Pre-diabetes   Other Visit Diagnoses     Recurrent sinusitis       Relevant Medications   amoxicillin-clavulanate (AUGMENTIN) 875-125 MG tablet   Other Relevant Orders   Ambulatory referral to ENT   Needs flu shot       Relevant Orders   Flu Vaccine QUAD 673moM (Fluarix, Fluzone & Alfiuria Quad PF)       Meds ordered this encounter  Medications   amoxicillin-clavulanate (AUGMENTIN) 875-125 MG tablet    Sig: Take 1 tablet by mouth 2 (two) times daily.    Dispense:  20 tablet    Refill:  0      Follow up plan: Return in about 4 months (around 12/30/2022) for 4 month PreDM A1c, Weight Management.  AlNobie PutnamDOFordsvilleedical Group 08/29/2022, 11:27 AM

## 2022-08-29 NOTE — Patient Instructions (Addendum)
Thank you for coming to the office today.  Options for Weight next year 2024 With new insurance We can check with insurance coverage and medication options, maybe Wegovy or a new medication is available. Consider the Contrave pills Consider referral again to Bariatric  Augmentin course  [facility] Referral  Flu Shot  Please schedule a Follow-up Appointment to: Return in about 4 months (around 12/30/2022) for 4 month PreDM A1c, Weight Management.  If you have any other questions or concerns, please feel free to call the office or send a message through [place]. You may also schedule an earlier appointment if necessary.  Additionally, you may be receiving a survey about your experience at our office within a few days to 1 week by e-mail or mail. We value your feedback.  Nobie Putnam, DO [place]

## 2022-08-30 LAB — CBC WITH DIFFERENTIAL/PLATELET
Absolute Monocytes: 412 cells/uL (ref 200–950)
Basophils Absolute: 28 cells/uL (ref 0–200)
Basophils Relative: 0.4 %
Eosinophils Absolute: 142 cells/uL (ref 15–500)
Eosinophils Relative: 2 %
HCT: 33.4 % — ABNORMAL LOW (ref 35.0–45.0)
Hemoglobin: 11.3 g/dL — ABNORMAL LOW (ref 11.7–15.5)
Lymphs Abs: 2073 cells/uL (ref 850–3900)
MCH: 28.3 pg (ref 27.0–33.0)
MCHC: 33.8 g/dL (ref 32.0–36.0)
MCV: 83.7 fL (ref 80.0–100.0)
MPV: 9.4 fL (ref 7.5–12.5)
Monocytes Relative: 5.8 %
Neutro Abs: 4445 cells/uL (ref 1500–7800)
Neutrophils Relative %: 62.6 %
Platelets: 407 10*3/uL — ABNORMAL HIGH (ref 140–400)
RBC: 3.99 10*6/uL (ref 3.80–5.10)
RDW: 13.3 % (ref 11.0–15.0)
Total Lymphocyte: 29.2 %
WBC: 7.1 10*3/uL (ref 3.8–10.8)

## 2022-08-30 LAB — LIPID PANEL
Cholesterol: 132 mg/dL (ref <200)
HDL: 46 mg/dL — ABNORMAL LOW (ref >=50)
LDL Cholesterol (Calc): 70 mg/dL (calc)
Non-HDL Cholesterol (Calc): 86 mg/dL (calc) (ref <130)
Total CHOL/HDL Ratio: 2.9 (calc) (ref <5.0)
Triglycerides: 80 mg/dL (ref <150)

## 2022-08-30 LAB — COMPLETE METABOLIC PANEL WITH GFR
AG Ratio: 1.1 (calc) (ref 1.0–2.5)
ALT: 14 U/L (ref 6–29)
AST: 12 U/L (ref 10–30)
Albumin: 3.6 g/dL (ref 3.6–5.1)
Alkaline phosphatase (APISO): 64 U/L (ref 31–125)
BUN: 8 mg/dL (ref 7–25)
CO2: 26 mmol/L (ref 20–32)
Calcium: 8.8 mg/dL (ref 8.6–10.2)
Chloride: 106 mmol/L (ref 98–110)
Creat: 0.52 mg/dL (ref 0.50–0.99)
Globulin: 3.4 g/dL (calc) (ref 1.9–3.7)
Glucose, Bld: 122 mg/dL — ABNORMAL HIGH (ref 65–99)
Potassium: 3.9 mmol/L (ref 3.5–5.3)
Sodium: 140 mmol/L (ref 135–146)
Total Bilirubin: 0.4 mg/dL (ref 0.2–1.2)
Total Protein: 7 g/dL (ref 6.1–8.1)
eGFR: 120 mL/min/{1.73_m2} (ref >=60)

## 2022-08-30 LAB — VITAMIN D 25 HYDROXY (VIT D DEFICIENCY, FRACTURES): Vit D, 25-Hydroxy: 27 ng/mL — ABNORMAL LOW (ref 30–100)

## 2022-08-30 LAB — VITAMIN B12: Vitamin B-12: 256 pg/mL (ref 200–1100)

## 2022-08-30 LAB — QUANTIFERON-TB GOLD PLUS
Mitogen-NIL: 8.31 IU/mL
NIL: 0.02 IU/mL
QuantiFERON-TB Gold Plus: NEGATIVE
TB1-NIL: 0 IU/mL
TB2-NIL: 0.01 IU/mL

## 2022-08-30 LAB — HEMOGLOBIN A1C
Hgb A1c MFr Bld: 5.7 % of total Hgb — ABNORMAL HIGH (ref <5.7)
Mean Plasma Glucose: 117 mg/dL
eAG (mmol/L): 6.5 mmol/L

## 2022-08-30 LAB — TSH: TSH: 2.77 mIU/L

## 2022-08-30 LAB — T4, FREE: Free T4: 1.1 ng/dL (ref 0.8–1.8)

## 2022-09-01 ENCOUNTER — Encounter (INDEPENDENT_AMBULATORY_CARE_PROVIDER_SITE_OTHER): Payer: Self-pay

## 2022-09-22 DIAGNOSIS — G4733 Obstructive sleep apnea (adult) (pediatric): Secondary | ICD-10-CM | POA: Diagnosis not present

## 2022-10-22 ENCOUNTER — Ambulatory Visit (INDEPENDENT_AMBULATORY_CARE_PROVIDER_SITE_OTHER): Payer: 59 | Admitting: Primary Care

## 2022-10-22 ENCOUNTER — Other Ambulatory Visit: Payer: Self-pay

## 2022-10-22 ENCOUNTER — Encounter: Payer: Self-pay | Admitting: Primary Care

## 2022-10-22 VITALS — BP 128/78 | HR 83 | Temp 97.8°F | Ht 60.0 in | Wt 273.2 lb

## 2022-10-22 DIAGNOSIS — G4733 Obstructive sleep apnea (adult) (pediatric): Secondary | ICD-10-CM | POA: Diagnosis not present

## 2022-10-22 DIAGNOSIS — J310 Chronic rhinitis: Secondary | ICD-10-CM | POA: Diagnosis not present

## 2022-10-22 MED ORDER — AZELASTINE HCL 0.1 % NA SOLN
2.0000 | Freq: Two times a day (BID) | NASAL | 2 refills | Status: DC
  Filled 2022-10-22: qty 30, 50d supply, fill #0

## 2022-10-22 NOTE — Progress Notes (Signed)
_0  ID: Stacey Taylor, female    DOB: Jun 16, 1981, 42 y.o.   MRN: [id number]  Chief Complaint  Patient presents with   Follow-up    AHI has increased over the past 3 months. Wearing cpap avg 6-8hr nightly.     Referring provider: Nobie Putnam *  HPI: 41 year old female, never smoked.  Past medical history significant for obstructive sleep apnea, essential hypertension, chronic back pain, dyspnea on exertion, prediabetes, lymphedema, morbid obesity.  Patient self-referred to [facility] pulmonary for obstructive sleep apnea. PSG 03/31/20 severe OSA, AHI 155/hr requiring BIPAP. Maintained on lasix 52m twice daily as needed for leg swelling.   Previous [facility] pulmomary encounter: 07/10/2020- Sleep consult Patient presents today for self referral. She needs new CPAP machine. Previous sleep study: PSG 03/31/20 severe OSA, AHI 155/hr.  BIPAP titration study recommended 25/12, PS 4. Complicated by frequent oxygen desaturations. Weight 283 lbs. ResMed N20 small mask. Humidified oxygen. She had an over night oximetry test that did not show nocturnal oxygen desaturation.   Patient reports compliance with BIPAP, improved sleep with use. She had a normal echocardiogram in 2019. She endorses loud snoring. She reports gaining a significant amount of weight in the last year. She had hysterectomy d/t endometriosis in May 2021. During procedure her oxygen saturation was low. After this [facility] She tells me that she needed oxygen during her study. She is currently wearing Bipap. Data reviewed on her phone which showed average events 3.6 with moderate airleaks. She still reports feeling sluggish and tired. She is using nasal mask. [facility].   Sleep questionnaire  Symptoms: Loud snoring, daytime fatigue  Previous sleep study: PSG 03/31/20 severe OSA, AHI 155/hr.   Bed Time: 10-11pm Time to fall asleep: 5-155ms Wake Time: 7am [place] 10/24  10/03/2020 Patient contacted today to review titration study.  BiPAP titration study completed on 09/25/2020.  AHI was 6.6 an hour.  He had 17 obstructive events, 21 hypopneas, 1 central events, 0 mixed events.  Basal oxygen saturation was 97% with SPO2 low 83%.  Time below 88% was 1.1 minutes. Recommend BiPAP therapy with pressure setting of 18/11cm H2O. She did not require supplemental oxygen. Reviewed titration study with her. She was concerned because she was told she had to wear full face mask, she had a very difficult time tolerating this and feels her study may not have been accurate. She feels strongly about staying on nasal nasal pillow mask. Discussed adding chin strap. She continue to complain of fatigue despite BIPAP use. She has a hx cardiac murmur. Her last echocardiogram was in 2019.   11/07/2020 Patient presents today for 1 month follow-up. She had a titration study on 09/25/20 that showed she did not require oxygen. During last virtual visit we changed BIPAP setting to recommended 18/11cm h20 and added a chin strap to nasal mask. Echocardiogram was obtained d/t patient reports or fatigue and DOE. Echo showed normal EF 60-65%, mild LVH and grade 2 DD. She is on lasix 4036mID as needed for leg swelling.   She is doing well, no acute complaints. She is compliant with BIPAP. Wearing nasal mask. She was provided with the wrong chin strap and needs to exchange with her DME company. Reports that her water reservoir for machine occasionally runs out. She has an apt with cardiology tomorrow. She has some LE swelling.  Takes lasix when able.   Airview download 10/08/20-11/06/19 29/30 days used; 100% > 4 hours Average usage: 8 hours 39 mins Pressure: IPAP 18; EPAP 11 cm h20 Airleaks: 25L/min (95%) AHI 2.4    10/22/2022 - interim hx Patient presents today for annual follow-up/ OSA. She is doing well, getting 6-8  hours of sleep. She works nights and recently been noticing more events on [employer] on her phone. She is sleeping alright, wanted to make sure settings were ok. Current BIPAP pressure 18/11cm h20; Residual AHI 3.0/hr.  She typically uses nasal pillow mask but at times uses regular nasal mask. She has chronic nasal congestion.   Airview download 09/21/2022 - 10/20/2022 Usage days 30/30 days (100%); 29 days (97%) greater than 4 hours Average usage 8 hours 23 minutes IPAP 18 cm H2O; EPAP 11 cm H2O Air leaks 27.5 L/min (95%) AHI 3.0  Allergies  Allergen Reactions   Latex Hives   Tramadol     Chest pain    Immunization History  Administered Date(s) Administered   Influenza,inj,Quad PF,6+ Mos 07/30/2017, 07/22/2018, 09/09/2019, 08/31/2020, 09/02/2021, 08/29/2022   Influenza-Unspecified 08/07/2016   Moderna Sars-Covid-2 Vaccination 07/13/2020, 08/16/2020    Past Medical History:  Diagnosis Date   Abnormal Pap smear of anus    HGSIL   Anemia    Back pain    Cervical dysplasia    CIN III   CHF (congestive heart failure) (HCC)    Endometriosis    Gallbladder problem    Headache    Heart murmur    Leg edema    Premature delivery    x2    Tobacco History: Social History   Tobacco Use  Smoking Status Never  Smokeless Tobacco Never   Counseling given: Not Answered   Outpatient Medications Prior to Visit  Medication Sig Dispense Refill   COVID-19 At Home Antigen Test (CARESTART COVID-19 HOME TEST) KIT Use as directed 2 kit 0   cyclobenzaprine (FLEXERIL) 5 MG tablet Take 1-2 tablets (5-10 mg total) by mouth 3 (three) times daily as needed for muscle spasms. 60 tablet 2   liothyronine (CYTOMEL) 5 MCG tablet 1 tablet on an empty stomach, after 7 days increase to 2 tablets Once a day 30 day(s) 60 tablet 1   amoxicillin-clavulanate (AUGMENTIN) 875-125 MG tablet Take 1 tablet by mouth 2 (two) times daily. 20 tablet 0   furosemide (LASIX) 40 MG tablet TAKE 1 TABLET BY MOUTH  DAILY AS NEEDED FOR FLUID OR EDEMA. MAY TAKE A 2ND DOSE IN 24 HOURS IF NEEDED. 45 tablet 3   mupirocin ointment (BACTROBAN) 2 % Apply 1 Application topically 2 (two) times daily. 22 g 0   ondansetron (ZOFRAN-ODT) 4 MG disintegrating tablet Take 1 tablet (4 mg total) by mouth every 8 (eight) hours as needed for nausea or vomiting. 12 tablet 0   No facility-administered medications prior to visit.    Review of Systems  Review of Systems  Constitutional: Negative.   HENT:  Positive for congestion and postnasal drip.   Respiratory: Negative.      Physical Exam  BP 128/78 (BP Location: Left Arm, Cuff Size: Large)   Pulse 83   Temp 97.8 F (36.6 C) (Temporal)   Ht 5' (1.524 m)   Wt 273 lb 3.2 oz (123.9 kg)   LMP 01/06/2020   SpO2 98%   BMI 53.36 kg/m  Physical Exam Constitutional:      Appearance: Normal appearance.  HENT:     Head: Normocephalic and atraumatic.  Cardiovascular:     Rate and Rhythm: Normal rate and regular rhythm.     Comments: Leg swelling Pulmonary:     Effort: Pulmonary effort is normal.     Breath sounds: Normal breath sounds. No wheezing, rhonchi or rales.  Musculoskeletal:        General: Normal range of motion.  Skin:    General: Skin is warm and dry.  Neurological:     General: No focal deficit present.     Mental Status: She is alert and oriented to person, place, and time. Mental status is at baseline.  Psychiatric:        Mood and Affect: Mood normal.        Behavior: Behavior normal.        Thought Content: Thought content normal.        Judgment: Judgment normal.      Lab Results:  CBC    Component Value Date/Time   WBC 7.1 08/25/2022 0753   RBC 3.99 08/25/2022 0753   HGB 11.3 (L) 08/25/2022 0753   HGB 11.7 06/13/2021 0740   HCT 33.4 (L) 08/25/2022 0753   HCT 34.8 06/13/2021 0740   PLT 407 (H) 08/25/2022 0753   PLT 443 06/13/2021 0740   MCV 83.7 08/25/2022 0753   MCV 83 06/13/2021 0740   MCV 86 11/09/2013 1059   MCH 28.3  08/25/2022 0753   MCHC 33.8 08/25/2022 0753   RDW 13.3 08/25/2022 0753   RDW 13.2 06/13/2021 0740   RDW 14.0 11/09/2013 1059   LYMPHSABS 2,073 08/25/2022 0753   LYMPHSABS 2.5 06/13/2021 0740   LYMPHSABS 1.9 11/09/2013 1059   MONOABS 0.6 11/09/2013 1059   EOSABS 142 08/25/2022 0753   EOSABS 0.1 06/13/2021 0740   EOSABS 0.0 11/09/2013 1059   BASOSABS 28 08/25/2022 0753   BASOSABS 0.0 06/13/2021 0740   BASOSABS 0.0 11/09/2013 1059    BMET    Component Value Date/Time   NA 140 08/25/2022 0753   NA 138 06/13/2021 0740   NA 139 01/14/2013 0150   K 3.9 08/25/2022 0753   K 3.6 01/14/2013 0150   CL 106 08/25/2022 0753   CL 107 01/14/2013 0150   CO2 26 08/25/2022 0753   CO2 23 01/14/2013 0150   GLUCOSE 122 (H) 08/25/2022 0753   GLUCOSE 105 (H) 01/14/2013 0150   BUN 8 08/25/2022 0753   BUN 7 06/13/2021 0740   BUN 10 01/14/2013 0150   CREATININE 0.52 08/25/2022 0753   CALCIUM 8.8 08/25/2022 0753   CALCIUM 8.6 01/14/2013 0150   GFRNONAA >60 05/24/2021 0154   GFRNONAA 113 07/08/2018 0827   GFRAA >60 03/09/2020 0626   GFRAA 131 07/08/2018 0827    BNP    Component Value Date/Time   BNP 21.6 05/24/2021 0154   BNP 18 07/23/2017 0903    ProBNP No results found for: "PROBNP"  Imaging: No results found.   Assessment & Plan:   OSA (obstructive sleep apnea) - Download looks good. She is 100% compliance with BIPAP use the last 30 days. Her sleep apnea is well controlled on current BIPAP settings 18/11cm h20. No significant residual apneas. Advised she uses nasal spray to help with nasal congestion and return to ENT. Also recommend she try using wedge pillow to elevate her head at night while sleeping which may help occasional apneas she is having. No pressure settings needed today, if sleep becomes disrupted or she has a lot of residual fatigue would recommend going in to lab for BIPAP  titration study.   Chronic rhinitis - Astelin nasal spray as directed  - Start saline nasal  rinses 1-2 times a day as needed for nasal congestion - Follow up with ENT   Martyn Ehrich, NP 11/03/2022

## 2022-10-22 NOTE — Assessment & Plan Note (Addendum)
-   Download looks good. She is 100% compliance with BIPAP use the last 30 days. Her sleep apnea is well controlled on current BIPAP settings 18/11cm h20. No significant residual apneas. Advised she uses nasal spray to help with nasal congestion and return to ENT. Also recommend she try using wedge pillow to elevate her head at night while sleeping which may help occasional apneas she is having. No pressure settings needed today, if sleep becomes disrupted or she has a lot of residual fatigue would recommend going in to lab for BIPAP titration study.

## 2022-10-22 NOTE — Patient Instructions (Addendum)
Download looks good. Sleep apnea is well controlled on current BIPAP settings. No significant residual apneas   Recommendations Astelin nasal spray as directed  Start saline nasal rinses 1-2 times a day as needed for nasal congestion Follow up with ENT Continue to wear CPAP every night 4-6 hours or longer Try using wedge pillow again   Follow-up 1 year with Beth NP or sooner if needed

## 2022-10-28 DIAGNOSIS — M1712 Unilateral primary osteoarthritis, left knee: Secondary | ICD-10-CM | POA: Diagnosis not present

## 2022-10-28 DIAGNOSIS — M25562 Pain in left knee: Secondary | ICD-10-CM | POA: Diagnosis not present

## 2022-11-02 ENCOUNTER — Other Ambulatory Visit: Payer: Self-pay

## 2022-11-03 DIAGNOSIS — J310 Chronic rhinitis: Secondary | ICD-10-CM | POA: Insufficient documentation

## 2022-11-03 NOTE — Assessment & Plan Note (Signed)
-   Astelin nasal spray as directed  - Start saline nasal rinses 1-2 times a day as needed for nasal congestion - Follow up with ENT

## 2022-11-27 DIAGNOSIS — M25461 Effusion, right knee: Secondary | ICD-10-CM | POA: Diagnosis not present

## 2022-12-08 DIAGNOSIS — M170 Bilateral primary osteoarthritis of knee: Secondary | ICD-10-CM | POA: Diagnosis not present

## 2023-01-12 ENCOUNTER — Encounter: Payer: Self-pay | Admitting: Podiatry

## 2023-01-12 ENCOUNTER — Ambulatory Visit: Payer: 59 | Admitting: Family Medicine

## 2023-01-12 ENCOUNTER — Ambulatory Visit (INDEPENDENT_AMBULATORY_CARE_PROVIDER_SITE_OTHER): Payer: 59 | Admitting: Podiatry

## 2023-01-12 VITALS — BP 154/75 | HR 73

## 2023-01-12 DIAGNOSIS — R600 Localized edema: Secondary | ICD-10-CM

## 2023-01-12 DIAGNOSIS — B070 Plantar wart: Secondary | ICD-10-CM

## 2023-01-12 DIAGNOSIS — R609 Edema, unspecified: Secondary | ICD-10-CM

## 2023-01-12 NOTE — Progress Notes (Signed)
  Subjective:  Patient ID: Stacey Taylor, female    DOB: 1981/04/23,  MRN: [id number]  Chief Complaint  Patient presents with   Plantar Warts    "I think I have Plantar Warts on the bottom of my feet." N - Calluses L - bilateral - 1st met left, 3/4 right D - February 2024 O - suddenly, gotten worse C - tender, aches A - walking, standing, pressure T - Dr. Felicie Morn wart remover   Foot Pain    "I'd like to get a prescription for compression hose for the swelling."    42 y.o. female presents with the above complaint. History confirmed with patient.    Objective:  Physical Exam: warm, good capillary refill, no trophic changes or ulcerative lesions, normal DP and PT pulses, normal sensory exam, and peripheral edema noted, she has verruca plantaris submetatarsal 4 on the right foot and submetatarsal 1 on the left foot.  Assessment:   1. Verruca plantaris   2. Peripheral edema      Plan:  Patient was evaluated and treated and all questions answered.  Rx for 20 to 30 mmHg thigh-high compression stockings given, she will be going to be fitted for these  Discussed etiology and treatment of verruca plantaris in detail with the patient as well as multiple treatment options including blistering agents, chemotherapeutic agents, surgical excision, laser therapy and the indications and roles of the above.  Today, recommended treatment with Cantharone as noted in procedure note below.  If lesion persistent she will return to using salicylic acid pads at night.  Return to see me as needed if it does not improve or continues to worsen  Procedure: Destruction of Lesion Location: Right foot submetatarsal 4, left foot submetatarsal 1 Instrumentation: 15 blade. Technique: Debridement of lesion to petechial bleeding. Aperture pad applied around lesion. Small amount of canthrone applied to the base of the lesion. Dressing: Dry, sterile, compression dressing. Disposition: Patient tolerated  procedure well. Advised to leave dressing on for 6-8 hours. Thereafter patient to wash the area with soap and water and applied band-aid.    Return if symptoms worsen or fail to improve.

## 2023-01-12 NOTE — Patient Instructions (Signed)
Take dressing off in 8 hours and wash the foot with soap and water. If it is hurting or becomes uncomfortable before the 8 hours, go ahead and remove the bandage and wash the area.  If it blisters, apply antibiotic ointment and a band-aid.  Monitor for any signs/symptoms of infection. Call the office immediately if any occur or go directly to the emergency room. Call with any questions/concerns.   

## 2023-01-20 ENCOUNTER — Other Ambulatory Visit: Payer: Self-pay

## 2023-01-20 ENCOUNTER — Encounter: Payer: Self-pay | Admitting: Family Medicine

## 2023-01-20 ENCOUNTER — Telehealth (INDEPENDENT_AMBULATORY_CARE_PROVIDER_SITE_OTHER): Payer: 59 | Admitting: Family Medicine

## 2023-01-20 VITALS — Wt 273.0 lb

## 2023-01-20 DIAGNOSIS — U071 COVID-19: Secondary | ICD-10-CM

## 2023-01-20 MED ORDER — BENZONATATE 100 MG PO CAPS
100.0000 mg | ORAL_CAPSULE | Freq: Three times a day (TID) | ORAL | 0 refills | Status: DC | PRN
  Filled 2023-01-20: qty 30, 10d supply, fill #0

## 2023-01-20 MED ORDER — NIRMATRELVIR/RITONAVIR (PAXLOVID)TABLET
3.0000 | ORAL_TABLET | Freq: Two times a day (BID) | ORAL | 0 refills | Status: AC
  Filled 2023-01-20: qty 30, 5d supply, fill #0

## 2023-01-20 NOTE — Progress Notes (Addendum)
Subjective:    Patient ID: Stacey Taylor, female    DOB: [birth year], 42 y.o.   MRN: [id number]  Stacey Taylor is a 42 y.o. female presenting on 01/20/2023 for Covid Positive  Virtual / Telehealth Encounter - Video Visit via [place] The purpose of this virtual visit is to provide medical care while limiting exposure to the novel coronavirus (COVID19) for both patient and office staff.  Consent was obtained for remote visit:  Yes.   Answered questions that patient had about telehealth interaction:  Yes.   I discussed the limitations, risks, security and privacy concerns of performing an evaluation and management service by video/telephone. I also discussed with the patient that there may be a patient responsible charge related to this service. The patient expressed understanding and agreed to proceed.  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 She wears a mask at work. She would take mask off at night when patient goes to bed. She admits her client was coughing and sneezing some when went outside.  Reports symptoms onset yesterday sore throat, and congestion drainage Today she felt pressure and sore throat, admits some body aches and low grade fever  COVID at home test today POSITIVE   Health Maintenance: Previous COVID vaccine series. Do not have documented recent update     03/14/2022    9:05 AM 05/29/2021    1:42 PM 03/28/2020    9:23 AM  Depression screen PHQ 2/9  Decreased Interest 1 0 0  Down, Depressed, Hopeless 0 0 0  PHQ - 2 Score 1 0 0  Altered sleeping 1 0   Tired, decreased energy 1 1   Change in appetite 0 1   Feeling bad or  failure about yourself  0 0   Trouble concentrating 0 0   Moving slowly or fidgety/restless 0 0   Suicidal thoughts 0 0   PHQ-9 Score 3 2   Difficult doing work/chores Not difficult at all Not difficult at all     Social History   Tobacco Use   Smoking status: Never   Smokeless tobacco: Never  Vaping Use   Vaping Use: Never used  Substance Use Topics   Alcohol use: No    Alcohol/week: 0.0 standard drinks of alcohol   Drug use: No    Review of Systems Per HPI unless specifically indicated above     Objective:    Wt 273 lb (123.8 kg)   LMP 01/06/2020   BMI 53.32 kg/m   Wt Readings from Last 3 Encounters:  01/20/23 273 lb (123.8 kg)  10/22/22 273 lb 3.2 oz (123.9 kg)  08/29/22 267 lb (121.1 kg)    Physical Exam  Note examination was completely remotely via video observation objective data only  Gen - well-appearing, no acute distress or apparent pain, comfortable HEENT - eyes appear clear without discharge or redness Heart/Lungs - cannot examine virtually - observed no evidence of coughing or labored breathing. Abd - cannot examine virtually  Skin - face visible today- no rash Neuro - awake, alert, oriented Psych - not anxious appearing   Results for orders placed or performed in visit on 08/22/22  Vitamin B12  Result Value Ref Range   Vitamin B-12 256 200 - 1,100 pg/mL  VITAMIN D 25 Hydroxy (Vit-D Deficiency, Fractures)  Result Value Ref Range   Vit D, 25-Hydroxy 27 (L) 30 - 100 ng/mL  QuantiFERON-TB Gold Plus  Result Value Ref Range   QuantiFERON-TB Gold Plus NEGATIVE NEGATIVE   NIL 0.02 IU/mL   Mitogen-NIL 8.31 IU/mL   TB1-NIL 0.00 IU/mL   TB2-NIL 0.01 IU/mL  T4, free  Result Value Ref Range   Free T4 1.1 0.8 - 1.8 ng/dL  TSH  Result Value Ref Range   TSH 2.77 mIU/L  Hemoglobin A1c  Result Value Ref Range   Hgb A1c MFr Bld 5.7 (H) <5.7 % of total Hgb   Mean Plasma Glucose 117 mg/dL   eAG (mmol/L) 6.5 mmol/L  Lipid panel  Result Value Ref  Range   Cholesterol 132 <200 mg/dL   HDL 46 (L) > OR = 50 mg/dL   Triglycerides 80 <150 mg/dL   LDL Cholesterol (Calc) 70 mg/dL (calc)   Total CHOL/HDL Ratio 2.9 <5.0 (calc)   Non-HDL Cholesterol (Calc) 86 <130 mg/dL (calc)  CBC with Differential/Platelet  Result Value Ref Range   WBC 7.1 3.8 - 10.8 Thousand/uL   RBC 3.99 3.80 - 5.10 Million/uL   Hemoglobin 11.3 (L) 11.7 - 15.5 g/dL   HCT 33.4 (L) 35.0 - 45.0 %   MCV 83.7 80.0 - 100.0 fL   MCH 28.3 27.0 - 33.0 pg   MCHC 33.8 32.0 - 36.0 g/dL   RDW 13.3 11.0 - 15.0 %   Platelets 407 (H) 140 - 400 Thousand/uL   MPV 9.4 7.5 - 12.5 fL   Neutro Abs 4,445 1,500 - 7,800 cells/uL   Lymphs Abs 2,073 850 - 3,900 cells/uL   Absolute Monocytes 412 200 - 950 cells/uL   Eosinophils Absolute 142 15 - 500 cells/uL   Basophils Absolute 28 0 - 200 cells/uL   Neutrophils Relative % 62.6 %   Total Lymphocyte 29.2 %   Monocytes Relative 5.8 %   Eosinophils Relative 2.0 %   Basophils Relative 0.4 %  COMPLETE METABOLIC PANEL WITH GFR  Result Value Ref Range   Glucose, Bld 122 (H) 65 - 99 mg/dL   BUN 8 7 - 25 mg/dL   Creat 0.52 0.50 - 0.99 mg/dL   eGFR 120 > OR = 60 mL/min/1.50m2   BUN/Creatinine Ratio SEE NOTE: 6 - 22 (calc)   Sodium 140 135 - 146 mmol/L   Potassium 3.9 3.5 - 5.3 mmol/L   Chloride 106 98 - 110 mmol/L   CO2 26 20 - 32 mmol/L   Calcium 8.8 8.6 - 10.2 mg/dL   Total Protein 7.0 6.1 - 8.1 g/dL   Albumin 3.6 3.6 - 5.1 g/dL   Globulin 3.4 1.9 - 3.7 g/dL (calc)   AG Ratio 1.1 1.0 - 2.5 (calc)   Total Bilirubin 0.4 0.2 - 1.2 mg/dL   Alkaline phosphatase (APISO) 64 31 - 125 U/L   AST 12 10 - 30 U/L   ALT 14 6 - 29 U/L      Assessment & Plan:   Problem List Items Addressed This Visit   None Visit Diagnoses     COVID-19    -  Primary   Relevant Medications   nirmatrelvir/ritonavir (PAXLOVID) 20 x 150 MG & 10 x 100MG  TABS   benzonatate (TESSALON) 100 MG capsule        COVID19 positive Symptom 1st onset 1 day ago  01/19/23 Confirm  home test positive today 01/20/23 Mild to moderate symptoms currently. No red flags or dyspnea With history of sinusitis / allergies usually   Start Paxlovid, GFR >60, 5 day course as prescribed, regular dosing with normal kidney function GFR >60. Reviewed med interaction checker without interactions Start Tessalon Perls take 1 capsule up to 3 times a day as needed for cough Counseling provided Supportive care OTC PRN Follow-up criteria given.   Meds ordered this encounter  Medications   nirmatrelvir/ritonavir (PAXLOVID) 20 x 150 MG & 10 x 100MG  TABS    Sig: Take 3 tablets by mouth 2 (two) times daily for 5 days. (Take nirmatrelvir 150 mg two tablets twice daily for 5 days and ritonavir 100 mg one tablet twice daily for 5 days) Patient GFR is >60    Dispense:  30 tablet    Refill:  0   benzonatate (TESSALON) 100 MG capsule    Sig: Take 1 capsule (100 mg total) by mouth 3 (three) times daily as needed for cough.    Dispense:  30 capsule    Refill:  0      Follow up plan: Return if symptoms worsen or fail to improve.  Patient verbalizes understanding with the above medical recommendations including the limitation of remote medical advice.  Specific follow-up and call-back criteria were given for patient to follow-up or seek medical care more urgently if needed.  Total duration of direct patient care provided via video conference: 10 minutes   Nobie Putnam, [facility] 01/20/2023, 11:58 AM

## 2023-01-20 NOTE — Patient Instructions (Addendum)
COVID-19 COVID-19 is an infection caused by a virus called SARS-CoV-2. Most people who get COVID-19 have mild to moderate symptoms. Some have little to no symptoms. In others, the virus may cause a severe infection. What are the causes? COVID-19 is caused by a coronavirus. The virus may be in the air as droplets or as tiny specks of fluid (aerosols). It may also be on surfaces. You may catch the virus if you: Breathe in droplets when a person with COVID-19 breathes, speaks, sings, coughs, or sneezes. Touch something that has the virus on it and then touch your mouth, nose, or eyes. What increases the risk? Risk for infection: You are more likely to get COVID-19 if: You are within 6 ft (1.8 m) of a person who has COVID-19 for 15 minutes or longer. You provide care to a person who has COVID-19. You are in close contact with others. This includes hugging, kissing, or sharing utensils. Risk for serious illness caused by COVID-19: You are more likely to get very ill from COVID-19 if: You have cancer. You have a long-term (chronic) disease. This may be: A chronic lung disease, such as pulmonary embolism, chronic obstructive pulmonary disease (COPD), or cystic fibrosis. A disease that affects your body's defense system (immune system). If you have a weak immune system, you are said to be immunocompromised. A serious heart condition, such as heart failure, coronary artery disease, or cardiomyopathy. Diabetes. Chronic kidney disease. A liver disease, such as cirrhosis, nonalcoholic fatty liver disease, alcoholic liver disease, or autoimmune hepatitis. You are obese. You are pregnant or were just pregnant. You have sickle cell disease. What are the signs or symptoms? Symptoms of COVID-19 can range from mild to severe. They may appear any time from 2 to 14 days after you are exposed. They include: Fever or chills. Shortness of breath or trouble breathing. Feeling tired. Headaches, body aches, or  muscle aches. A runny or stuffy nose. Sneezing, coughing, or a sore throat. New loss of taste or smell. You may also have stomach problems, such as nausea, vomiting, or diarrhea. In some cases, you may not have any symptoms. How is this diagnosed? COVID-19 may be diagnosed by testing a sample to check for the virus. The most common tests are the PCR test and the antigen test. Tests may be done in the lab or at home. They include: Using a swab to take a sample of fluid from your nose. Testing a sample of saliva from your mouth. Testing a sample of mucus from your lungs (sputum). How is this treated? Treatment for COVID-19 depends on how severe your condition is. Mild symptoms can be treated at home. You should rest, drink fluids, and take over-the-counter medicine. If you have symptoms and risk factors, you may be prescribed a medicine that fights viruses (antiviral). Severe symptoms may be treated in a hospital intensive care unit (ICU). Treatment may include: Extra oxygen given through a tube in the nose, a face mask, or a hood. Medicines. These may include: Antivirals, such as remdesivir. Anti-inflammatories, such as corticosteroids. These help reduce inflammation. Antithrombotics. These help prevent or treat blood clots. Convalescent plasma. This helps boost your immune system. Prone positioning. This is when you are laid on your stomach to help oxygen get into your lungs. Infection control measures. If you are at risk for a more serious illness, your health care provider may prescribe two medicines to help your immune system protect you. These are called long-acting monoclonal antibodies. They are given together  every 6 months. How is this prevented? To protect yourself: Get the vaccine or vaccine series if you meet the guidelines. You can even get the vaccine while you are pregnant or making breast milk (lactating). Get an added dose of the vaccine if you are immunocompromised. This  applies if you have had an organ transplant or if you have a condition that affects your immune system. You should get the added dose 4 weeks after you got the first one. If you get an mRNA vaccine, you will need to get 3 doses. Talk to your provider about getting experimental monoclonal antibodies. This treatment can help prevent severe illness. It may be given to you if: You are immunocompromised. You cannot get the vaccine. You may not get the vaccine if you have a severe allergic reaction to it or to what it is made of. You are not fully vaccinated. You are in a place where there is COVID-19 and: You are in close contact with someone who has COVID-19. You are at high risk of being exposed. You are at risk of illness from new variants of the virus. To protect others: If you have symptoms of COVID-19, take steps to stop the virus from spreading. Stay home. Leave your house only to get medical care. Do not use public transit. Do not travel while you are sick. Wash your hands often with soap and water for at least 20 seconds. If soap and water are not available, use alcohol-based hand sanitizer. Make sure that all people in your household wash their hands well and often. Cough or sneeze into a tissue or your sleeve or elbow. Do not cough or sneeze into your hand or into the air. Where to find more information Centers for Disease Control and Prevention (CDC): [employer].cy World Health Organization [facility]): http://curry.org/ Get help right away if: You have trouble breathing. You have pain or pressure in your chest. You are confused. Your lips or fingernails turn blue. You have trouble waking from sleep. Your symptoms get worse. These symptoms may be an emergency. Get help right away. Call 911. Do not wait to see if the symptoms will go away. Do not drive yourself to the hospital. This information is not intended to replace advice given to you by your health care provider. Make sure you discuss any  questions you have with your health care provider. Document Revised: 07/04/2022 Document Reviewed: 07/04/2022 Elsevier Patient Education  [place].   Please schedule a Follow-up Appointment to: Return if symptoms worsen or fail to improve.  If you have any other questions or concerns, please feel free to call the office or send a message through [facility]. You may also schedule an earlier appointment if necessary.  Additionally, you may be receiving a survey about your experience at our office within a few days to 1 week by e-mail or mail. We value your feedback.  Nobie Putnam, DO [place]

## 2023-02-02 DIAGNOSIS — M170 Bilateral primary osteoarthritis of knee: Secondary | ICD-10-CM | POA: Diagnosis not present

## 2023-02-10 ENCOUNTER — Other Ambulatory Visit (HOSPITAL_COMMUNITY)
Admission: RE | Admit: 2023-02-10 | Discharge: 2023-02-10 | Disposition: A | Discharge status: Home / Self Care | Payer: 59 | Source: Ambulatory Visit | Attending: Internal Medicine | Admitting: Internal Medicine

## 2023-02-10 ENCOUNTER — Encounter: Payer: Self-pay | Admitting: Internal Medicine

## 2023-02-10 ENCOUNTER — Ambulatory Visit: Payer: 59 | Admitting: Internal Medicine

## 2023-02-10 ENCOUNTER — Ambulatory Visit: Payer: Self-pay

## 2023-02-10 ENCOUNTER — Other Ambulatory Visit: Payer: Self-pay

## 2023-02-10 VITALS — BP 136/88 | HR 88 | Temp 97.1°F | Wt 272.0 lb

## 2023-02-10 DIAGNOSIS — Z113 Encounter for screening for infections with a predominantly sexual mode of transmission: Secondary | ICD-10-CM | POA: Diagnosis not present

## 2023-02-10 DIAGNOSIS — N898 Other specified noninflammatory disorders of vagina: Secondary | ICD-10-CM | POA: Diagnosis not present

## 2023-02-10 DIAGNOSIS — R3915 Urgency of urination: Secondary | ICD-10-CM

## 2023-02-10 DIAGNOSIS — R300 Dysuria: Secondary | ICD-10-CM

## 2023-02-10 LAB — POCT URINALYSIS DIPSTICK
Glucose, UA: NEGATIVE
Ketones, UA: NEGATIVE
Nitrite, UA: NEGATIVE
Protein, UA: POSITIVE — AB
Spec Grav, UA: >=1.03 — AB (ref 1.010–1.025)
Urobilinogen, UA: 0.2 E.U./dL
pH, UA: 6 (ref 5.0–8.0)

## 2023-02-10 MED ORDER — NITROFURANTOIN MONOHYD MACRO 100 MG PO CAPS
100.0000 mg | ORAL_CAPSULE | Freq: Two times a day (BID) | ORAL | 0 refills | Status: DC
Start: 2023-02-10 — End: 2023-02-13
  Filled 2023-02-10: qty 10, 5d supply, fill #0

## 2023-02-10 NOTE — Telephone Encounter (Signed)
  Chief Complaint: Burning with urination, pink urine, possible yeast infection Symptoms: above Frequency: 2 days Pertinent Negatives: Patient denies fever Disposition: [] ED /[] Urgent Care (no appt availability in office) / [x] Appointment(In office/virtual)/ []  [facility]/ [] Home Care/ [] Refused Recommended Disposition /[] [facility]/ []  Follow-up with PCP Additional Notes: Pt started 2 days ago  with burning and itch. Pt thinks this may be a yeast infection from antibiotics taken. Today pt also has pink urine.    Summary: blood in urine/possible UTI and yeast infection   Patient called in stated she may have a yeast infection and UtI taking antibiotic from when she had Covid. The symptoms started 2 days ago and now she noticed the blood in her urine on yesterday.         Reason for Disposition  Pain or burning with passing urine  Answer Assessment - Initial Assessment Questions 1. COLOR of URINE: "Describe the color of the urine."  (e.g., tea-colored, pink, red, bloody) "Do you have blood clots in your urine?" (e.g., none, pea, grape, small coin)     Pale pink 2. ONSET: "When did the bleeding start?"      2 days - yesterday no pink,  3. EPISODES: "How many times has there been blood in the urine?" or "How many times today?"     2 4. PAIN with URINATION: "Is there any pain with passing your urine?" If Yes, ask: "How bad is the pain?"  (Scale 1-10; or mild, moderate, severe)    - MILD: Complains slightly about urination hurting.    - MODERATE: Interferes with normal activities.      - SEVERE: Excruciating, unwilling or unable to urinate because of the pain.      Burning - 6-7/10 5. FEVER: "Do you have a fever?" If Yes, ask: "What is your temperature, how was it measured, and when did it start?"     no 6. ASSOCIATED SYMPTOMS: "Are you passing urine more frequently than usual?"     yes 7. OTHER SYMPTOMS: "Do you have any other symptoms?" (e.g., back/flank pain,  abdomen pain, vomiting)     Abdominal pain - frequent urination  Protocols used: Urine - Blood In-A-AH

## 2023-02-10 NOTE — Patient Instructions (Signed)
Urinary Tract Infection, Adult A urinary tract infection (UTI) is an infection of any part of the urinary tract. The urinary tract includes: The kidneys. The ureters. The bladder. The urethra. These organs make, store, and get rid of pee (urine) in the body. What are the causes? This infection is caused by germs (bacteria) in your genital area. These germs grow and cause swelling (inflammation) of your urinary tract. What increases the risk? The following factors may make you more likely to develop this condition: Using a small, thin tube (catheter) to drain pee. Not being able to control when you pee or poop (incontinence). Being female. If you are female, these things can increase the risk: Using these methods to prevent pregnancy: A medicine that kills sperm (spermicide). A device that blocks sperm (diaphragm). Having low levels of a female hormone (estrogen). Being pregnant. You are more likely to develop this condition if: You have genes that add to your risk. You are sexually active. You take antibiotic medicines. You have trouble peeing because of: A prostate that is bigger than normal, if you are female. A blockage in the part of your body that drains pee from the bladder. A kidney stone. A nerve condition that affects your bladder. Not getting enough to drink. Not peeing often enough. You have other conditions, such as: Diabetes. A weak disease-fighting system (immune system). Sickle cell disease. Gout. Injury of the spine. What are the signs or symptoms? Symptoms of this condition include: Needing to pee right away. Peeing small amounts often. Pain or burning when peeing. Blood in the pee. Pee that smells bad or not like normal. Trouble peeing. Pee that is cloudy. Fluid coming from the vagina, if you are female. Pain in the belly or lower back. Other symptoms include: Vomiting. Not feeling hungry. Feeling mixed up (confused). This may be the first symptom in  older adults. Being tired and grouchy (irritable). A fever. Watery poop (diarrhea). How is this treated? Taking antibiotic medicine. Taking other medicines. Drinking enough water. In some cases, you may need to see a specialist. Follow these instructions at home:  Medicines Take over-the-counter and prescription medicines only as told by your doctor. If you were prescribed an antibiotic medicine, take it as told by your doctor. Do not stop taking it even if you start to feel better. General instructions Make sure you: Pee until your bladder is empty. Do not hold pee for a long time. Empty your bladder after sex. Wipe from front to back after peeing or pooping if you are a female. Use each tissue one time when you wipe. Drink enough fluid to keep your pee pale yellow. Keep all follow-up visits. Contact a doctor if: You do not get better after 1-2 days. Your symptoms go away and then come back. Get help right away if: You have very bad back pain. You have very bad pain in your lower belly. You have a fever. You have chills. You feeling like you will vomit or you vomit. Summary A urinary tract infection (UTI) is an infection of any part of the urinary tract. This condition is caused by germs in your genital area. There are many risk factors for a UTI. Treatment includes antibiotic medicines. Drink enough fluid to keep your pee pale yellow. This information is not intended to replace advice given to you by your health care provider. Make sure you discuss any questions you have with your health care provider. Document Revised: 06/01/2020 Document Reviewed: 06/01/2020 Elsevier Patient Education    2023 Elsevier Inc.  

## 2023-02-10 NOTE — Progress Notes (Signed)
HPI  Pt presents to the clinic today with c/o urinary frequency, dysuria and bladder pressure. She reports this started yesterday. She denies urgency or blood in her urine. She reports vaginal discharge and odor, but denies irritation or itching. She denies fever, chills, nausea or vomiting. She has had a hysterectomy. She has not tried anything OTC for this.   Review of Systems  Past Medical History:  Diagnosis Date   Abnormal Pap smear of anus    HGSIL   Anemia    Back pain    Cervical dysplasia    CIN III   CHF (congestive heart failure) (HCC)    Endometriosis    Gallbladder problem    Headache    Heart murmur    Leg edema    Premature delivery    x2    Family History  Adopted: Yes  Family history unknown: Yes    Social History   Socioeconomic History   Marital status: Divorced    Spouse name: Marcial Pacas   Number of children: 5   Years of education: Not on file   Highest education level: Not on file  Occupational History   Occupation: CNA  Tobacco Use   Smoking status: Never   Smokeless tobacco: Never  Vaping Use   Vaping Use: Never used  Substance and Sexual Activity   Alcohol use: No    Alcohol/week: 0.0 standard drinks of alcohol   Drug use: No   Sexual activity: Yes    Birth control/protection: Surgical    Comment: Tubal ligation  Other Topics Concern   Not on file  Social History Narrative   Not on file   Social Determinants of Health   Financial Resource Strain: Not on file  Food Insecurity: Not on file  Transportation Needs: Not on file  Physical Activity: Inactive (10/29/2017)   Exercise Vital Sign    Days of Exercise per Week: 0 days    Minutes of Exercise per Session: 0 min  Stress: Stress Concern Present (10/29/2017)   [employer] of Occupational Health - Occupational Stress Questionnaire    Feeling of Stress : Very much  Social Connections: Moderately Integrated (10/29/2017)   Social Connection and Isolation Panel [NHANES]     Frequency of Communication with Friends and Family: More than three times a week    Frequency of Social Gatherings with Friends and Family: More than three times a week    Attends Religious Services: More than 4 times per year    Active Member of [employer] or Organizations: No    Attends Banker Meetings: Never    Marital Status: Married  Catering manager Violence: Not At Risk (10/29/2017)   Humiliation, Afraid, Rape, and Kick questionnaire    Fear of Current or Ex-Partner: No    Emotionally Abused: No    Physically Abused: No    Sexually Abused: No    Allergies  Allergen Reactions   Latex Hives   Tramadol     Chest pain     Constitutional: Denies fever, malaise, fatigue, headache or abrupt weight changes.   GU: Pt reports urgency, pain with urination, bladder pressure, vaginal discharge and odor. Denies burning sensation, blood in urine. Skin: Denies redness, rashes, lesions or ulcercations.   No other specific complaints in a complete review of systems (except as listed in HPI above).    Objective:   Physical Exam  BP 136/88 (BP Location: Left Arm, Patient Position: Sitting, Cuff Size: Large)   Pulse 88  Temp (!) 97.1 F (36.2 C) (Temporal)   Wt 272 lb (123.4 kg)   LMP 01/06/2020   SpO2 97%   BMI 53.12 kg/m   Wt Readings from Last 3 Encounters:  01/20/23 273 lb (123.8 kg)  10/22/22 273 lb 3.2 oz (123.9 kg)  08/29/22 267 lb (121.1 kg)    General: Appears her stated age, obese, in NAD. Cardiovascular: Normal rate. Pulmonary/Chest: Normal effort. Abdomen: No CVA tenderness. Pelvic: Self swabbed        Assessment & Plan:   Urgency, Dysuria, Vaginal Discharge, Vaginal Odor:  Urinalysis: 3+ blood, 2+ leuks, negative nitrites Will send wet prep Will check HIV, RPR and Hep C Will send urine culture eRx sent if for Macrobid 100 mg BID x 5 days OK to take AZO OTC Drink plenty of fluids  RTC as needed or if symptoms persist. Nicki Reaper, NP

## 2023-02-11 LAB — HIV ANTIBODY (ROUTINE TESTING W REFLEX): HIV 1&2 Ab, 4th Generation: NONREACTIVE

## 2023-02-11 LAB — HEPATITIS C ANTIBODY: Hepatitis C Ab: NONREACTIVE

## 2023-02-11 LAB — RPR: RPR Ser Ql: NONREACTIVE

## 2023-02-12 ENCOUNTER — Other Ambulatory Visit: Payer: Self-pay

## 2023-02-12 ENCOUNTER — Encounter: Payer: Self-pay | Admitting: Internal Medicine

## 2023-02-12 LAB — CERVICOVAGINAL ANCILLARY ONLY
Bacterial Vaginitis (gardnerella): POSITIVE — AB
Candida Glabrata: NEGATIVE
Candida Vaginitis: NEGATIVE
Chlamydia: NEGATIVE
Comment: NEGATIVE
Comment: NEGATIVE
Comment: NEGATIVE
Comment: NEGATIVE
Comment: NEGATIVE
Comment: NORMAL
Neisseria Gonorrhea: NEGATIVE
Trichomonas: NEGATIVE

## 2023-02-12 MED ORDER — METRONIDAZOLE 500 MG PO TABS
500.0000 mg | ORAL_TABLET | Freq: Two times a day (BID) | ORAL | 0 refills | Status: DC
  Filled 2023-02-12: qty 14, 7d supply, fill #0

## 2023-02-13 ENCOUNTER — Other Ambulatory Visit: Payer: Self-pay

## 2023-02-13 LAB — URINE CULTURE
MICRO NUMBER:: 14802690
SPECIMEN QUALITY:: ADEQUATE

## 2023-02-13 MED ORDER — SULFAMETHOXAZOLE-TRIMETHOPRIM 400-80 MG PO TABS
1.0000 | ORAL_TABLET | Freq: Two times a day (BID) | ORAL | 0 refills | Status: DC
  Filled 2023-02-13: qty 10, 5d supply, fill #0

## 2023-02-13 NOTE — Addendum Note (Signed)
Addended by: Lorre Munroe on: 02/13/2023 09:48 AM   Modules accepted: Orders

## 2023-02-16 ENCOUNTER — Other Ambulatory Visit: Payer: Self-pay

## 2023-02-16 ENCOUNTER — Other Ambulatory Visit: Payer: Self-pay | Admitting: Internal Medicine

## 2023-02-16 MED ORDER — METRONIDAZOLE 500 MG PO TABS
500.0000 mg | ORAL_TABLET | Freq: Two times a day (BID) | ORAL | 0 refills | Status: DC
  Filled 2023-02-16: qty 14, 7d supply, fill #0

## 2023-06-14 ENCOUNTER — Ambulatory Visit
Admission: EM | Admit: 2023-06-14 | Discharge: 2023-06-14 | Disposition: A | Discharge status: Home / Self Care | Payer: 59 | Attending: Emergency Medicine | Admitting: Emergency Medicine

## 2023-06-14 DIAGNOSIS — I10 Essential (primary) hypertension: Secondary | ICD-10-CM | POA: Diagnosis not present

## 2023-06-14 DIAGNOSIS — B370 Candidal stomatitis: Secondary | ICD-10-CM

## 2023-06-14 MED ORDER — NYSTATIN 100000 UNIT/ML MT SUSP: 500000.0000 [IU] | Freq: Four times a day (QID) | OROMUCOSAL | 0 refills | Status: AC

## 2023-06-14 NOTE — Discharge Instructions (Addendum)
Use the Nystatin as directed.  Follow up with your primary care provider.    Your blood pressure is elevated today at 150/91.  Please have this rechecked by your primary care provider in 2-4 weeks.

## 2023-06-14 NOTE — ED Provider Notes (Signed)
Renaldo Fiddler    CSN: [id number] Arrival date & time: 06/14/23  7829      History   Chief Complaint Chief Complaint  Patient presents with   Mouth Lesions    HPI Stacey Taylor is a 42 y.o. female.  Patient presents with tender white plaques on her tongue x 1 month, worse x 2 to 3 days.  She also has noted lesions on the roof of her mouth in the last 1 to 2 days.  She is concerned for thrush.  She was recently on antibiotics for a UTI.  She denies fever, sore throat, difficulty swallowing, cough, or other symptoms.  Her medical history includes hypertension, heart failure, morbid obesity.  The history is provided by the patient and medical records.    Past Medical History:  Diagnosis Date   Abnormal Pap smear of anus    HGSIL   Anemia    Back pain    Cervical dysplasia    CIN III   CHF (congestive heart failure) (HCC)    Endometriosis    Gallbladder problem    Headache    Heart murmur    Leg edema    Premature delivery    x2    Patient Active Problem List   Diagnosis Date Noted   Chronic rhinitis 11/03/2022   Generalized anxiety disorder with panic attacks 11/27/2020   Diastolic dysfunction 11/07/2020   OSA (obstructive sleep apnea) 03/13/2020   Abnormal uterine bleeding (AUB)    S/P hysterectomy 03/08/2020   S/P laparoscopic supracervical hysterectomy 03/08/2020   Lymphedema 08/15/2019   Bilateral primary osteoarthritis of knee 06/17/2019   Dizziness 09/16/2018   Headache disorder 09/16/2018   Chronic migraine 07/15/2018   Onychomycosis 03/11/2018   Vitamin B12 deficiency 01/11/2018   Pre-diabetes 01/11/2018   Other fatigue 12/10/2017   Shortness of breath on exertion 12/10/2017   Edema 12/10/2017   Vitamin D deficiency 12/10/2017   Bilateral lower extremity edema 07/09/2017   Dyspnea on exertion 07/09/2017   Essential hypertension 07/09/2017   Anemia 07/09/2017   Chronic low back pain with left-sided sciatica 09/09/2016   Morbid obesity with  BMI of 50.0-59.9, adult (HCC) 11/23/2015    Past Surgical History:  Procedure Laterality Date   ABDOMINAL HYSTERECTOMY     CERVICAL BIOPSY  W/ LOOP ELECTRODE EXCISION  11/2005   CESAREAN SECTION  2008/2015   CESAREAN SECTION WITH BILATERAL TUBAL LIGATION  2015   CHOLECYSTECTOMY  2003   COLPOSCOPY  2005, 2006   COMBINED HYSTEROSCOPY DIAGNOSTIC / D&C  2014   CYSTOSCOPY N/A 03/08/2020   Procedure: CYSTOSCOPY;  Surgeon: Vena Austria, MD;  Location: [facility];  Service: Gynecology;  Laterality: N/A;   LAPAROSCOPIC SUPRACERVICAL HYSTERECTOMY N/A 03/08/2020   Procedure: LAPAROSCOPIC SUPRACERVICAL HYSTERECTOMY WITH BILATERAL SALPINGECTOMY;  Surgeon: Vena Austria, MD;  Location: [facility];  Service: Gynecology;  Laterality: N/A;    OB History     Gravida  5   Para  5   Term  3   Preterm  2   AB      Living  5      SAB      IAB      Ectopic      Multiple      Live Births  5            Home Medications    Prior to Admission medications   Medication Sig Start Date End Date Taking? Authorizing Provider  nystatin (MYCOSTATIN) 100000 UNIT/ML suspension  Take 5 mLs (500,000 Units total) by mouth 4 (four) times daily for 10 days. 06/14/23 06/24/23 Yes Mickie Bail, NP  azelastine (ASTELIN) 0.1 % nasal spray Place 2 sprays into both nostrils 2 (two) times daily. Use in each nostril as directed Patient not taking: Reported on 01/12/2023 10/22/22   Glenford Bayley, NP  benzonatate (TESSALON) 100 MG capsule Take 1 capsule (100 mg total) by mouth 3 (three) times daily as needed for cough. 01/20/23   Karamalegos, Netta Neat, DO  cyclobenzaprine (FLEXERIL) 5 MG tablet Take 1-2 tablets (5-10 mg total) by mouth 3 (three) times daily as needed for muscle spasms. 08/22/22   Karamalegos, Netta Neat, DO  liothyronine (CYTOMEL) 5 MCG tablet 1 tablet on an empty stomach, after 7 days increase to 2 tablets Once a day 30 day(s) Patient not taking: Reported on 01/12/2023 01/28/22      metroNIDAZOLE (FLAGYL) 500 MG tablet Take 1 tablet (500 mg total) by mouth 2 (two) times daily. Do not drink alcohol while taking this medicine. 02/16/23   Lorre Munroe, NP  sulfamethoxazole-trimethoprim (BACTRIM) 400-80 MG tablet Take 1 tablet by mouth 2 (two) times daily. 02/13/23   Lorre Munroe, NP  loratadine (CLARITIN) 10 MG tablet Take 10 mg by mouth daily.  09/04/20  [provider]    Family History Family History  Adopted: Yes  Family history unknown: Yes    Social History Social History   Tobacco Use   Smoking status: Never   Smokeless tobacco: Never  Vaping Use   Vaping status: Never Used  Substance Use Topics   Alcohol use: No    Alcohol/week: 0.0 standard drinks of alcohol   Drug use: No     Allergies   Latex and Tramadol   Review of Systems Review of Systems  Constitutional:  Negative for chills and fever.  HENT:  Positive for mouth sores. Negative for ear pain, sore throat, trouble swallowing and voice change.   Respiratory:  Negative for cough and shortness of breath.      Physical Exam Triage Vital Signs ED Triage Vitals  Encounter Vitals Group     BP      Systolic BP Percentile      Diastolic BP Percentile      Pulse      Resp      Temp      Temp src      SpO2      Weight      Height      Head Circumference      Peak Flow      Pain Score      Pain Loc      Pain Education      Exclude from Growth Chart    No data found.  Updated Vital Signs BP (!) 150/91 (BP Location: Left Arm)   Pulse 88   Temp 98.1 F (36.7 C)   Resp 18   LMP 01/06/2020   SpO2 96%   Visual Acuity Right Eye Distance:   Left Eye Distance:   Bilateral Distance:    Right Eye Near:   Left Eye Near:    Bilateral Near:     Physical Exam Constitutional:      General: She is not in acute distress. HENT:     Right Ear: Tympanic membrane normal.     Left Ear: Tympanic membrane normal.     Nose: Nose normal.     Mouth/Throat:     Mouth:  Mucous  membranes are moist.     Comments: White plaques on tongue.  Few papules on palate. Cardiovascular:     Rate and Rhythm: Normal rate and regular rhythm.     Heart sounds: Normal heart sounds.  Pulmonary:     Effort: Pulmonary effort is normal. No respiratory distress.     Breath sounds: Normal breath sounds.  Skin:    General: Skin is warm and dry.  Neurological:     Mental Status: She is alert.      UC Treatments / Results  Labs (all labs ordered are listed, but only abnormal results are displayed) Labs Reviewed - No data to display  EKG   Radiology No results found.  Procedures Procedures (including critical care time)  Medications Ordered in UC Medications - No data to display  Initial Impression / Assessment and Plan / UC Course  I have reviewed the triage vital signs and the nursing notes.  Pertinent labs & imaging results that were available during my care of the patient were reviewed by me and considered in my medical decision making (see chart for details).    Oral thrush, elevated blood pressure reading with hypertension.  Treating thrush with nystatin oral suspension.  Education provided on thrush.  Instructed patient to follow-up with her PCP.  Also discussed with patient that her blood pressure is elevated today and needs to be rechecked by PCP in 2 to 4 weeks.  Education provided on managing hypertension.  She agrees to plan of care.   Final Clinical Impressions(s) / UC Diagnoses   Final diagnoses:  Elevated blood pressure reading in office with diagnosis of hypertension  Oral thrush     Discharge Instructions      Use the Nystatin as directed.  Follow up with your primary care provider.    Your blood pressure is elevated today at 150/91.  Please have this rechecked by your primary care provider in 2-4 weeks.          ED Prescriptions     Medication Sig Dispense Auth. Provider   nystatin (MYCOSTATIN) 100000 UNIT/ML suspension Take 5 mLs  (500,000 Units total) by mouth 4 (four) times daily for 10 days. 200 mL Mickie Bail, NP      PDMP not reviewed this encounter.   Mickie Bail, NP 06/14/23 [phone]

## 2023-06-14 NOTE — ED Triage Notes (Signed)
Patient presents to UC for thrush x 1 month. States she has been on several antibiotics and she has noted bumps in her mouth and white patches on her tongue. Concerned with thrush.   Denies fever.

## 2023-07-07 ENCOUNTER — Other Ambulatory Visit: Payer: Self-pay

## 2023-07-07 DIAGNOSIS — L811 Chloasma: Secondary | ICD-10-CM | POA: Diagnosis not present

## 2023-07-07 MED ORDER — TRANEXAMIC ACID 650 MG PO TABS
ORAL_TABLET | ORAL | 2 refills | Status: DC
  Filled 2023-07-07: qty 60, 60d supply, fill #0
  Filled 2023-07-07: qty 30, 30d supply, fill #0

## 2023-08-24 ENCOUNTER — Other Ambulatory Visit: Payer: Self-pay | Admitting: Family Medicine

## 2023-08-24 ENCOUNTER — Other Ambulatory Visit: Payer: Self-pay

## 2023-08-24 ENCOUNTER — Ambulatory Visit: Payer: 59 | Admitting: Family Medicine

## 2023-08-24 VITALS — BP 136/82 | HR 99 | Ht 60.0 in | Wt 275.0 lb

## 2023-08-24 DIAGNOSIS — Z6841 Body Mass Index (BMI) 40.0 and over, adult: Secondary | ICD-10-CM | POA: Diagnosis not present

## 2023-08-24 DIAGNOSIS — R7303 Prediabetes: Secondary | ICD-10-CM

## 2023-08-24 DIAGNOSIS — E538 Deficiency of other specified B group vitamins: Secondary | ICD-10-CM

## 2023-08-24 DIAGNOSIS — E785 Hyperlipidemia, unspecified: Secondary | ICD-10-CM

## 2023-08-24 DIAGNOSIS — Z23 Encounter for immunization: Secondary | ICD-10-CM | POA: Diagnosis not present

## 2023-08-24 DIAGNOSIS — I10 Essential (primary) hypertension: Secondary | ICD-10-CM

## 2023-08-24 DIAGNOSIS — R300 Dysuria: Secondary | ICD-10-CM

## 2023-08-24 DIAGNOSIS — E559 Vitamin D deficiency, unspecified: Secondary | ICD-10-CM

## 2023-08-24 DIAGNOSIS — R8290 Unspecified abnormal findings in urine: Secondary | ICD-10-CM

## 2023-08-24 MED ORDER — VALSARTAN 40 MG PO TABS
40.0000 mg | ORAL_TABLET | Freq: Every day | ORAL | 0 refills | Status: DC
  Filled 2023-08-24: qty 30, 30d supply, fill #0

## 2023-08-24 NOTE — Progress Notes (Signed)
Subjective:    Patient ID: Stacey Taylor, female    DOB: 24-Dec-1980, 42 y.o.   MRN: [id number]  Stacey Taylor is a 42 y.o. female presenting on 08/24/2023 for Hypertension and Weight Management Screening   HPI  Discussed the use of AI scribe software for clinical note transcription with the patient, who gave verbal consent to proceed.     Hypertension Elevated BP Previously managed BP off of medication. On hydrochlorothiazide years prior 2018 but did not tolerate well. She is very sensitive to medications  The patient, with a history of hypertension, presents with concerns about elevated blood pressure readings at home, with the most recent reading being 140 over 100. She attributes the elevated readings to increased anxiety and stress, particularly related to issues with her son. She has been attempting to manage her blood pressure naturally, but is considering the need for medication due to the recent readings. She also mentions a daily coffee habit, which she believes may be contributing to her hypertension.  In addition to hypertension, the patient is also dealing with weight issues. She expresses interest in weight loss medications, specifically mentioning Wegovy and Zepbound, but is concerned about insurance coverage. She has recently switched insurance plans and is unsure about the coverage for these medications. She expresses a desire to start focusing on weight loss once her son's issues are resolved.  The patient also reports frequent dizziness, even when simply rolling from side to side in bed. She has a history of being prescribed hydrochlorothiazide, a diuretic, but stopped taking it due to the side effect of dizziness. She expresses hesitancy about starting new medications due to potential side effects.  Lastly, the patient mentions concerns about cloudy urine and requests a urine test. Previously 02/2023 had a previous urine culture that showed resistance to antibiotics,  leading to changes in her prescribed medication, initially on Macrobid then changed to Bactrim.  She expresses a desire to have her blood work done as well, as it has been a year since her last labs. She also mentions a previous interest in weight loss surgery, which she plans to discuss with her cardiologist.            08/24/2023    3:56 PM 03/14/2022    9:05 AM 05/29/2021    1:42 PM  Depression screen PHQ 2/9  Decreased Interest 0 1 0  Down, Depressed, Hopeless 0 0 0  PHQ - 2 Score 0 1 0  Altered sleeping 0 1 0  Tired, decreased energy 1 1 1   Change in appetite 0 0 1  Feeling bad or failure about yourself  0 0 0  Trouble concentrating 0 0 0  Moving slowly or fidgety/restless 0 0 0  Suicidal thoughts 0 0 0  PHQ-9 Score 1 3 2   Difficult doing work/chores Not difficult at all Not difficult at all Not difficult at all    Social History   Tobacco Use   Smoking status: Never   Smokeless tobacco: Never  Vaping Use   Vaping status: Never Used  Substance Use Topics   Alcohol use: No    Alcohol/week: 0.0 standard drinks of alcohol   Drug use: No    Review of Systems Per HPI unless specifically indicated above     Objective:    BP 136/82   Pulse 99   Ht 5' (1.524 m)   Wt 275 lb (124.7 kg)   LMP 01/06/2020   SpO2 97%   BMI 53.71 kg/m  Wt Readings from Last 3 Encounters:  08/24/23 275 lb (124.7 kg)  02/10/23 272 lb (123.4 kg)  01/20/23 273 lb (123.8 kg)    Physical Exam Vitals and nursing note reviewed.  Constitutional:      General: She is not in acute distress.    Appearance: She is well-developed. She is obese. She is not diaphoretic.     Comments: Well-appearing, comfortable, cooperative  HENT:     Head: Normocephalic and atraumatic.  Eyes:     General:        Right eye: No discharge.        Left eye: No discharge.     Conjunctiva/sclera: Conjunctivae normal.  Neck:     Thyroid: No thyromegaly.  Cardiovascular:     Rate and Rhythm: Normal rate and  regular rhythm.     Heart sounds: Normal heart sounds. No murmur heard. Pulmonary:     Effort: Pulmonary effort is normal. No respiratory distress.     Breath sounds: Normal breath sounds. No wheezing or rales.  Musculoskeletal:        General: Normal range of motion.     Cervical back: Normal range of motion and neck supple.     Right lower leg: Edema present.     Left lower leg: Edema present.  Lymphadenopathy:     Cervical: No cervical adenopathy.  Skin:    General: Skin is warm and dry.     Findings: No erythema or rash.  Neurological:     Mental Status: She is alert and oriented to person, place, and time.  Psychiatric:        Behavior: Behavior normal.     Comments: Well groomed, good eye contact, normal speech and thoughts      Results for orders placed or performed in visit on 02/10/23  Urine Culture   Specimen: Urine  Result Value Ref Range   MICRO NUMBER: [id number]    SPECIMEN QUALITY: Adequate    Sample Source URINE    STATUS: FINAL    ISOLATE 1: Proteus mirabilis (A)       Susceptibility   Proteus mirabilis - URINE CULTURE, REFLEX    AMOX/CLAVULANIC 4 Sensitive     AMPICILLIN >=32 Resistant     AMPICILLIN/SULBACTAM 16 Intermediate     CEFAZOLIN* <=4 Not Reportable      * For infections other than uncomplicated UTI caused by E. coli, K. pneumoniae or P. mirabilis: Cefazolin is resistant if MIC > or = 8 mcg/mL. (Distinguishing susceptible versus intermediate for isolates with MIC < or = 4 mcg/mL requires additional testing.) For uncomplicated UTI caused by E. coli, K. pneumoniae or P. mirabilis: Cefazolin is susceptible if MIC <32 mcg/mL and predicts susceptible to the oral agents cefaclor, cefdinir, cefpodoxime, cefprozil, cefuroxime, cephalexin and loracarbef.     CEFTAZIDIME <=1 Sensitive     CEFEPIME <=1 Sensitive     CEFTRIAXONE <=1 Sensitive     CIPROFLOXACIN <=0.25 Sensitive     LEVOFLOXACIN <=0.12 Sensitive     GENTAMICIN <=1 Sensitive      NITROFURANTOIN 128 Resistant     PIP/TAZO <=4 Sensitive     TOBRAMYCIN <=1 Sensitive     TRIMETH/SULFA* <=20 Sensitive      * For infections other than uncomplicated UTI caused by E. coli, K. pneumoniae or P. mirabilis: Cefazolin is resistant if MIC > or = 8 mcg/mL. (Distinguishing susceptible versus intermediate for isolates with MIC < or = 4 mcg/mL requires additional testing.) For uncomplicated UTI caused  by E. coli, K. pneumoniae or P. mirabilis: Cefazolin is susceptible if MIC <32 mcg/mL and predicts susceptible to the oral agents cefaclor, cefdinir, cefpodoxime, cefprozil, cefuroxime, cephalexin and loracarbef. Legend: S = Susceptible  I = Intermediate R = Resistant  NS = Not susceptible * = Not tested  NR = Not reported **NN = See antimicrobic comments   HIV Antibody (routine testing w rflx)  Result Value Ref Range   HIV 1&2 Ab, 4th Generation NON-REACTIVE NON-REACTIVE  RPR  Result Value Ref Range   RPR Ser Ql NON-REACTIVE NON-REACTIVE  Hepatitis C antibody  Result Value Ref Range   Hepatitis C Ab NON-REACTIVE NON-REACTIVE  POCT Urinalysis Dipstick  Result Value Ref Range   Color, UA pink    Clarity, UA cloudy    Glucose, UA Negative Negative   Bilirubin, UA Small    Ketones, UA Negative    Spec Grav, UA >=1.030 (A) 1.010 - 1.025   Blood, UA Large    pH, UA 6.0 5.0 - 8.0   Protein, UA Positive (A) Negative   Urobilinogen, UA 0.2 0.2 or 1.0 E.U./dL   Nitrite, UA Negative    Leukocytes, UA Moderate (2+) (A) Negative   Appearance     Odor foul   Cervicovaginal ancillary only  Result Value Ref Range   Neisseria Gonorrhea Negative    Chlamydia Negative    Trichomonas Negative    Bacterial Vaginitis (gardnerella) Positive (A)    Candida Vaginitis Negative    Candida Glabrata Negative    Comment      Normal Reference Range Bacterial Vaginosis - Negative   Comment Normal Reference Range Candida Species - Negative    Comment Normal Reference Range Candida  Galbrata - Negative    Comment Normal Reference Range Trichomonas - Negative    Comment Normal Reference Ranger Chlamydia - Negative    Comment      Normal Reference Range Neisseria Gonorrhea - Negative      Assessment & Plan:   Problem List Items Addressed This Visit     Essential hypertension - Primary   Relevant Medications   valsartan (DIOVAN) 40 MG tablet   Morbid obesity with BMI of 50.0-59.9, adult (HCC)   Other Visit Diagnoses     Need for influenza vaccination       Relevant Orders   Flu vaccine trivalent PF, 6mos and older(Flulaval,Afluria,Fluarix,Fluzone) (Completed)       Assessment and Plan    Hypertension Elevated blood pressure readings at home, likely influenced by stress and anxiety. No current antihypertensive medication.  Prior failed hydrochlorothiazide  Patient has a scheduled appointment with a cardiologist. -Prescribe Valsartan 40mg  daily, with lowest dose start -Advise patient to discuss the new medication with her cardiologist.  Morbid Obesity BMI >50 Patient expressed interest in weight loss medications and potential weight loss surgery. Previous insurance did not cover weight loss medications. - Unsuccessful lifestyle intervention physician guided diet and exercise regimen >3 months -Provide a list of potential weight loss medications for patient to check coverage with her new insurance. -Advise patient to discuss weight loss surgery options - reconsider [facility]  Dysuria Cloudy Urine Patient reports cloudy urine, previous urine culture showed resistance to antibiotics. -Order urine culture to be done on 08/25/2023. -Order comprehensive blood panel including B12, glucose, and thyroid levels to be done on 08/25/2023.  General Health Maintenance -Advise patient to communicate via MyChart once she has checked coverage for weight loss medications and decided on a preference.  Orders Placed This Encounter  Procedures   Flu vaccine  trivalent PF, 6mos and older(Flulaval,Afluria,Fluarix,Fluzone)      Meds ordered this encounter  Medications   valsartan (DIOVAN) 40 MG tablet    Sig: Take 1 tablet (40 mg total) by mouth daily.    Dispense:  30 tablet    Refill:  0      Follow up plan: Return if symptoms worsen or fail to improve.  Labs urine tomorrow Urine culture D, B12, A1c, CMET CBC, Lipid, Thyroid   Saralyn Pilar, DO [facility] 08/24/2023, 4:08 PM

## 2023-08-24 NOTE — Patient Instructions (Addendum)
Thank you for coming to the office today.  Elevated BP readings.  Valsartan 40mg  daily- consider this med for blood pressure, check with heart doctors as well.   For Weight Loss / Obesity only  Contrave - oral medication, appetite suppression has wellbutrin/bupropion and naltrexone in it and it can also help with appetite, it is ordered through a speciality pharmacy. - $99 per month  Free sample 7 day, 1 pill per day for 1 week   -------------------  Reginal Lutes (weekly)  Zepbound (weekly)  Check cost coverage, let me know preference if something is covered or potentially covered with [place] then we can proceed.  -------------------  Semaglutide injection (mixed Ozempic) from [facility] 0.25mg  weekly for 4 weeks then increase to 0.5mg  weekly It comes in a vial and a needle syringe, you need to draw up the shot and self admin it weekly Cost is about $200-300 per month Call them to check pricing and availability  [facility] Address: [address] [place], [place] [address] Phone: [phone]  --------------------------------  [facility] in [place], does not accept insurance. Call to schedule / Walk in to schedule They can do the weekly weight loss injections (same as Ozempic) Pay per visit and per shot. It may be approximately $60-75+ per visit/shot, approximately $200-300 range per month depending  [facility] Address: [address] [place], [place] [address] Phone: [phone]   Please schedule a Follow-up Appointment to: Return if symptoms worsen or fail to improve.  If you have any other questions or concerns, please feel free to call the office or send a message through MyChart. You may also schedule an earlier appointment if necessary.  Additionally, you may be receiving a survey about your experience at our office within a few days to 1 week by e-mail or mail. We value your feedback.  Saralyn Pilar, DO [facility], [place]

## 2023-08-25 ENCOUNTER — Other Ambulatory Visit: Payer: Self-pay

## 2023-08-25 ENCOUNTER — Other Ambulatory Visit: Payer: 59

## 2023-08-25 DIAGNOSIS — E559 Vitamin D deficiency, unspecified: Secondary | ICD-10-CM

## 2023-08-25 DIAGNOSIS — I10 Essential (primary) hypertension: Secondary | ICD-10-CM

## 2023-08-25 DIAGNOSIS — E538 Deficiency of other specified B group vitamins: Secondary | ICD-10-CM

## 2023-08-25 DIAGNOSIS — R7303 Prediabetes: Secondary | ICD-10-CM

## 2023-08-25 DIAGNOSIS — R8290 Unspecified abnormal findings in urine: Secondary | ICD-10-CM

## 2023-08-25 DIAGNOSIS — R300 Dysuria: Secondary | ICD-10-CM

## 2023-08-25 DIAGNOSIS — E785 Hyperlipidemia, unspecified: Secondary | ICD-10-CM

## 2023-08-26 ENCOUNTER — Ambulatory Visit
Admission: EM | Admit: 2023-08-26 | Discharge: 2023-08-26 | Disposition: A | Discharge status: Home / Self Care | Payer: 59 | Attending: Emergency Medicine | Admitting: Emergency Medicine

## 2023-08-26 ENCOUNTER — Other Ambulatory Visit: Payer: 59

## 2023-08-26 DIAGNOSIS — Z6841 Body Mass Index (BMI) 40.0 and over, adult: Secondary | ICD-10-CM | POA: Diagnosis not present

## 2023-08-26 DIAGNOSIS — R8290 Unspecified abnormal findings in urine: Secondary | ICD-10-CM | POA: Diagnosis not present

## 2023-08-26 DIAGNOSIS — E538 Deficiency of other specified B group vitamins: Secondary | ICD-10-CM | POA: Diagnosis not present

## 2023-08-26 DIAGNOSIS — E785 Hyperlipidemia, unspecified: Secondary | ICD-10-CM | POA: Diagnosis not present

## 2023-08-26 DIAGNOSIS — B349 Viral infection, unspecified: Secondary | ICD-10-CM

## 2023-08-26 DIAGNOSIS — R7303 Prediabetes: Secondary | ICD-10-CM | POA: Diagnosis not present

## 2023-08-26 DIAGNOSIS — I10 Essential (primary) hypertension: Secondary | ICD-10-CM | POA: Diagnosis not present

## 2023-08-26 DIAGNOSIS — R300 Dysuria: Secondary | ICD-10-CM | POA: Diagnosis not present

## 2023-08-26 DIAGNOSIS — E559 Vitamin D deficiency, unspecified: Secondary | ICD-10-CM | POA: Diagnosis not present

## 2023-08-26 LAB — POCT RAPID STREP A (OFFICE): Rapid Strep A Screen: NEGATIVE

## 2023-08-26 NOTE — ED Triage Notes (Signed)
Patient to Urgent Care with complaints of sore throat/ lightheadedness/ nausea/ body aches/ feeling "clammy".   Symptoms started last night. Received her flu shot on Monday.

## 2023-08-26 NOTE — Discharge Instructions (Addendum)
The strep test is negative.      Take Tylenol as needed for fever or discomfort.  Rest and keep yourself hydrated.    Follow-up with your primary care provider if your symptoms are not improving.     

## 2023-08-26 NOTE — ED Provider Notes (Signed)
Renaldo Fiddler    CSN: [id number] Arrival date & time: 08/26/23  1720      History   Chief Complaint Chief Complaint  Patient presents with   Nausea   Sore Throat    HPI Stacey Taylor is a 42 y.o. female.  Patient presents with body aches, headache, sore throat, lightheadedness, nausea since last night.  Treating symptoms with Tylenol and Excedrin.  No fever, cough, shortness of breath, chest pain, or other symptoms.  She had her flu shot 2 days ago.  Her medical history includes hypertension, heart failure, chronic migraine, orbit obesity, lymphedema.  The history is provided by the patient and medical records.    Past Medical History:  Diagnosis Date   Abnormal Pap smear of anus    HGSIL   Anemia    Back pain    Cervical dysplasia    CIN III   CHF (congestive heart failure) (HCC)    Endometriosis    Gallbladder problem    Headache    Heart murmur    Leg edema    Premature delivery    x2    Patient Active Problem List   Diagnosis Date Noted   Chronic rhinitis 11/03/2022   Generalized anxiety disorder with panic attacks 11/27/2020   Diastolic dysfunction 11/07/2020   OSA (obstructive sleep apnea) 03/13/2020   Abnormal uterine bleeding (AUB)    S/P hysterectomy 03/08/2020   S/P laparoscopic supracervical hysterectomy 03/08/2020   Lymphedema 08/15/2019   Bilateral primary osteoarthritis of knee 06/17/2019   Dizziness 09/16/2018   Headache disorder 09/16/2018   Chronic migraine 07/15/2018   Onychomycosis 03/11/2018   Vitamin B12 deficiency 01/11/2018   Pre-diabetes 01/11/2018   Other fatigue 12/10/2017   Shortness of breath on exertion 12/10/2017   Edema 12/10/2017   Vitamin D deficiency 12/10/2017   Bilateral lower extremity edema 07/09/2017   Dyspnea on exertion 07/09/2017   Essential hypertension 07/09/2017   Anemia 07/09/2017   Chronic low back pain with left-sided sciatica 09/09/2016   Morbid obesity with BMI of 50.0-59.9, adult (HCC)  11/23/2015    Past Surgical History:  Procedure Laterality Date   ABDOMINAL HYSTERECTOMY     CERVICAL BIOPSY  W/ LOOP ELECTRODE EXCISION  11/2005   CESAREAN SECTION  2008/2015   CESAREAN SECTION WITH BILATERAL TUBAL LIGATION  2015   CHOLECYSTECTOMY  2003   COLPOSCOPY  2005, 2006   COMBINED HYSTEROSCOPY DIAGNOSTIC / D&C  2014   CYSTOSCOPY N/A 03/08/2020   Procedure: CYSTOSCOPY;  Surgeon: Vena Austria, MD;  Location: [facility];  Service: Gynecology;  Laterality: N/A;   LAPAROSCOPIC SUPRACERVICAL HYSTERECTOMY N/A 03/08/2020   Procedure: LAPAROSCOPIC SUPRACERVICAL HYSTERECTOMY WITH BILATERAL SALPINGECTOMY;  Surgeon: Vena Austria, MD;  Location: [facility];  Service: Gynecology;  Laterality: N/A;    OB History     Gravida  5   Para  5   Term  3   Preterm  2   AB      Living  5      SAB      IAB      Ectopic      Multiple      Live Births  5            Home Medications    Prior to Admission medications   Medication Sig Start Date End Date Taking? Authorizing Provider  cyclobenzaprine (FLEXERIL) 5 MG tablet Take 1-2 tablets (5-10 mg total) by mouth 3 (three) times daily as needed for muscle spasms.  08/22/22   Karamalegos, Netta Neat, DO  valsartan (DIOVAN) 40 MG tablet Take 1 tablet (40 mg total) by mouth daily. 08/24/23   Karamalegos, Netta Neat, DO  loratadine (CLARITIN) 10 MG tablet Take 10 mg by mouth daily.  09/04/20  [provider]    Family History Family History  Adopted: Yes  Family history unknown: Yes    Social History Social History   Tobacco Use   Smoking status: Never   Smokeless tobacco: Never  Vaping Use   Vaping status: Never Used  Substance Use Topics   Alcohol use: No    Alcohol/week: 0.0 standard drinks of alcohol   Drug use: No     Allergies   Latex and Tramadol   Review of Systems Review of Systems  Constitutional:  Negative for chills and fever.  HENT:  Positive for sore throat. Negative for ear  pain.   Respiratory:  Negative for cough and shortness of breath.   Cardiovascular:  Negative for chest pain and palpitations.  Gastrointestinal:  Positive for nausea. Negative for diarrhea and vomiting.  Neurological:  Positive for light-headedness and headaches.     Physical Exam Triage Vital Signs ED Triage Vitals  Encounter Vitals Group     BP 08/26/23 1737 136/87     Systolic BP Percentile --      Diastolic BP Percentile --      Pulse Rate 08/26/23 1729 (!) 103     Resp 08/26/23 1729 18     Temp 08/26/23 1729 98.2 F (36.8 C)     Temp src --      SpO2 08/26/23 1729 97 %     Weight --      Height --      Head Circumference --      Peak Flow --      Pain Score 08/26/23 1728 6     Pain Loc --      Pain Education --      Exclude from Growth Chart --    No data found.  Updated Vital Signs BP 136/87   Pulse (!) 103   Temp 98.2 F (36.8 C)   Resp 18   LMP 01/06/2020   SpO2 97%   Visual Acuity Right Eye Distance:   Left Eye Distance:   Bilateral Distance:    Right Eye Near:   Left Eye Near:    Bilateral Near:     Physical Exam Constitutional:      General: She is not in acute distress. HENT:     Right Ear: Tympanic membrane normal.     Left Ear: Tympanic membrane normal.     Nose: Nose normal.     Mouth/Throat:     Mouth: Mucous membranes are moist.     Pharynx: Posterior oropharyngeal erythema present.  Cardiovascular:     Rate and Rhythm: Normal rate and regular rhythm.     Heart sounds: Normal heart sounds.  Pulmonary:     Effort: Pulmonary effort is normal. No respiratory distress.     Breath sounds: Normal breath sounds.  Skin:    General: Skin is warm and dry.  Neurological:     Mental Status: She is alert.      UC Treatments / Results  Labs (all labs ordered are listed, but only abnormal results are displayed) Labs Reviewed  POCT RAPID STREP A (OFFICE)    EKG   Radiology No results found.  Procedures Procedures (including  critical care time)  Medications Ordered  in UC Medications - No data to display  Initial Impression / Assessment and Plan / UC Course  I have reviewed the triage vital signs and the nursing notes.  Pertinent labs & imaging results that were available during my care of the patient were reviewed by me and considered in my medical decision making (see chart for details).   Viral illness.  Rapid strep negative.  Discussed symptomatic treatment including Tylenol, rest, hydration.  Instructed patient to follow up with PCP if symptoms are not improving.  She agrees to plan of care.    Final Clinical Impressions(s) / UC Diagnoses   Final diagnoses:  Viral illness     Discharge Instructions      The strep test is negative.      Take Tylenol as needed for fever or discomfort.  Rest and keep yourself hydrated.    Follow-up with your primary care provider if your symptoms are not improving.         ED Prescriptions   None    PDMP not reviewed this encounter.   Mickie Bail, NP 08/26/23 [phone]

## 2023-08-31 LAB — COMPLETE METABOLIC PANEL WITH GFR
AG Ratio: 1.1 (calc) (ref 1.0–2.5)
ALT: 16 U/L (ref 6–29)
AST: 12 U/L (ref 10–30)
Albumin: 3.7 g/dL (ref 3.6–5.1)
Alkaline phosphatase (APISO): 70 U/L (ref 31–125)
BUN/Creatinine Ratio: 11 (calc) (ref 6–22)
BUN: 6 mg/dL — ABNORMAL LOW (ref 7–25)
CO2: 24 mmol/L (ref 20–32)
Calcium: 8.7 mg/dL (ref 8.6–10.2)
Chloride: 104 mmol/L (ref 98–110)
Creat: 0.53 mg/dL (ref 0.50–0.99)
Globulin: 3.4 g/dL (ref 1.9–3.7)
Glucose, Bld: 90 mg/dL (ref 65–99)
Potassium: 3.7 mmol/L (ref 3.5–5.3)
Sodium: 138 mmol/L (ref 135–146)
Total Bilirubin: 0.5 mg/dL (ref 0.2–1.2)
Total Protein: 7.1 g/dL (ref 6.1–8.1)
eGFR: 119 mL/min/{1.73_m2} (ref >=60)

## 2023-08-31 LAB — LIPID PANEL
Cholesterol: 155 mg/dL (ref <200)
HDL: 57 mg/dL (ref >=50)
LDL Cholesterol (Calc): 86 mg/dL
Non-HDL Cholesterol (Calc): 98 mg/dL (ref <130)
Total CHOL/HDL Ratio: 2.7 (calc) (ref <5.0)
Triglycerides: 50 mg/dL (ref <150)

## 2023-08-31 LAB — CBC WITH DIFFERENTIAL/PLATELET
Absolute Lymphocytes: 1538 {cells}/uL (ref 850–3900)
Absolute Monocytes: 554 {cells}/uL (ref 200–950)
Basophils Absolute: 37 {cells}/uL (ref 0–200)
Basophils Relative: 0.3 %
Eosinophils Absolute: 98 {cells}/uL (ref 15–500)
Eosinophils Relative: 0.8 %
HCT: 38 % (ref 35.0–45.0)
Hemoglobin: 12.2 g/dL (ref 11.7–15.5)
MCH: 28.4 pg (ref 27.0–33.0)
MCHC: 32.1 g/dL (ref 32.0–36.0)
MCV: 88.4 fL (ref 80.0–100.0)
MPV: 8.7 fL (ref 7.5–12.5)
Monocytes Relative: 4.5 %
Neutro Abs: 10074 {cells}/uL — ABNORMAL HIGH (ref 1500–7800)
Neutrophils Relative %: 81.9 %
Platelets: 407 10*3/uL — ABNORMAL HIGH (ref 140–400)
RBC: 4.3 10*6/uL (ref 3.80–5.10)
RDW: 13.4 % (ref 11.0–15.0)
Total Lymphocyte: 12.5 %
WBC: 12.3 10*3/uL — ABNORMAL HIGH (ref 3.8–10.8)

## 2023-08-31 LAB — URINE CULTURE
MICRO NUMBER:: 15636563
SPECIMEN QUALITY:: ADEQUATE

## 2023-08-31 LAB — HEMOGLOBIN A1C
Hgb A1c MFr Bld: 5.9 %{Hb} — ABNORMAL HIGH (ref <5.7)
Mean Plasma Glucose: 123 mg/dL
eAG (mmol/L): 6.8 mmol/L

## 2023-08-31 LAB — T4, FREE: Free T4: 1.1 ng/dL (ref 0.8–1.8)

## 2023-08-31 LAB — TSH: TSH: 2.11 m[IU]/L

## 2023-08-31 LAB — VITAMIN D 25 HYDROXY (VIT D DEFICIENCY, FRACTURES): Vit D, 25-Hydroxy: 25 ng/mL — ABNORMAL LOW (ref 30–100)

## 2023-08-31 LAB — VITAMIN B12: Vitamin B-12: 213 pg/mL (ref 200–1100)

## 2023-09-01 ENCOUNTER — Telehealth (INDEPENDENT_AMBULATORY_CARE_PROVIDER_SITE_OTHER): Payer: 59 | Admitting: Family Medicine

## 2023-09-01 ENCOUNTER — Telehealth: Payer: Self-pay

## 2023-09-01 ENCOUNTER — Encounter: Payer: Self-pay | Admitting: Family Medicine

## 2023-09-01 DIAGNOSIS — J0110 Acute frontal sinusitis, unspecified: Secondary | ICD-10-CM | POA: Diagnosis not present

## 2023-09-01 MED ORDER — AMOXICILLIN-POT CLAVULANATE 875-125 MG PO TABS: 1.0000 | ORAL_TABLET | Freq: Two times a day (BID) | ORAL | 0 refills | Status: DC

## 2023-09-01 MED ORDER — HYDROCOD POLI-CHLORPHE POLI ER 10-8 MG/5ML PO SUER: 5.0000 mL | Freq: Two times a day (BID) | ORAL | 0 refills | Status: DC | PRN

## 2023-09-01 NOTE — Patient Instructions (Addendum)

## 2023-09-01 NOTE — Progress Notes (Signed)
Subjective:    Patient ID: Stacey Taylor, female    DOB: 1981-07-14, 42 y.o.   MRN: [id number]  Stacey Taylor is a 42 y.o. female presenting on 09/01/2023 for Sinus Problem  Virtual / Telehealth Encounter - Video Visit via MyChart The purpose of this virtual visit is to provide medical care while limiting exposure to the novel coronavirus (COVID19) for both patient and office staff.  Consent was obtained for remote visit:  Yes.   Answered questions that patient had about telehealth interaction:  Yes.   I discussed the limitations, risks, security and privacy concerns of performing an evaluation and management service by video/telephone. I also discussed with the patient that there may be a patient responsible charge related to this service. The patient expressed understanding and agreed to proceed.  Patient Location: Home Provider Location: [facility] (Office)  Participants in virtual visit: - Patient: Stacey Taylor - CMA: Shirley Muscat CMA - Provider: Dr Althea Charon   HPI  Discussed the use of AI scribe software for clinical note transcription with the patient, who gave verbal consent to proceed.     The patient, eight days post-flu vaccination, reports worsening symptoms of congestion and pressure, suggestive of a sinus infection. She also notes a hoarse voice but denies any breathing issues or fever. The patient has been self-treating with Theraflu, Tylenol, and allergy decongestants, but reports only limited relief. She also mentions a persistent cough and throat swelling, which is worse on the right side. The patient has a history of sinus infections, with the most recent treatment occurring earlier in the year. She has not been on any new medications and has not reported any other health issues.         08/24/2023    3:56 PM 03/14/2022    9:05 AM 05/29/2021    1:42 PM  Depression screen PHQ 2/9  Decreased Interest 0 1 0  Down, Depressed, Hopeless 0 0 0   PHQ - 2 Score 0 1 0  Altered sleeping 0 1 0  Tired, decreased energy 1 1 1   Change in appetite 0 0 1  Feeling bad or failure about yourself  0 0 0  Trouble concentrating 0 0 0  Moving slowly or fidgety/restless 0 0 0  Suicidal thoughts 0 0 0  PHQ-9 Score 1 3 2   Difficult doing work/chores Not difficult at all Not difficult at all Not difficult at all    Social History   Tobacco Use   Smoking status: Never   Smokeless tobacco: Never  Vaping Use   Vaping status: Never Used  Substance Use Topics   Alcohol use: No    Alcohol/week: 0.0 standard drinks of alcohol   Drug use: No    Review of Systems Per HPI unless specifically indicated above     Objective:    LMP 01/06/2020   Wt Readings from Last 3 Encounters:  08/24/23 275 lb (124.7 kg)  02/10/23 272 lb (123.4 kg)  01/20/23 273 lb (123.8 kg)    Physical Exam  Note examination was completely remotely via video observation objective data only  Gen - well-appearing, no acute distress or apparent pain, comfortable HEENT - eyes appear clear without discharge or redness Heart/Lungs - cannot examine virtually - observed occasional coughing Abd - cannot examine virtually  Skin - face visible today- no rash Neuro - awake, alert, oriented Psych - not anxious appearing    Results for orders placed or performed during the hospital  encounter of 08/26/23  POCT rapid strep A  Result Value Ref Range   Rapid Strep A Screen Negative Negative      Assessment & Plan:   Problem List Items Addressed This Visit   None Visit Diagnoses     Acute non-recurrent frontal sinusitis    -  Primary   Relevant Medications   amoxicillin-clavulanate (AUGMENTIN) 875-125 MG tablet   chlorpheniramine-HYDROcodone (TUSSIONEX) 10-8 MG/5ML       Assessment and Plan    sinusitis Worsening congestion, pressure, and hoarseness over the past 8 days. No fever or shortness of breath. Limited relief with Theraflu, Tylenol, and  decongestants. -Start Augmentin for 10 days to cover both sinus and throat infection. -Add Tussionex cough syrup, twice a day as needed for cough. Already on decongestant -Consider adding a steroid if cough and wheezing persist after this treatment. Follow up as needed        Meds ordered this encounter  Medications   amoxicillin-clavulanate (AUGMENTIN) 875-125 MG tablet    Sig: Take 1 tablet by mouth 2 (two) times daily.    Dispense:  20 tablet    Refill:  0   chlorpheniramine-HYDROcodone (TUSSIONEX) 10-8 MG/5ML    Sig: Take 5 mLs by mouth every 12 (twelve) hours as needed for cough.    Dispense:  115 mL    Refill:  0      Follow up plan: Return if symptoms worsen or fail to improve.   Patient verbalizes understanding with the above medical recommendations including the limitation of remote medical advice.  Specific follow-up and call-back criteria were given for patient to follow-up or seek medical care more urgently if needed.  Total duration of direct patient care provided via video conference: 10 minutes    Saralyn Pilar, DO [facility] 09/01/2023, 4:56 PM

## 2023-09-01 NOTE — Telephone Encounter (Signed)
I spoke to patient on phone related to today's visit. She requested documentation of her recent flu vaccine to provide to her employer. I will scan and send the specific documentation via my chart per her request.

## 2023-09-02 DIAGNOSIS — G4733 Obstructive sleep apnea (adult) (pediatric): Secondary | ICD-10-CM | POA: Diagnosis not present

## 2023-09-02 DIAGNOSIS — R002 Palpitations: Secondary | ICD-10-CM | POA: Diagnosis not present

## 2023-09-02 DIAGNOSIS — R0789 Other chest pain: Secondary | ICD-10-CM | POA: Diagnosis not present

## 2023-09-02 DIAGNOSIS — R011 Cardiac murmur, unspecified: Secondary | ICD-10-CM | POA: Diagnosis not present

## 2023-09-02 DIAGNOSIS — I10 Essential (primary) hypertension: Secondary | ICD-10-CM | POA: Diagnosis not present

## 2023-09-02 DIAGNOSIS — R42 Dizziness and giddiness: Secondary | ICD-10-CM | POA: Diagnosis not present

## 2023-09-02 DIAGNOSIS — Z6841 Body Mass Index (BMI) 40.0 and over, adult: Secondary | ICD-10-CM | POA: Diagnosis not present

## 2023-09-07 DIAGNOSIS — Z111 Encounter for screening for respiratory tuberculosis: Secondary | ICD-10-CM | POA: Diagnosis not present

## 2023-09-10 DIAGNOSIS — Z0279 Encounter for issue of other medical certificate: Secondary | ICD-10-CM | POA: Diagnosis not present

## 2023-09-10 DIAGNOSIS — Z111 Encounter for screening for respiratory tuberculosis: Secondary | ICD-10-CM | POA: Diagnosis not present

## 2023-09-11 ENCOUNTER — Emergency Department: Payer: 59

## 2023-09-11 ENCOUNTER — Other Ambulatory Visit: Payer: Self-pay

## 2023-09-11 ENCOUNTER — Encounter: Payer: Self-pay | Admitting: Family Medicine

## 2023-09-11 ENCOUNTER — Emergency Department
Admission: EM | Admit: 2023-09-11 | Discharge: 2023-09-11 | Disposition: A | Discharge status: Home / Self Care | Payer: 59 | Attending: Emergency Medicine | Admitting: Emergency Medicine

## 2023-09-11 DIAGNOSIS — M62830 Muscle spasm of back: Secondary | ICD-10-CM | POA: Insufficient documentation

## 2023-09-11 DIAGNOSIS — E119 Type 2 diabetes mellitus without complications: Secondary | ICD-10-CM | POA: Diagnosis not present

## 2023-09-11 DIAGNOSIS — M549 Dorsalgia, unspecified: Secondary | ICD-10-CM | POA: Diagnosis not present

## 2023-09-11 DIAGNOSIS — M545 Low back pain, unspecified: Secondary | ICD-10-CM | POA: Diagnosis present

## 2023-09-11 DIAGNOSIS — I10 Essential (primary) hypertension: Secondary | ICD-10-CM | POA: Insufficient documentation

## 2023-09-11 LAB — BASIC METABOLIC PANEL
Anion gap: 9 (ref 5–15)
BUN: 10 mg/dL (ref 6–20)
CO2: 25 mmol/L (ref 22–32)
Calcium: 8.3 mg/dL — ABNORMAL LOW (ref 8.9–10.3)
Chloride: 103 mmol/L (ref 98–111)
Creatinine, Ser: 0.57 mg/dL (ref 0.44–1.00)
GFR, Estimated: >60 mL/min (ref >60)
Glucose, Bld: 112 mg/dL — ABNORMAL HIGH (ref 70–99)
Potassium: 3.2 mmol/L — ABNORMAL LOW (ref 3.5–5.1)
Sodium: 137 mmol/L (ref 135–145)

## 2023-09-11 LAB — CBC
HCT: 31.4 % — ABNORMAL LOW (ref 36.0–46.0)
Hemoglobin: 10.4 g/dL — ABNORMAL LOW (ref 12.0–15.0)
MCH: 28.5 pg (ref 26.0–34.0)
MCHC: 33.1 g/dL (ref 30.0–36.0)
MCV: 86 fL (ref 80.0–100.0)
Platelets: 349 10*3/uL (ref 150–400)
RBC: 3.65 MIL/uL — ABNORMAL LOW (ref 3.87–5.11)
RDW: 13.3 % (ref 11.5–15.5)
WBC: 7.6 10*3/uL (ref 4.0–10.5)
nRBC: 0 % (ref 0.0–0.2)

## 2023-09-11 LAB — URINALYSIS, ROUTINE W REFLEX MICROSCOPIC
Bilirubin Urine: NEGATIVE
Glucose, UA: NEGATIVE mg/dL
Ketones, ur: NEGATIVE mg/dL
Leukocytes,Ua: NEGATIVE
Nitrite: NEGATIVE
Protein, ur: NEGATIVE mg/dL
Specific Gravity, Urine: 1.029 (ref 1.005–1.030)
pH: 5 (ref 5.0–8.0)

## 2023-09-11 LAB — POC URINE PREG, ED: Preg Test, Ur: NEGATIVE

## 2023-09-11 MED ORDER — KETOROLAC TROMETHAMINE 30 MG/ML IJ SOLN
30.0000 mg | Freq: Once | INTRAMUSCULAR | Status: AC
Start: 2023-09-11 — End: 2023-09-11
  Administered 2023-09-11: 30 mg via INTRAMUSCULAR
  Filled 2023-09-11: qty 1

## 2023-09-11 MED ORDER — NAPROXEN 500 MG PO TABS
500.0000 mg | ORAL_TABLET | Freq: Two times a day (BID) | ORAL | 0 refills | Status: DC
  Filled 2023-09-11: qty 20, 10d supply, fill #0

## 2023-09-11 MED ORDER — HYDROCODONE-ACETAMINOPHEN 5-325 MG PO TABS
1.0000 | ORAL_TABLET | Freq: Four times a day (QID) | ORAL | 0 refills | Status: DC | PRN
  Filled 2023-09-11: qty 15, 4d supply, fill #0

## 2023-09-11 MED ORDER — METHOCARBAMOL 500 MG PO TABS
500.0000 mg | ORAL_TABLET | Freq: Four times a day (QID) | ORAL | 0 refills | Status: DC | PRN
  Filled 2023-09-11: qty 20, 3d supply, fill #0

## 2023-09-11 MED ORDER — CYCLOBENZAPRINE HCL 5 MG PO TABS
5.0000 mg | ORAL_TABLET | Freq: Three times a day (TID) | ORAL | 2 refills | Status: DC | PRN
  Filled 2023-09-11: qty 60, 20d supply, fill #0

## 2023-09-11 NOTE — ED Triage Notes (Signed)
Pt comes with c/o mid lower back and radiates around. Pt states possible kidney stone. Pt states she felt she wasn't emptying her bladder.

## 2023-09-11 NOTE — ED Provider Notes (Signed)
[facility] Provider Note    Event Date/Time   First MD Initiated Contact with Patient 09/11/23 [phone]     (approximate)   History   Back Pain   HPI  Stacey Taylor is a 42 y.o. female   resents to the ED with complaint of mid low back pain with radiation around to her right lower extremity anteriorly.  Patient states that a friend told her that she may have a kidney stone and she believes she was not emptying her bladder as well.  No previous history of urinary tract infections or kidney stones in the past.  Patient is unaware of any known injury to her back and has continued to ambulate without any assistance.  She has had some expired Flexeril that she has been taking at home with ibuprofen without any relief.  Patient has a history of chronic low back pain with left-sided sciatica, hypertension, diabetes, vitamin D deficiency, generalized anxiety disorder.      Physical Exam   Triage Vital Signs: ED Triage Vitals  Encounter Vitals Group     BP 09/11/23 0759 139/68     Systolic BP Percentile --      Diastolic BP Percentile --      Pulse Rate 09/11/23 0759 74     Resp 09/11/23 0759 18     Temp 09/11/23 0759 98.3 F (36.8 C)     Temp src --      SpO2 09/11/23 0759 96 %     Weight 09/11/23 0757 275 lb (124.7 kg)     Height 09/11/23 0757 5' (1.524 m)     Head Circumference --      Peak Flow --      Pain Score 09/11/23 0757 10     Pain Loc --      Pain Education --      Exclude from Growth Chart --     Most recent vital signs: Vitals:   09/11/23 0759 09/11/23 1136  BP: 139/68 130/70  Pulse: 74 70  Resp: 18 18  Temp: 98.3 F (36.8 C)   SpO2: 96% 97%     General: Awake, no distress.  CV:  Good peripheral perfusion.  Heart regular rate and rhythm. Resp:  Normal effort.  Clear bilaterally. Abd:  No distention.  Other:  Patient has tenderness on palpation of the lower lumbar region midline without step-offs.  Moderate tenderness on  palpation right paravertebral muscles.  Range of motion is guarded and increases pain.  Patient is able to stand and ambulate without any assistance.   ED Results / Procedures / Treatments   Labs (all labs ordered are listed, but only abnormal results are displayed) Labs Reviewed  URINALYSIS, ROUTINE W REFLEX MICROSCOPIC - Abnormal; Notable for the following components:      Result Value   Color, Urine YELLOW (*)    APPearance HAZY (*)    Hgb urine dipstick SMALL (*)    Bacteria, UA RARE (*)    All other components within normal limits  BASIC METABOLIC PANEL - Abnormal; Notable for the following components:   Potassium 3.2 (*)    Glucose, Bld 112 (*)    Calcium 8.3 (*)    All other components within normal limits  CBC - Abnormal; Notable for the following components:   RBC 3.65 (*)    Hemoglobin 10.4 (*)    HCT 31.4 (*)    All other components within normal limits  POC URINE PREG, ED  RADIOLOGY Lumbar spine x-ray images were reviewed by myself independent of the radiologist and was negative for compression fracture or malalignment.  Radiology report confirms.    PROCEDURES:  Critical Care performed:   Procedures   MEDICATIONS ORDERED IN ED: Medications  ketorolac (TORADOL) 30 MG/ML injection 30 mg (30 mg Intramuscular Given 09/11/23 1121)     IMPRESSION / MDM / ASSESSMENT AND PLAN / ED COURSE  I reviewed the triage vital signs and the nursing notes.   Differential diagnosis includes, but is not limited to, acute urinary tract infection, urolithiasis, musculoskeletal pain, lumbar strain, compression fracture lumbar spine  42 year old female presents to the ED with complaint of right sided low back pain with radiation.  Patient is unaware of any injury but does have a history of chronic low back pain with left-sided sciatica.  Urinalysis was reassuring and patient was made aware that there is no indication that this is a kidney stone.  Remaining lab work was  reassuring.  Lumbar spine x-ray was negative for any acute bony abnormalities.  Patient was given Toradol 30 mg IM while in the ED however she drove and we are unable to give her any medication that could cause drowsiness.  A prescription for Robaxin 500 mg 1 or 2 every 6 hours as needed for muscle spasms and hydrocodone was sent to the pharmacy.  Patient is aware that she cannot drive or operate machinery while taking this medication.  Naproxen 500 mg twice daily with food for inflammation and patient was given a note to remain out of work.  She has to follow-up with her PCP if any continued problems.      Patient's presentation is most consistent with acute illness / injury with system symptoms.  FINAL CLINICAL IMPRESSION(S) / ED DIAGNOSES   Final diagnoses:  Muscle spasm of back     Rx / DC Orders   ED Discharge Orders          Ordered    methocarbamol (ROBAXIN) 500 MG tablet  Every 6 hours PRN        09/11/23 1123    HYDROcodone-acetaminophen (NORCO/VICODIN) 5-325 MG tablet  Every 6 hours PRN        09/11/23 1123    naproxen (NAPROSYN) 500 MG tablet  2 times daily with meals        09/11/23 1123             Note:  This document was prepared using Dragon voice recognition software and may include unintentional dictation errors.   Tommi Rumps, PA-C 09/11/23 1230    Merwyn Katos, MD 09/11/23 [phone]

## 2023-09-11 NOTE — Discharge Instructions (Addendum)
Follow-up with your primary care provider if any continued problems or concerns.  Moist heat or ice to your back as needed for discomfort.  Take medication only as directed.  Be aware that the pain medication and muscle relaxant could cause drowsiness and increase your risk for injury therefore do not drive or operate machinery while taking it.

## 2023-09-11 NOTE — ED Notes (Signed)
See triage note.  Presents with right lower back and flank pain  States pain started several days ago States she has taken some of her own meds with min relief  Denies any injury or fever

## 2023-09-15 DIAGNOSIS — R011 Cardiac murmur, unspecified: Secondary | ICD-10-CM | POA: Diagnosis not present

## 2023-09-28 ENCOUNTER — Other Ambulatory Visit: Payer: Self-pay | Admitting: Family Medicine

## 2023-09-28 ENCOUNTER — Other Ambulatory Visit: Payer: Self-pay

## 2023-09-28 DIAGNOSIS — E876 Hypokalemia: Secondary | ICD-10-CM | POA: Diagnosis not present

## 2023-09-28 DIAGNOSIS — Z6841 Body Mass Index (BMI) 40.0 and over, adult: Secondary | ICD-10-CM | POA: Diagnosis not present

## 2023-09-28 DIAGNOSIS — R42 Dizziness and giddiness: Secondary | ICD-10-CM | POA: Diagnosis not present

## 2023-09-28 DIAGNOSIS — G4733 Obstructive sleep apnea (adult) (pediatric): Secondary | ICD-10-CM | POA: Diagnosis not present

## 2023-09-28 DIAGNOSIS — M62830 Muscle spasm of back: Secondary | ICD-10-CM

## 2023-09-28 DIAGNOSIS — I517 Cardiomegaly: Secondary | ICD-10-CM | POA: Diagnosis not present

## 2023-09-28 DIAGNOSIS — R0602 Shortness of breath: Secondary | ICD-10-CM | POA: Diagnosis not present

## 2023-09-28 DIAGNOSIS — I10 Essential (primary) hypertension: Secondary | ICD-10-CM | POA: Diagnosis not present

## 2023-09-28 MED ORDER — SPIRONOLACTONE 25 MG PO TABS
12.5000 mg | ORAL_TABLET | Freq: Every day | ORAL | 11 refills | Status: DC
  Filled 2023-09-28: qty 15, 30d supply, fill #0
  Filled 2024-01-02: qty 15, 30d supply, fill #1

## 2023-09-29 ENCOUNTER — Other Ambulatory Visit: Payer: Self-pay | Admitting: Family Medicine

## 2023-09-29 ENCOUNTER — Other Ambulatory Visit: Payer: Self-pay

## 2023-09-29 DIAGNOSIS — M62830 Muscle spasm of back: Secondary | ICD-10-CM

## 2023-09-29 NOTE — Telephone Encounter (Signed)
Requested medication (s) are due for refill today - no  Requested medication (s) are on the active medication list -no  Future visit scheduled -no  Last refill: unsure  Notes to clinic: non delegated Rx- medication no longer listed on current medication list- needs provider review   Requested Prescriptions  Pending Prescriptions Disp Refills   cyclobenzaprine (FLEXERIL) 5 MG tablet 60 tablet 2    Sig: Take 1-2 tablets (5-10 mg total) by mouth 3 (three) times daily as needed for muscle spasms.     Not Delegated - Analgesics:  Muscle Relaxants Failed - 09/28/2023  1:17 PM      Failed - This refill cannot be delegated      Passed - Valid encounter within last 6 months    Recent Outpatient Visits           4 weeks ago Acute non-recurrent frontal sinusitis   [place] [facility] Smitty Cords, DO   1 month ago Essential hypertension   [place] [facility] Smitty Cords, DO   7 months ago Dysuria   [place] [facility] [place], Salvadore Oxford, NP   8 months ago COVID-19   [facility] Smitty Cords, DO   1 year ago Morbid obesity with BMI of 50.0-59.9, adult [facility])   [place] [facility] [place], Netta Neat, DO                 Requested Prescriptions  Pending Prescriptions Disp Refills   cyclobenzaprine (FLEXERIL) 5 MG tablet 60 tablet 2    Sig: Take 1-2 tablets (5-10 mg total) by mouth 3 (three) times daily as needed for muscle spasms.     Not Delegated - Analgesics:  Muscle Relaxants Failed - 09/28/2023  1:17 PM      Failed - This refill cannot be delegated      Passed - Valid encounter within last 6 months    Recent Outpatient Visits           4 weeks ago Acute non-recurrent frontal sinusitis   [place] [facility] [place], Netta Neat, DO   1 month ago Essential hypertension   [place] [facility] Smitty Cords, DO   7 months ago Dysuria   [place] [facility] [place], Salvadore Oxford, NP   8 months ago COVID-19   [facility] Smitty Cords, DO   1 year ago Morbid obesity with BMI of 50.0-59.9, adult [facility])   [place] [facility] [place], Netta Neat, [place]

## 2023-09-29 NOTE — Telephone Encounter (Signed)
Please check with patient to clarify which medicine she prefers  She was ordered Methocarbamol muscle relaxant on 09/11/23  Now we received request for Flexeril today  Can you clarify with her to see which muscle relaxant she needs re ordered and how often she plans to take it so we can figure out pill quantity?  Thank you  Saralyn Pilar, DO [facility] 09/29/2023, 12:39 PM

## 2023-09-30 ENCOUNTER — Other Ambulatory Visit: Payer: Self-pay

## 2023-09-30 NOTE — Telephone Encounter (Signed)
Requested medication (s) are due for refill today - no  Requested medication (s) are on the active medication list -no  Future visit scheduled -no  Last refill: 08/22/22  Notes to clinic: no longer listed on current medication list, non delegated Rx  Requested Prescriptions  Pending Prescriptions Disp Refills   cyclobenzaprine (FLEXERIL) 5 MG tablet 60 tablet 2    Sig: Take 1-2 tablets (5-10 mg total) by mouth 3 (three) times daily as needed for muscle spasms.     Not Delegated - Analgesics:  Muscle Relaxants Failed - 09/29/2023  5:50 PM      Failed - This refill cannot be delegated      Passed - Valid encounter within last 6 months    Recent Outpatient Visits           4 weeks ago Acute non-recurrent frontal sinusitis   [place] [facility] Smitty Cords, DO   1 month ago Essential hypertension   [facility] Smitty Cords, DO   7 months ago Dysuria   [place] [facility] [place], Salvadore Oxford, NP   8 months ago COVID-19   [facility] Smitty Cords, DO   1 year ago Morbid obesity with BMI of 50.0-59.9, adult [facility])   [place] [facility] [place], Netta Neat, DO                 Requested Prescriptions  Pending Prescriptions Disp Refills   cyclobenzaprine (FLEXERIL) 5 MG tablet 60 tablet 2    Sig: Take 1-2 tablets (5-10 mg total) by mouth 3 (three) times daily as needed for muscle spasms.     Not Delegated - Analgesics:  Muscle Relaxants Failed - 09/29/2023  5:50 PM      Failed - This refill cannot be delegated      Passed - Valid encounter within last 6 months    Recent Outpatient Visits           4 weeks ago Acute non-recurrent frontal sinusitis   [place] [facility] [place], Netta Neat, DO   1 month ago Essential hypertension   [place] [facility] Smitty Cords, DO   7 months ago Dysuria   [place] [facility] [place], Salvadore Oxford, NP   8 months ago COVID-19   [facility] Smitty Cords, DO   1 year ago Morbid obesity with BMI of 50.0-59.9, adult [facility])    [facility] [place], Netta Neat, [place]

## 2023-10-07 DIAGNOSIS — M25561 Pain in right knee: Secondary | ICD-10-CM | POA: Diagnosis not present

## 2023-10-07 DIAGNOSIS — M170 Bilateral primary osteoarthritis of knee: Secondary | ICD-10-CM | POA: Diagnosis not present

## 2023-10-07 DIAGNOSIS — M25562 Pain in left knee: Secondary | ICD-10-CM | POA: Diagnosis not present

## 2023-10-07 DIAGNOSIS — G8929 Other chronic pain: Secondary | ICD-10-CM | POA: Diagnosis not present

## 2023-10-15 ENCOUNTER — Other Ambulatory Visit: Payer: Self-pay

## 2023-10-15 MED ORDER — PREDNISONE 10 MG PO TABS
ORAL_TABLET | ORAL | 0 refills | Status: AC
  Filled 2023-10-15: qty 21, 6d supply, fill #0

## 2023-11-03 DIAGNOSIS — R002 Palpitations: Secondary | ICD-10-CM | POA: Diagnosis not present

## 2023-12-03 ENCOUNTER — Ambulatory Visit
Admission: RE | Admit: 2023-12-03 | Discharge: 2023-12-03 | Disposition: A | Discharge status: Home / Self Care | Payer: 59 | Source: Ambulatory Visit | Attending: Emergency Medicine | Admitting: Emergency Medicine

## 2023-12-03 VITALS — BP 142/90 | HR 106 | Temp 99.3°F | Resp 18

## 2023-12-03 DIAGNOSIS — J0100 Acute maxillary sinusitis, unspecified: Secondary | ICD-10-CM

## 2023-12-03 MED ORDER — AMOXICILLIN-POT CLAVULANATE 875-125 MG PO TABS: 1.0000 | ORAL_TABLET | Freq: Two times a day (BID) | ORAL | 0 refills | Status: DC

## 2023-12-03 NOTE — ED Triage Notes (Signed)
Congestion, sinus pressure x 1.5 weeks, sore throat, runny nose, headache that started yesterday.  Taking tylenol and OTC sinus medication.   Pt states she took a home Covid test today and it was negative.

## 2023-12-03 NOTE — ED Provider Notes (Signed)
Renaldo Fiddler    CSN: [id number] Arrival date & time: 12/03/23  1642      History   Chief Complaint Chief Complaint  Patient presents with   Sore Throat    Sinus pressure with headache and sore throat - Entered by patient   Nasal Congestion    HPI Stacey Taylor is a 43 y.o. female.  Patient presents with 10-day history of congestion, sinus pressure, runny nose mild cough.  She developed sore throat in the last couple of days.  Treatment attempted with OTC sinus medication.  Negative COVID test at home.  No fever or shortness of breath.  The history is provided by the patient and medical records.    Past Medical History:  Diagnosis Date   Abnormal Pap smear of anus    HGSIL   Anemia    Back pain    Cervical dysplasia    CIN III   CHF (congestive heart failure) (HCC)    Endometriosis    Gallbladder problem    Headache    Heart murmur    Leg edema    Premature delivery    x2    Patient Active Problem List   Diagnosis Date Noted   Chronic rhinitis 11/03/2022   Generalized anxiety disorder with panic attacks 11/27/2020   Diastolic dysfunction 11/07/2020   OSA (obstructive sleep apnea) 03/13/2020   Abnormal uterine bleeding (AUB)    S/P hysterectomy 03/08/2020   S/P laparoscopic supracervical hysterectomy 03/08/2020   Lymphedema 08/15/2019   Bilateral primary osteoarthritis of knee 06/17/2019   Dizziness 09/16/2018   Headache disorder 09/16/2018   Chronic migraine 07/15/2018   Onychomycosis 03/11/2018   Vitamin B12 deficiency 01/11/2018   Pre-diabetes 01/11/2018   Other fatigue 12/10/2017   Shortness of breath on exertion 12/10/2017   Edema 12/10/2017   Vitamin D deficiency 12/10/2017   Bilateral lower extremity edema 07/09/2017   Dyspnea on exertion 07/09/2017   Essential hypertension 07/09/2017   Anemia 07/09/2017   Chronic low back pain with left-sided sciatica 09/09/2016   Morbid obesity with BMI of 50.0-59.9, adult (HCC) 11/23/2015     Past Surgical History:  Procedure Laterality Date   ABDOMINAL HYSTERECTOMY     CERVICAL BIOPSY  W/ LOOP ELECTRODE EXCISION  11/2005   CESAREAN SECTION  2008/2015   CESAREAN SECTION WITH BILATERAL TUBAL LIGATION  2015   CHOLECYSTECTOMY  2003   COLPOSCOPY  2005, 2006   COMBINED HYSTEROSCOPY DIAGNOSTIC / D&C  2014   CYSTOSCOPY N/A 03/08/2020   Procedure: CYSTOSCOPY;  Surgeon: Vena Austria, MD;  Location: [facility];  Service: Gynecology;  Laterality: N/A;   LAPAROSCOPIC SUPRACERVICAL HYSTERECTOMY N/A 03/08/2020   Procedure: LAPAROSCOPIC SUPRACERVICAL HYSTERECTOMY WITH BILATERAL SALPINGECTOMY;  Surgeon: Vena Austria, MD;  Location: [facility];  Service: Gynecology;  Laterality: N/A;    OB History     Gravida  5   Para  5   Term  3   Preterm  2   AB      Living  5      SAB      IAB      Ectopic      Multiple      Live Births  5            Home Medications    Prior to Admission medications   Medication Sig Start Date End Date Taking? Authorizing Provider  amoxicillin-clavulanate (AUGMENTIN) 875-125 MG tablet Take 1 tablet by mouth every 12 (twelve) hours. 12/03/23  Yes  Mickie Bail, NP  valsartan (DIOVAN) 40 MG tablet Take 1 tablet (40 mg total) by mouth daily. 08/24/23  Yes Karamalegos, Netta Neat, DO  chlorpheniramine-HYDROcodone (TUSSIONEX) 10-8 MG/5ML Take 5 mLs by mouth every 12 (twelve) hours as needed for cough. 09/01/23   Karamalegos, Netta Neat, DO  HYDROcodone-acetaminophen (NORCO/VICODIN) 5-325 MG tablet Take 1 tablet by mouth every 6 (six) hours as needed. 09/11/23 09/10/24  Tommi Rumps, PA-C  methocarbamol (ROBAXIN) 500 MG tablet Take 1-2 tablets (500-1,000 mg total) by mouth every 6 (six) hours as needed for muscle spasms 09/11/23   Tommi Rumps, PA-C  naproxen (NAPROSYN) 500 MG tablet Take 1 tablet (500 mg total) by mouth 2 (two) times daily with a meal. 09/11/23   Tommi Rumps, PA-C  spironolactone (ALDACTONE) 25 MG tablet  Take 0.5 tablets (12.5 mg total) by mouth daily. 09/28/23     loratadine (CLARITIN) 10 MG tablet Take 10 mg by mouth daily.  09/04/20  [provider]    Family History Family History  Adopted: Yes  Family history unknown: Yes    Social History Social History   Tobacco Use   Smoking status: Never   Smokeless tobacco: Never  Vaping Use   Vaping status: Never Used  Substance Use Topics   Alcohol use: No    Alcohol/week: 0.0 standard drinks of alcohol   Drug use: No     Allergies   Latex and Tramadol   Review of Systems Review of Systems  Constitutional:  Negative for chills and fever.  HENT:  Positive for congestion, postnasal drip, rhinorrhea, sinus pressure and sore throat. Negative for ear pain.   Respiratory:  Positive for cough. Negative for shortness of breath.      Physical Exam Triage Vital Signs ED Triage Vitals  Encounter Vitals Group     BP      Systolic BP Percentile      Diastolic BP Percentile      Pulse      Resp      Temp      Temp src      SpO2      Weight      Height      Head Circumference      Peak Flow      Pain Score      Pain Loc      Pain Education      Exclude from Growth Chart    No data found.  Updated Vital Signs BP (!) 142/90 (BP Location: Left Arm)   Pulse (!) 106   Temp 99.3 F (37.4 C)   Resp 18   LMP 01/06/2020   SpO2 96%   Visual Acuity Right Eye Distance:   Left Eye Distance:   Bilateral Distance:    Right Eye Near:   Left Eye Near:    Bilateral Near:     Physical Exam Constitutional:      General: She is not in acute distress. HENT:     Right Ear: Tympanic membrane normal.     Left Ear: Tympanic membrane normal.     Nose: Congestion present.     Mouth/Throat:     Mouth: Mucous membranes are moist.     Pharynx: Oropharynx is clear.  Cardiovascular:     Rate and Rhythm: Normal rate and regular rhythm.     Heart sounds: Normal heart sounds.  Pulmonary:     Effort: Pulmonary effort is  normal. No respiratory distress.  Breath sounds: Normal breath sounds.  Neurological:     Mental Status: She is alert.      UC Treatments / Results  Labs (all labs ordered are listed, but only abnormal results are displayed) Labs Reviewed - No data to display  EKG   Radiology No results found.  Procedures Procedures (including critical care time)  Medications Ordered in UC Medications - No data to display  Initial Impression / Assessment and Plan / UC Course  I have reviewed the triage vital signs and the nursing notes.  Pertinent labs & imaging results that were available during my care of the patient were reviewed by me and considered in my medical decision making (see chart for details).   Acute sinusitis.  Patient has been symptomatic for 10 days and is not improving with OTC treatment.  Treating today with Augmentin.  Education provided on sinus infection.  Instructed her to follow-up with her PCP if she is not improving.  She agrees to plan of care.   Final Clinical Impressions(s) / UC Diagnoses   Final diagnoses:  Acute non-recurrent maxillary sinusitis     Discharge Instructions      Take the Augmentin as directed.  Follow-up with your primary care provider if your symptoms are not improving.      ED Prescriptions     Medication Sig Dispense Auth. Provider   amoxicillin-clavulanate (AUGMENTIN) 875-125 MG tablet Take 1 tablet by mouth every 12 (twelve) hours. 14 tablet Mickie Bail, NP      PDMP not reviewed this encounter.   Mickie Bail, NP 12/03/23 [phone]

## 2023-12-03 NOTE — Discharge Instructions (Addendum)
Take the Augmentin as directed.  Follow up with your primary care provider if your symptoms are not improving.

## 2023-12-30 DIAGNOSIS — L638 Other alopecia areata: Secondary | ICD-10-CM | POA: Diagnosis not present

## 2023-12-30 DIAGNOSIS — L578 Other skin changes due to chronic exposure to nonionizing radiation: Secondary | ICD-10-CM | POA: Diagnosis not present

## 2023-12-30 DIAGNOSIS — L811 Chloasma: Secondary | ICD-10-CM | POA: Diagnosis not present

## 2024-01-05 ENCOUNTER — Other Ambulatory Visit (HOSPITAL_COMMUNITY): Payer: Self-pay

## 2024-01-18 DIAGNOSIS — M170 Bilateral primary osteoarthritis of knee: Secondary | ICD-10-CM | POA: Diagnosis not present

## 2024-02-08 ENCOUNTER — Other Ambulatory Visit: Payer: Self-pay

## 2024-02-08 DIAGNOSIS — L648 Other androgenic alopecia: Secondary | ICD-10-CM | POA: Diagnosis not present

## 2024-02-08 DIAGNOSIS — L638 Other alopecia areata: Secondary | ICD-10-CM | POA: Diagnosis not present

## 2024-02-08 DIAGNOSIS — L811 Chloasma: Secondary | ICD-10-CM | POA: Diagnosis not present

## 2024-02-08 MED ORDER — CLOBETASOL PROPIONATE 0.05 % EX SOLN
1.0000 | Freq: Every day | CUTANEOUS | 1 refills | Status: DC
  Filled 2024-02-08: qty 50, 30d supply, fill #0

## 2024-02-08 MED ORDER — MINOXIDIL 2.5 MG PO TABS
2.5000 mg | ORAL_TABLET | Freq: Every day | ORAL | 1 refills | Status: DC
  Filled 2024-02-08: qty 90, 90d supply, fill #0

## 2024-02-08 MED ORDER — SPIRONOLACTONE 100 MG PO TABS
100.0000 mg | ORAL_TABLET | Freq: Every day | ORAL | 1 refills | Status: DC
  Filled 2024-02-08: qty 90, 90d supply, fill #0

## 2024-02-16 ENCOUNTER — Other Ambulatory Visit: Payer: Self-pay

## 2024-03-02 ENCOUNTER — Telehealth: Payer: Self-pay

## 2024-03-02 NOTE — Telephone Encounter (Signed)
 Copied from CRM [id number]. Topic: General - Other >> Mar 02, 2024 11:47 AM Phil Braun wrote: Reason for CRM: Pt called and stated that [facility] is sending over fax to get cpap machine supplies. Please watch out for it and sign and send back.

## 2024-03-08 NOTE — Telephone Encounter (Signed)
 Can you contact [facility] to follow up on this? I have not seen it yet  Thank you!  Domingo Friend, DO [facility] 03/08/2024, 9:40 AM

## 2024-03-08 NOTE — Telephone Encounter (Signed)
 Spoke to cindy they are faxing over the orders.

## 2024-03-27 DIAGNOSIS — R000 Tachycardia, unspecified: Secondary | ICD-10-CM | POA: Diagnosis not present

## 2024-03-27 DIAGNOSIS — Z885 Allergy status to narcotic agent status: Secondary | ICD-10-CM | POA: Diagnosis not present

## 2024-03-27 DIAGNOSIS — R7303 Prediabetes: Secondary | ICD-10-CM | POA: Diagnosis not present

## 2024-03-27 DIAGNOSIS — T148XXA Other injury of unspecified body region, initial encounter: Secondary | ICD-10-CM | POA: Diagnosis not present

## 2024-03-27 DIAGNOSIS — S86911A Strain of unspecified muscle(s) and tendon(s) at lower leg level, right leg, initial encounter: Secondary | ICD-10-CM | POA: Diagnosis not present

## 2024-03-27 DIAGNOSIS — M79661 Pain in right lower leg: Secondary | ICD-10-CM | POA: Diagnosis not present

## 2024-03-27 DIAGNOSIS — R079 Chest pain, unspecified: Secondary | ICD-10-CM | POA: Diagnosis not present

## 2024-03-27 DIAGNOSIS — R0789 Other chest pain: Secondary | ICD-10-CM | POA: Diagnosis not present

## 2024-03-27 DIAGNOSIS — I10 Essential (primary) hypertension: Secondary | ICD-10-CM | POA: Diagnosis not present

## 2024-03-27 DIAGNOSIS — Z91040 Latex allergy status: Secondary | ICD-10-CM | POA: Diagnosis not present

## 2024-03-27 DIAGNOSIS — R7989 Other specified abnormal findings of blood chemistry: Secondary | ICD-10-CM | POA: Diagnosis not present

## 2024-03-27 DIAGNOSIS — Z87891 Personal history of nicotine dependence: Secondary | ICD-10-CM | POA: Diagnosis not present

## 2024-03-27 DIAGNOSIS — R791 Abnormal coagulation profile: Secondary | ICD-10-CM | POA: Diagnosis not present

## 2024-04-29 DIAGNOSIS — H5213 Myopia, bilateral: Secondary | ICD-10-CM | POA: Diagnosis not present

## 2024-04-29 DIAGNOSIS — H04123 Dry eye syndrome of bilateral lacrimal glands: Secondary | ICD-10-CM | POA: Diagnosis not present

## 2024-05-13 ENCOUNTER — Other Ambulatory Visit: Payer: Self-pay | Admitting: Family Medicine

## 2024-05-13 DIAGNOSIS — I10 Essential (primary) hypertension: Secondary | ICD-10-CM

## 2024-05-13 DIAGNOSIS — E559 Vitamin D deficiency, unspecified: Secondary | ICD-10-CM

## 2024-05-13 DIAGNOSIS — E785 Hyperlipidemia, unspecified: Secondary | ICD-10-CM

## 2024-05-13 DIAGNOSIS — Z0000 Encounter for general adult medical examination without abnormal findings: Secondary | ICD-10-CM

## 2024-05-13 DIAGNOSIS — E538 Deficiency of other specified B group vitamins: Secondary | ICD-10-CM

## 2024-05-13 DIAGNOSIS — R7303 Prediabetes: Secondary | ICD-10-CM

## 2024-05-16 ENCOUNTER — Other Ambulatory Visit

## 2024-05-17 DIAGNOSIS — E785 Hyperlipidemia, unspecified: Secondary | ICD-10-CM | POA: Diagnosis not present

## 2024-05-17 DIAGNOSIS — E538 Deficiency of other specified B group vitamins: Secondary | ICD-10-CM | POA: Diagnosis not present

## 2024-05-17 DIAGNOSIS — Z0000 Encounter for general adult medical examination without abnormal findings: Secondary | ICD-10-CM | POA: Diagnosis not present

## 2024-05-17 DIAGNOSIS — Z6841 Body Mass Index (BMI) 40.0 and over, adult: Secondary | ICD-10-CM | POA: Diagnosis not present

## 2024-05-17 DIAGNOSIS — I10 Essential (primary) hypertension: Secondary | ICD-10-CM | POA: Diagnosis not present

## 2024-05-17 DIAGNOSIS — E559 Vitamin D deficiency, unspecified: Secondary | ICD-10-CM | POA: Diagnosis not present

## 2024-05-17 DIAGNOSIS — R7303 Prediabetes: Secondary | ICD-10-CM | POA: Diagnosis not present

## 2024-05-18 ENCOUNTER — Ambulatory Visit: Payer: Self-pay | Admitting: Internal Medicine

## 2024-05-18 LAB — CBC WITH DIFFERENTIAL/PLATELET
Absolute Lymphocytes: 2480 {cells}/uL (ref 850–3900)
Absolute Monocytes: 426 {cells}/uL (ref 200–950)
Basophils Absolute: 26 {cells}/uL (ref 0–200)
Basophils Relative: 0.3 %
Eosinophils Absolute: 191 {cells}/uL (ref 15–500)
Eosinophils Relative: 2.2 %
HCT: 34.2 % — ABNORMAL LOW (ref 35.0–45.0)
Hemoglobin: 11.2 g/dL — ABNORMAL LOW (ref 11.7–15.5)
MCH: 28.6 pg (ref 27.0–33.0)
MCHC: 32.7 g/dL (ref 32.0–36.0)
MCV: 87.2 fL (ref 80.0–100.0)
MPV: 8.8 fL (ref 7.5–12.5)
Monocytes Relative: 4.9 %
Neutro Abs: 5577 {cells}/uL (ref 1500–7800)
Neutrophils Relative %: 64.1 %
Platelets: 399 Thousand/uL (ref 140–400)
RBC: 3.92 Million/uL (ref 3.80–5.10)
RDW: 13.2 % (ref 11.0–15.0)
Total Lymphocyte: 28.5 %
WBC: 8.7 Thousand/uL (ref 3.8–10.8)

## 2024-05-18 LAB — COMPREHENSIVE METABOLIC PANEL WITH GFR
AG Ratio: 1.1 (calc) (ref 1.0–2.5)
ALT: 17 U/L (ref 6–29)
AST: 13 U/L (ref 10–30)
Albumin: 3.7 g/dL (ref 3.6–5.1)
Alkaline phosphatase (APISO): 59 U/L (ref 31–125)
BUN/Creatinine Ratio: 18 (calc) (ref 6–22)
BUN: 9 mg/dL (ref 7–25)
CO2: 27 mmol/L (ref 20–32)
Calcium: 9 mg/dL (ref 8.6–10.2)
Chloride: 104 mmol/L (ref 98–110)
Creat: 0.49 mg/dL — ABNORMAL LOW (ref 0.50–0.99)
Globulin: 3.3 g/dL (ref 1.9–3.7)
Glucose, Bld: 113 mg/dL — ABNORMAL HIGH (ref 65–99)
Potassium: 3.7 mmol/L (ref 3.5–5.3)
Sodium: 141 mmol/L (ref 135–146)
Total Bilirubin: 0.4 mg/dL (ref 0.2–1.2)
Total Protein: 7 g/dL (ref 6.1–8.1)
eGFR: 121 mL/min/1.73m2 (ref >=60)

## 2024-05-18 LAB — LIPID PANEL
Cholesterol: 159 mg/dL (ref <200)
HDL: 51 mg/dL (ref >=50)
LDL Cholesterol (Calc): 91 mg/dL
Non-HDL Cholesterol (Calc): 108 mg/dL (ref <130)
Total CHOL/HDL Ratio: 3.1 (calc) (ref <5.0)
Triglycerides: 78 mg/dL (ref <150)

## 2024-05-18 LAB — TSH: TSH: 2.44 m[IU]/L

## 2024-05-18 LAB — HEMOGLOBIN A1C
Hgb A1c MFr Bld: 6.1 % — ABNORMAL HIGH (ref <5.7)
Mean Plasma Glucose: 128 mg/dL
eAG (mmol/L): 7.1 mmol/L

## 2024-05-18 LAB — VITAMIN D 25 HYDROXY (VIT D DEFICIENCY, FRACTURES): Vit D, 25-Hydroxy: 21 ng/mL — ABNORMAL LOW (ref 30–100)

## 2024-05-18 LAB — VITAMIN B12: Vitamin B-12: 203 pg/mL (ref 200–1100)

## 2024-05-31 ENCOUNTER — Encounter: Payer: Self-pay | Admitting: Family Medicine

## 2024-05-31 ENCOUNTER — Ambulatory Visit (INDEPENDENT_AMBULATORY_CARE_PROVIDER_SITE_OTHER): Admitting: Family Medicine

## 2024-05-31 ENCOUNTER — Other Ambulatory Visit: Payer: Self-pay

## 2024-05-31 VITALS — BP 124/70 | HR 76 | Ht 60.0 in | Wt 280.5 lb

## 2024-05-31 DIAGNOSIS — L649 Androgenic alopecia, unspecified: Secondary | ICD-10-CM | POA: Diagnosis not present

## 2024-05-31 DIAGNOSIS — M62830 Muscle spasm of back: Secondary | ICD-10-CM

## 2024-05-31 DIAGNOSIS — G4733 Obstructive sleep apnea (adult) (pediatric): Secondary | ICD-10-CM | POA: Diagnosis not present

## 2024-05-31 DIAGNOSIS — Z1231 Encounter for screening mammogram for malignant neoplasm of breast: Secondary | ICD-10-CM | POA: Diagnosis not present

## 2024-05-31 DIAGNOSIS — E559 Vitamin D deficiency, unspecified: Secondary | ICD-10-CM | POA: Diagnosis not present

## 2024-05-31 DIAGNOSIS — Z6841 Body Mass Index (BMI) 40.0 and over, adult: Secondary | ICD-10-CM

## 2024-05-31 DIAGNOSIS — Z0000 Encounter for general adult medical examination without abnormal findings: Secondary | ICD-10-CM | POA: Diagnosis not present

## 2024-05-31 DIAGNOSIS — E538 Deficiency of other specified B group vitamins: Secondary | ICD-10-CM | POA: Diagnosis not present

## 2024-05-31 MED ORDER — "SYRINGE/NEEDLE (DISP) 25G X 1"" 1 ML MISC"
0 refills | Status: DC
  Filled 2024-05-31: qty 8, fill #0

## 2024-05-31 MED ORDER — CYCLOBENZAPRINE HCL 5 MG PO TABS
5.0000 mg | ORAL_TABLET | Freq: Two times a day (BID) | ORAL | 5 refills | Status: AC | PRN
  Filled 2024-05-31: qty 60, 20d supply, fill #0

## 2024-05-31 MED ORDER — CYANOCOBALAMIN 1000 MCG/ML IJ SOLN
1000.0000 ug | INTRAMUSCULAR | 0 refills | Status: DC
  Filled 2024-05-31: qty 1, 7d supply, fill #0
  Filled 2024-05-31: qty 3, 21d supply, fill #0

## 2024-05-31 NOTE — Progress Notes (Unsigned)
 Subjective:    Patient ID: Stacey Taylor, female    DOB: 1981-03-01, 43 y.o.   MRN: [id number]  Stacey Taylor is a 43 y.o. female presenting on 05/31/2024 for Annual Exam   HPI  Discussed the use of AI scribe software for clinical note transcription with the patient, who gave verbal consent to proceed.  History of Present Illness    Vitamin D  Insufficiency ***  Dermatology Androgen Alopecia ***Has not started   Reviewed 03/27/24 [facility] ED ***  Pre-Diabetes A1c up to 6.1, prior range 5.7 to 5.9 Meds: None Reports good compliance. Tolerating well w/o side-effects Denies hypoglycemia, polyuria, visual changes, numbness or tingling.   Health Maintenance: ***     05/31/2024    2:38 PM 08/24/2023    3:56 PM 03/14/2022    9:05 AM  Depression screen PHQ 2/9  Decreased Interest 0 0 1  Down, Depressed, Hopeless 0 0 0  PHQ - 2 Score 0 0 1  Altered sleeping 1 0 1  Tired, decreased energy 1 1 1   Change in appetite 1 0 0  Feeling bad or failure about yourself  0 0 0  Trouble concentrating 0 0 0  Moving slowly or fidgety/restless 0 0 0  Suicidal thoughts 0 0 0  PHQ-9 Score 3 1 3   Difficult doing work/chores Not difficult at all Not difficult at all Not difficult at all       05/31/2024    2:38 PM 08/24/2023    3:56 PM 03/14/2022    9:05 AM 05/29/2021    1:42 PM  GAD 7 : Generalized Anxiety Score  Nervous, Anxious, on Edge 3 2 1 2   Control/stop worrying 3 2 2 3   Worry too much - different things 3 2 2 3   Trouble relaxing 3 2 2 2   Restless 2 1 1 1   Easily annoyed or irritable 2 3 3 3   Afraid - awful might happen 1 2 2 1   Total GAD 7 Score 17 14 13 15   Anxiety Difficulty Not difficult at all  Not difficult at all Not difficult at all     Past Medical History:  Diagnosis Date   Abnormal Pap smear of anus    HGSIL   Anemia    Back pain    Cervical dysplasia    CIN III   CHF (congestive heart failure) (HCC)    Endometriosis    Gallbladder  problem    Headache    Heart murmur    Leg edema    Premature delivery    x2   Past Surgical History:  Procedure Laterality Date   ABDOMINAL HYSTERECTOMY     CERVICAL BIOPSY  W/ LOOP ELECTRODE EXCISION  11/2005   CESAREAN SECTION  2008/2015   CESAREAN SECTION WITH BILATERAL TUBAL LIGATION  2015   CHOLECYSTECTOMY  2003   COLPOSCOPY  2005, 2006   COMBINED HYSTEROSCOPY DIAGNOSTIC / D&C  2014   CYSTOSCOPY N/A 03/08/2020   Procedure: CYSTOSCOPY;  Surgeon: Lake Read, MD;  Location: [facility];  Service: Gynecology;  Laterality: N/A;   LAPAROSCOPIC SUPRACERVICAL HYSTERECTOMY N/A 03/08/2020   Procedure: LAPAROSCOPIC SUPRACERVICAL HYSTERECTOMY WITH BILATERAL SALPINGECTOMY;  Surgeon: Lake Read, MD;  Location: [facility];  Service: Gynecology;  Laterality: N/A;   Social History   Socioeconomic History   Marital status: Divorced    Spouse name: Evalene   Number of children: 5   Years of education: Not on file   Highest education level: Not on file  Occupational History   Occupation: CNA  Tobacco Use   Smoking status: Never   Smokeless tobacco: Never  Vaping Use   Vaping status: Never Used  Substance and Sexual Activity   Alcohol use: No    Alcohol/week: 0.0 standard drinks of alcohol   Drug use: No   Sexual activity: Yes    Birth control/protection: Surgical    Comment: Tubal ligation  Other Topics Concern   Not on file  Social History Narrative   Not on file   Social Drivers of Health   Financial Resource Strain: Low Risk  (10/07/2023)   Received from [facility]   Overall Financial Resource Strain (CARDIA)    Difficulty of Paying Living Expenses: Not very hard  Food Insecurity: No Food Insecurity (10/07/2023)   Received from [facility]   Hunger Vital Sign    Within the past 12 months, you worried that your food would run out before you got the money to buy more.: Never true    Within the past 12 months, the food you bought  just didn't last and you didn't have money to get more.: Never true  Transportation Needs: No Transportation Needs (10/07/2023)   Received from [facility] - Transportation    In the past 12 months, has lack of transportation kept you from medical appointments or from getting medications?: No    Lack of Transportation (Non-Medical): No  Physical Activity: Inactive (10/29/2017)   Exercise Vital Sign    Days of Exercise per Week: 0 days    Minutes of Exercise per Session: 0 min  Stress: Stress Concern Present (10/29/2017)   [employer] of Occupational Health - Occupational Stress Questionnaire    Feeling of Stress : Very much  Social Connections: Moderately Integrated (10/29/2017)   Social Connection and Isolation Panel    Frequency of Communication with Friends and Family: More than three times a week    Frequency of Social Gatherings with Friends and Family: More than three times a week    Attends Religious Services: More than 4 times per year    Active Member of [employer] or Organizations: No    Attends Banker Meetings: Never    Marital Status: Married  Catering manager Violence: Not At Risk (10/29/2017)   Humiliation, Afraid, Rape, and Kick questionnaire    Fear of Current or Ex-Partner: No    Emotionally Abused: No    Physically Abused: No    Sexually Abused: No   Family History  Adopted: Yes  Family history unknown: Yes   Current Outpatient Medications on File Prior to Visit  Medication Sig   clobetasol  (TEMOVATE ) 0.05 % external solution Apply 1 Application topically daily. (Patient not taking: Reported on 05/31/2024)   minoxidil  (LONITEN ) 2.5 MG tablet Take 1 tablet (2.5 mg total) by mouth daily. (Patient not taking: Reported on 05/31/2024)   spironolactone  (ALDACTONE ) 25 MG tablet Take 0.5 tablets (12.5 mg total) by mouth daily. (Patient not taking: Reported on 05/31/2024)   valsartan  (DIOVAN ) 40 MG tablet Take 1 tablet (40 mg total)  by mouth daily. (Patient not taking: Reported on 05/31/2024)   [DISCONTINUED] loratadine (CLARITIN) 10 MG tablet Take 10 mg by mouth daily.   No current facility-administered medications on file prior to visit.    Review of Systems Per HPI unless specifically indicated above     Objective:    BP 124/70 (BP Location: Left Arm, Patient Position: Sitting, Cuff Size: Large)  Pulse 76   Ht 5' (1.524 m)   Wt 280 lb 8 oz (127.2 kg)   LMP 01/06/2020   SpO2 97%   BMI 54.78 kg/m   Wt Readings from Last 3 Encounters:  05/31/24 280 lb 8 oz (127.2 kg)  09/11/23 275 lb (124.7 kg)  08/24/23 275 lb (124.7 kg)    Physical Exam  Results for orders placed or performed in visit on 05/13/24  TSH   Collection Time: 05/17/24  9:49 AM  Result Value Ref Range   TSH 2.44 mIU/L  Lipid panel   Collection Time: 05/17/24  9:49 AM  Result Value Ref Range   Cholesterol 159 <200 mg/dL   HDL 51 > OR = 50 mg/dL   Triglycerides 78 <849 mg/dL   LDL Cholesterol (Calc) 91 mg/dL (calc)   Total CHOL/HDL Ratio 3.1 <5.0 (calc)   Non-HDL Cholesterol (Calc) 108 <130 mg/dL (calc)  Hemoglobin J8r   Collection Time: 05/17/24  9:49 AM  Result Value Ref Range   Hgb A1c MFr Bld 6.1 (H) <5.7 %   Mean Plasma Glucose 128 mg/dL   eAG (mmol/L) 7.1 mmol/L  CBC with Differential/Platelet   Collection Time: 05/17/24  9:49 AM  Result Value Ref Range   WBC 8.7 3.8 - 10.8 Thousand/uL   RBC 3.92 3.80 - 5.10 Million/uL   Hemoglobin 11.2 (L) 11.7 - 15.5 g/dL   HCT 65.7 (L) 64.9 - 54.9 %   MCV 87.2 80.0 - 100.0 fL   MCH 28.6 27.0 - 33.0 pg   MCHC 32.7 32.0 - 36.0 g/dL   RDW 86.7 88.9 - 84.9 %   Platelets 399 140 - 400 Thousand/uL   MPV 8.8 7.5 - 12.5 fL   Neutro Abs 5,577 1,500 - 7,800 cells/uL   Absolute Lymphocytes 2,480 850 - 3,900 cells/uL   Absolute Monocytes 426 200 - 950 cells/uL   Eosinophils Absolute 191 15 - 500 cells/uL   Basophils Absolute 26 0 - 200 cells/uL   Neutrophils Relative % 64.1 %   Total  Lymphocyte 28.5 %   Monocytes Relative 4.9 %   Eosinophils Relative 2.2 %   Basophils Relative 0.3 %  Comprehensive metabolic panel with GFR   Collection Time: 05/17/24  9:49 AM  Result Value Ref Range   Glucose, Bld 113 (H) 65 - 99 mg/dL   BUN 9 7 - 25 mg/dL   Creat 9.50 (L) 9.49 - 0.99 mg/dL   eGFR 878 > OR = 60 fO/fpw/8.26f7   BUN/Creatinine Ratio 18 6 - 22 (calc)   Sodium 141 135 - 146 mmol/L   Potassium 3.7 3.5 - 5.3 mmol/L   Chloride 104 98 - 110 mmol/L   CO2 27 20 - 32 mmol/L   Calcium  9.0 8.6 - 10.2 mg/dL   Total Protein 7.0 6.1 - 8.1 g/dL   Albumin 3.7 3.6 - 5.1 g/dL   Globulin 3.3 1.9 - 3.7 g/dL (calc)   AG Ratio 1.1 1.0 - 2.5 (calc)   Total Bilirubin 0.4 0.2 - 1.2 mg/dL   Alkaline phosphatase (APISO) 59 31 - 125 U/L   AST 13 10 - 30 U/L   ALT 17 6 - 29 U/L  Vitamin B12   Collection Time: 05/17/24  9:49 AM  Result Value Ref Range   Vitamin B-12 203 200 - 1,100 pg/mL  VITAMIN D  25 Hydroxy (Vit-D Deficiency, Fractures)   Collection Time: 05/17/24  9:49 AM  Result Value Ref Range   Vit D, 25-Hydroxy 21 (L) 30 -  100 ng/mL      Assessment & Plan:   Problem List Items Addressed This Visit     Morbid obesity with BMI of 50.0-59.9, adult (HCC)   Vitamin B12 deficiency   Relevant Medications   Syringe/Needle, Disp, 25G X 1 1 ML MISC   cyanocobalamin  (VITAMIN B12) 1000 MCG/ML injection   Vitamin D  deficiency   Other Visit Diagnoses       Annual physical exam    -  Primary     Encounter for screening mammogram for malignant neoplasm of breast       Relevant Orders   MM 3D SCREENING MAMMOGRAM BILATERAL BREAST     Androgenic alopecia         Spasm of muscle of lower back       Relevant Medications   cyclobenzaprine  (FLEXERIL ) 5 MG tablet        Updated Health Maintenance information ***- Reviewed recent lab results with patient Encouraged improvement to lifestyle with diet and exercise -*** Goal of weight loss  Assessment and Plan Assessment &  Plan      Orders Placed This Encounter  Procedures   MM 3D SCREENING MAMMOGRAM BILATERAL BREAST    Standing Status:   Future    Expiration Date:   05/31/2025    Reason for Exam (SYMPTOM  OR DIAGNOSIS REQUIRED):   Screening bilateral 3D Mammogram Tomo    Preferred imaging location?:   [facility]    Meds ordered this encounter  Medications   Syringe/Needle, Disp, 25G X 1 1 ML MISC    Sig: Use for B12 injection 1 mL per dose. First 4 weeks, inject 1 dose weekly. Next 4 months injection one dose monthly.    Dispense:  8 each    Refill:  0   cyanocobalamin  (VITAMIN B12) 1000 MCG/ML injection    Sig: Inject 1 mL (1,000 mcg total) into the muscle once a week.    Dispense:  4 mL    Refill:  0    For first 4 weeks, then contact office for monthly order.   cyclobenzaprine  (FLEXERIL ) 5 MG tablet    Sig: Take 1-2 tablets (5-10 mg total) by mouth 2 (two) times daily as needed for muscle spasms.    Dispense:  60 tablet    Refill:  5     Follow up plan: Return if symptoms worsen or fail to improve.  Marsa Officer, DO [facility] 05/31/2024, 2:56 PM

## 2024-05-31 NOTE — Patient Instructions (Addendum)
 Thank you for coming to the office today.  For Mammogram screening for breast cancer   Call the Imaging Center below anytime to schedule your own appointment now that order has been placed.  [facility] at [facility] [address] [place], [place] [address] Phone: [phone]  ---------------  Check coverage.  Zepbound TO Check Cost & Coverage of Zepbound Please contact Research officer, trade union (manufacturer for Zepbound) 1-800-LillyRx 272-087-4502) - Live agent to discuss cost and coverage.   [employer]  Verified [email or web address] [email or web address]-cost-and-coverage.[email or web address]   Please schedule a Follow-up Appointment to: Return if symptoms worsen or fail to improve.  If you have any other questions or concerns, please feel free to call the office or send a message through MyChart. You may also schedule an earlier appointment if necessary.  Additionally, you may be receiving a survey about your experience at our office within a few days to 1 week by e-mail or mail. We value your feedback.  Marsa Officer, DO [facility], [place]

## 2024-06-02 ENCOUNTER — Other Ambulatory Visit: Payer: Self-pay

## 2024-07-01 ENCOUNTER — Other Ambulatory Visit: Payer: Self-pay

## 2024-07-01 DIAGNOSIS — Z6841 Body Mass Index (BMI) 40.0 and over, adult: Secondary | ICD-10-CM | POA: Diagnosis not present

## 2024-07-01 DIAGNOSIS — G4733 Obstructive sleep apnea (adult) (pediatric): Secondary | ICD-10-CM | POA: Diagnosis not present

## 2024-07-01 DIAGNOSIS — M170 Bilateral primary osteoarthritis of knee: Secondary | ICD-10-CM | POA: Diagnosis not present

## 2024-07-01 DIAGNOSIS — M1712 Unilateral primary osteoarthritis, left knee: Secondary | ICD-10-CM | POA: Diagnosis not present

## 2024-07-01 DIAGNOSIS — M25462 Effusion, left knee: Secondary | ICD-10-CM | POA: Diagnosis not present

## 2024-07-01 MED ORDER — CELECOXIB 200 MG PO CAPS
200.0000 mg | ORAL_CAPSULE | Freq: Two times a day (BID) | ORAL | 0 refills | Status: DC
  Filled 2024-07-01 – 2024-07-15 (×2): qty 60, 30d supply, fill #0

## 2024-07-06 DIAGNOSIS — M170 Bilateral primary osteoarthritis of knee: Secondary | ICD-10-CM | POA: Diagnosis not present

## 2024-07-06 DIAGNOSIS — M25562 Pain in left knee: Secondary | ICD-10-CM | POA: Diagnosis not present

## 2024-07-11 ENCOUNTER — Other Ambulatory Visit: Payer: Self-pay

## 2024-07-15 ENCOUNTER — Other Ambulatory Visit: Payer: Self-pay

## 2024-07-15 DIAGNOSIS — G4733 Obstructive sleep apnea (adult) (pediatric): Secondary | ICD-10-CM | POA: Diagnosis not present

## 2024-09-01 ENCOUNTER — Other Ambulatory Visit: Payer: Self-pay

## 2024-09-01 MED ORDER — FLUBLOK 0.5 ML IM SOSY
0.5000 mL | PREFILLED_SYRINGE | Freq: Once | INTRAMUSCULAR | 0 refills | Status: AC
  Filled 2024-09-01: qty 0.5, 1d supply, fill #0

## 2024-10-14 DIAGNOSIS — G4733 Obstructive sleep apnea (adult) (pediatric): Secondary | ICD-10-CM | POA: Diagnosis not present

## 2024-10-17 ENCOUNTER — Telehealth: Payer: Self-pay

## 2024-10-17 ENCOUNTER — Other Ambulatory Visit: Payer: Self-pay

## 2024-10-17 NOTE — Telephone Encounter (Signed)
 Copied from CRM #[id number]. Topic: Referral - Request for Referral >> Oct 17, 2024  2:56 PM Winona R wrote: Did the patient discuss referral with their provider in the last year? No (If No - schedule appointment) (If Yes - send message)  Appointment offered? No  Type of order/referral and detailed reason for visit: Referral for cone bareatric surgery   Preference of office, provider, location: Mitzie Alan Freund, MD- [address] [place], [place] [address] [phone]  If referral order, have you been seen by this specialty before? No (If Yes, this issue or another issue? When? Where?  Can we respond through MyChart? Yes

## 2024-10-17 NOTE — Telephone Encounter (Signed)
 I cannot do this referral for a few reasons.  I am not familiar with [place] having Bariatric Surgery location for referral. I think the website still exists for the surgery center in [place], but these doctors listed are not practicing there for bariatric surgery.  Most of these doctors also work through [employer] in [place], but they were bought out by [employer]. And I don't see Dr Signe still listed.  Also there is a phone call from 08/26/24 that is from [facility] verifying her Bariatric Surgery Benefits and it says she is NOT covered, so it is all self pay out of pocket.  I have too many questions about this request. It sounds like she needs apt to discuss or she should leave us  more details or answer some questions about what the situation is so we can figure out what to do next  Marsa Officer, DO [facility] 10/17/2024, 5:48 PM

## 2024-11-01 ENCOUNTER — Encounter: Payer: Self-pay | Admitting: Family Medicine

## 2024-11-01 ENCOUNTER — Ambulatory Visit (INDEPENDENT_AMBULATORY_CARE_PROVIDER_SITE_OTHER): Admitting: Family Medicine

## 2024-11-01 VITALS — BP 120/72 | HR 79 | Ht 60.0 in | Wt 285.0 lb

## 2024-11-01 DIAGNOSIS — E559 Vitamin D deficiency, unspecified: Secondary | ICD-10-CM

## 2024-11-01 DIAGNOSIS — E282 Polycystic ovarian syndrome: Secondary | ICD-10-CM

## 2024-11-01 DIAGNOSIS — R7303 Prediabetes: Secondary | ICD-10-CM

## 2024-11-01 DIAGNOSIS — E785 Hyperlipidemia, unspecified: Secondary | ICD-10-CM | POA: Diagnosis not present

## 2024-11-01 DIAGNOSIS — E538 Deficiency of other specified B group vitamins: Secondary | ICD-10-CM

## 2024-11-01 DIAGNOSIS — G8929 Other chronic pain: Secondary | ICD-10-CM

## 2024-11-01 DIAGNOSIS — M5442 Lumbago with sciatica, left side: Secondary | ICD-10-CM | POA: Diagnosis not present

## 2024-11-01 DIAGNOSIS — Z6841 Body Mass Index (BMI) 40.0 and over, adult: Secondary | ICD-10-CM

## 2024-11-01 DIAGNOSIS — I10 Essential (primary) hypertension: Secondary | ICD-10-CM | POA: Diagnosis not present

## 2024-11-01 DIAGNOSIS — R600 Localized edema: Secondary | ICD-10-CM | POA: Diagnosis not present

## 2024-11-01 DIAGNOSIS — M170 Bilateral primary osteoarthritis of knee: Secondary | ICD-10-CM

## 2024-11-01 DIAGNOSIS — G4733 Obstructive sleep apnea (adult) (pediatric): Secondary | ICD-10-CM

## 2024-11-01 NOTE — Patient Instructions (Addendum)
 Thank you for coming to the office today.   10/01/2015 10/11/2015 11/19/2015 11/23/2015 11/29/2015 12/25/2015  Vitals with BMI        Weight  228 lbs   246 lbs  237 lbs 3 oz      03/20/2016 08/29/2016 09/01/2016 09/08/2016 09/22/2016 06/30/2017 07/01/2017  Vitals with BMI         Weight  239 lbs  237 lbs  238 lbs  238 lbs  240 lbs    Weight   240 lbs         07/09/2017 07/30/2017 08/25/2017 10/29/2017 11/05/2017 12/03/2017  Vitals with BMI        Weight 240 lbs  240 lbs 10 oz   245 lbs  248 lbs  246 lbs     12/10/2017 12/24/2017 12/31/2017 01/11/2018 03/11/2018 05/04/2018  Vitals with BMI        Weight 244 lbs  241 lbs  242 lbs  242 lbs 10 oz  239 lbs  240 lbs 13 oz     06/18/2018 07/15/2018 09/05/2018 11/18/2018 01/13/2019 07/12/2019  Vitals with BMI        Weight 240 lbs  244 lbs  244 lbs  250 lbs  252 lbs 13 oz  275 lbs 10 oz     08/15/2019 10/26/2019 11/01/2019 02/24/2020 02/27/2020 03/08/2020 03/09/2020  Vitals with BMI         Weight 276 lbs  271 lbs  273 lbs  283 lbs  283 lbs  280 lbs      03/13/2020 03/15/2020 03/28/2020 04/20/2020 05/29/2020 06/08/2020  Vitals with BMI        Weight 283 lbs  283 lbs  282 lbs 10 oz  281 lbs  285 lbs 10 oz !  283 lbs 10 oz     07/10/2020 09/04/2020 11/07/2020 11/27/2020 01/04/2021 01/23/2021  Vitals with BMI        Weight 272 lbs  275 lbs  269 lbs 6 oz  271 lbs 3 oz  271 lbs      04/15/2021 05/24/2021 05/29/2021 08/27/2021 09/02/2021 09/03/2021  Vitals with BMI        Weight  280 lbs  271 lbs 6 oz   271 lbs  273 lbs 3 oz     11/11/2021 02/24/2022 03/12/2022 03/14/2022 04/27/2022 08/22/2022  Vitals with BMI        Weight 272 lbs    271 lbs 6 oz   270 lbs     08/29/2022 10/22/2022 01/12/2023 01/20/2023 02/10/2023 06/14/2023  Vitals with BMI        Weight 267 lbs  273 lbs 3 oz   273 lbs  272 lbs      08/24/2023 08/26/2023 09/11/2023 12/03/2023 05/31/2024 11/01/2024  Vitals with BMI        Weight 275 lbs   275 lbs   280 lbs 8 oz  285 lbs      11/2020 started Wegovy  samples but unable to continue due  to coverage / cost 05/2021 trial on Saxenda samples 3+ weeks lack of coverage 09/2021 restarted Wegovy  at higher dose, insurance coverage changed after 11/03/21  We will fax the letter from our office and then you can pick it up and add it to your scan list to email them.  Let me know if other questions on your history forms.  Referral to [facility] ([facility]) [address] [place], [place] [address]   Referral to Endocrine  [facility] Address:  [address] [place], [place] [address] Phone: [phone]  Please schedule a Follow-up Appointment to: Return in about 6 months (around 05/02/2025) for 6 month follow-up Annual Physical.  If you have any other questions or concerns, please feel free to call the office or send a message through MyChart. You may also schedule an earlier appointment if necessary.  Additionally, you may be receiving a survey about your experience at our office within a few days to 1 week by e-mail or mail. We value your feedback.  Marsa Officer, DO [facility], [place]

## 2024-11-01 NOTE — Progress Notes (Signed)
 "  Subjective:    Patient ID: Stacey Taylor, female    DOB: [birth year], 43 y.o.   MRN: [id number]  Stacey Taylor is a 43 y.o. female presenting on 11/01/2024 for Weight Loss   HPI  Discussed the use of AI scribe software for clinical note transcription with the patient, who gave verbal consent to proceed.  History of Present Illness   Stacey Taylor is a 43 year old female who presents for a referral for weight management.  Morbid Obesity BMI >55 Obesity and weight management - Significant weight gain since 2020 - BMI was 52 in January 2022 - Previous attempts at toys '[employer] us, including a referral in 2020 that was unsuccessful due to insurance issues - Family history of obesity; sister underwent gastric bypass and used semaglutide   Pharmacologic interventions for weight loss - Started Wegovy  injections in January 2022; discontinued due to coverage and cost issues - Tried Saxenda in July 2022 for three weeks without significant results; discontinued due to coverage issues - Restarted Wegovy  at a higher dose in November 2022; unable to sustain due to insurance changes - No use of phentermine or similar medications due to concerns about heart and blood pressure side effects - Past use of B12 injections, which improved energy levels  Musculoskeletal symptoms - Knee pain and joint pain associated with obesity - Chronic low back pain with episodic flares, history sciatica  Hypertension Controlled on current therapy Spironolactone   Prediabetes Due for repeat A1c, last 6.1 not on therapy  OSA - Sleep apnea associated with obesity On CPAP  Gynecologic history - History of polycystic ovary syndrome (PCOS) and endometriosis - Status post hysterectomy - Considering endocrinology consultation for PCOS and endometriosis - Not currently under care of an OB GYN     Musculoskeletal symptoms - Uses muscle relaxants for musculoskeletal symptoms - Prefers Flexeril  over  methocarbamol , which was prescribed during a previous visit to [facility]        11/01/2024    9:50 AM 05/31/2024    2:38 PM 08/24/2023    3:56 PM  Depression screen PHQ 2/9  Decreased Interest 0 0 0  Down, Depressed, Hopeless 0 0 0  PHQ - 2 Score 0 0 0  Altered sleeping  1 0  Tired, decreased energy  1 1  Change in appetite  1 0  Feeling bad or failure about yourself   0 0  Trouble concentrating  0 0  Moving slowly or fidgety/restless  0 0  Suicidal thoughts  0 0  PHQ-9 Score  3  1   Difficult doing work/chores  Not difficult at all Not difficult at all     Data saved with a previous flowsheet row definition       11/01/2024    9:51 AM 05/31/2024    2:38 PM 08/24/2023    3:56 PM 03/14/2022    9:05 AM  GAD 7 : Generalized Anxiety Score  Nervous, Anxious, on Edge 0 3 2 1   Control/stop worrying 0 3 2 2   Worry too much - different things 0 3 2 2   Trouble relaxing 0 3 2 2   Restless 0 2 1 1   Easily annoyed or irritable 0 2 3 3   Afraid - awful might happen 0 1 2 2   Total GAD 7 Score 0 17 14 13   Anxiety Difficulty  Not difficult at all  Not difficult at all    Social History[1]  Review of Systems Per HPI unless specifically indicated  above     Objective:    BP 120/72 (BP Location: Left Arm, Patient Position: Sitting, Cuff Size: Large)   Pulse 79   Ht 5' (1.524 m)   Wt 285 lb (129.3 kg)   LMP 01/06/2020   SpO2 97%   BMI 55.66 kg/m   Wt Readings from Last 3 Encounters:  11/01/24 285 lb (129.3 kg)  05/31/24 280 lb 8 oz (127.2 kg)  09/11/23 275 lb (124.7 kg)    Physical Exam Vitals and nursing note reviewed.  Constitutional:      General: She is not in acute distress.    Appearance: She is well-developed. She is obese. She is not diaphoretic.     Comments: Well-appearing, comfortable, cooperative  HENT:     Head: Normocephalic and atraumatic.  Eyes:     General:        Right eye: No discharge.        Left eye: No discharge.      Conjunctiva/sclera: Conjunctivae normal.  Neck:     Thyroid : No thyromegaly.  Cardiovascular:     Rate and Rhythm: Normal rate and regular rhythm.     Heart sounds: Normal heart sounds. No murmur heard. Pulmonary:     Effort: Pulmonary effort is normal. No respiratory distress.     Breath sounds: Normal breath sounds. No wheezing or rales.  Musculoskeletal:        General: Normal range of motion.     Cervical back: Normal range of motion and neck supple.  Lymphadenopathy:     Cervical: No cervical adenopathy.  Skin:    General: Skin is warm and dry.     Findings: No erythema or rash.  Neurological:     Mental Status: She is alert and oriented to person, place, and time.  Psychiatric:        Behavior: Behavior normal.     Comments: Well groomed, good eye contact, normal speech and thoughts     Results for orders placed or performed in visit on 11/01/24  Comprehensive metabolic panel with GFR   Collection Time: 11/01/24 10:44 AM  Result Value Ref Range   Glucose, Bld 114 (H) 65 - 99 mg/dL   BUN 7 7 - 25 mg/dL   Creat 9.48 9.49 - 9.00 mg/dL   eGFR 880 > OR = 60 fO/fpw/8.26f7   BUN/Creatinine Ratio SEE NOTE: 6 - 22 (calc)   Sodium 139 135 - 146 mmol/L   Potassium 3.6 3.5 - 5.3 mmol/L   Chloride 104 98 - 110 mmol/L   CO2 26 20 - 32 mmol/L   Calcium  8.8 8.6 - 10.2 mg/dL   Total Protein 7.1 6.1 - 8.1 g/dL   Albumin 3.8 3.6 - 5.1 g/dL   Globulin 3.3 1.9 - 3.7 g/dL (calc)   AG Ratio 1.2 1.0 - 2.5 (calc)   Total Bilirubin 0.4 0.2 - 1.2 mg/dL   Alkaline phosphatase (APISO) 67 31 - 125 U/L   AST 15 10 - 30 U/L   ALT 17 6 - 29 U/L  CBC with Differential/Platelet   Collection Time: 11/01/24 10:44 AM  Result Value Ref Range   WBC 8.3 3.8 - 10.8 Thousand/uL   RBC 4.17 3.80 - 5.10 Million/uL   Hemoglobin 11.6 (L) 11.7 - 15.5 g/dL   HCT 64.1 (L) 64.0 - 53.9 %   MCV 85.9 81.4 - 101.7 fL   MCH 27.8 27.0 - 33.0 pg   MCHC 32.4 31.6 - 35.4 g/dL   RDW 86.2 88.9 -  15.0 %   Platelets  422 (H) 140 - 400 Thousand/uL   MPV 9.2 7.5 - 12.5 fL   Neutro Abs 5,536 1,500 - 7,800 cells/uL   Absolute Lymphocytes 2,050 850 - 3,900 cells/uL   Absolute Monocytes 457 200 - 950 cells/uL   Eosinophils Absolute 208 15 - 500 cells/uL   Basophils Absolute 50 0 - 200 cells/uL   Neutrophils Relative % 66.7 %   Total Lymphocyte 24.7 %   Monocytes Relative 5.5 %   Eosinophils Relative 2.5 %   Basophils Relative 0.6 %      Assessment & Plan:   Problem List Items Addressed This Visit     Bilateral lower extremity edema   Bilateral primary osteoarthritis of knee   Chronic low back pain with left-sided sciatica   Essential hypertension   Relevant Medications   spironolactone  (ALDACTONE ) 25 MG tablet   Other Relevant Orders   Comprehensive metabolic panel with GFR (Completed)   CBC with Differential/Platelet (Completed)   Morbid obesity with BMI of 50.0-59.9, adult (HCC) - Primary   Relevant Orders   Comprehensive metabolic panel with GFR (Completed)   CBC with Differential/Platelet (Completed)   Ambulatory referral to Endocrinology   Amb Referral to Bariatric Surgery   OSA (obstructive sleep apnea)   Pre-diabetes   Relevant Orders   Hemoglobin A1c   Vitamin B12 deficiency   Relevant Orders   Vitamin B12   Vitamin D  deficiency   Relevant Orders   VITAMIN D  25 Hydroxy (Vit-D Deficiency, Fractures)   Other Visit Diagnoses       Dyslipidemia         PCOS (polycystic ovarian syndrome)       Relevant Orders   Ambulatory referral to Endocrinology        Morbid obesity BMI >55 Chronic obesity with difficulty losing weight. Long history of weight loss attempts with lifestyle modification structured diet and exercise programs, and medication attempts however limited due to lack of coverage on medications only able to trial samples for short period of time.  Weight trend over past 5 years. 01/13/19 - 252 lbs 02/27/20 - 283 lbs 05/29/21 - 271 lbs 08/29/22 - 267 lbs 08/24/23 -  275 lbs 11/01/24 - 285 lbs  Medication attempts 11/2020 started Wegovy  samples but unable to continue due to coverage / cost 05/2021 trial on Saxenda samples 3+ weeks lack of coverage 09/2021 restarted Wegovy  at higher dose, insurance coverage changed after 11/03/21 2023 - current: no longer on medication management  Previous weight loss medications unsuccessful due to insurance.  Limited options now with meds as no weight loss injectable therapy is covered under insurance and she is not a candidate for Phentermine containing medication due to cardiovascular hypertension risk. Considered oral Contrave but limited by cost still and coverage. - Future consideration for oral wegovy  if available and affordable  Patient requests re-attempt bariatrics referral - Completed referral paperwork for bariatrics at [facility] in [place].  Medical complications due to obesity include:  Essential hypertension On Spironolactone   Dyslipidemia Elevated LDL in history, recently has shown improvement.  Obstructive sleep apnea Considered a complication of obesity. On CPAP therapy  Pre-diabetes Last A1c 6.1  Vitamin D  deficiency Noted.  Vitamin B12 deficiency Managed with injections. Reports improved energy levels. - Checked vitamin B12 levels.  Polycystic ovary syndrome Previously diagnosed with PCOS, concern with progressive weight gain No prior testosterone  lab recently. However today it is after 10am, will not be able to collect testosterone  and may  not be covered, will defer additional specialty lab work up to Endocrine at this time. - Referred to endocrinology for PCOS diagnosis and management.     Fax letter to bariatrics and call her to pick up letter and she can scan and send email too    Orders Placed This Encounter  Procedures   Comprehensive metabolic panel with GFR   Hemoglobin A1c   Vitamin B12   VITAMIN D  25 Hydroxy (Vit-D Deficiency, Fractures)   CBC with  Differential/Platelet   Ambulatory referral to Endocrinology    Referral Priority:   Routine    Referral Type:   Consultation    Referral Reason:   Specialty Services Required    Number of Visits Requested:   1   Amb Referral to Bariatric Surgery    Referral Priority:   Routine    Referral Type:   Consultation    Number of Visits Requested:   1    No orders of the defined types were placed in this encounter.   Follow up plan: Return in about 6 months (around 05/02/2025) for 6 month follow-up Annual Physical.    Marsa Officer, DO [facility] 11/01/2024, 10:12 AM     [1]  Social History Tobacco Use   Smoking status: Never   Smokeless tobacco: Never  Vaping Use   Vaping status: Never Used  Substance Use Topics   Alcohol use: No    Alcohol/week: 0.0 standard drinks of alcohol   Drug use: No   "

## 2024-11-02 LAB — COMPREHENSIVE METABOLIC PANEL WITH GFR
AG Ratio: 1.2 (calc) (ref 1.0–2.5)
ALT: 17 U/L (ref 6–29)
AST: 15 U/L (ref 10–30)
Albumin: 3.8 g/dL (ref 3.6–5.1)
Alkaline phosphatase (APISO): 67 U/L (ref 31–125)
BUN: 7 mg/dL (ref 7–25)
CO2: 26 mmol/L (ref 20–32)
Calcium: 8.8 mg/dL (ref 8.6–10.2)
Chloride: 104 mmol/L (ref 98–110)
Creat: 0.51 mg/dL (ref 0.50–0.99)
Globulin: 3.3 g/dL (ref 1.9–3.7)
Glucose, Bld: 114 mg/dL — ABNORMAL HIGH (ref 65–99)
Potassium: 3.6 mmol/L (ref 3.5–5.3)
Sodium: 139 mmol/L (ref 135–146)
Total Bilirubin: 0.4 mg/dL (ref 0.2–1.2)
Total Protein: 7.1 g/dL (ref 6.1–8.1)
eGFR: 119 mL/min/1.73m2

## 2024-11-02 LAB — CBC WITH DIFFERENTIAL/PLATELET
Absolute Lymphocytes: 2050 {cells}/uL (ref 850–3900)
Absolute Monocytes: 457 {cells}/uL (ref 200–950)
Basophils Absolute: 50 {cells}/uL (ref 0–200)
Basophils Relative: 0.6 %
Eosinophils Absolute: 208 {cells}/uL (ref 15–500)
Eosinophils Relative: 2.5 %
HCT: 35.8 % — ABNORMAL LOW (ref 35.9–46.0)
Hemoglobin: 11.6 g/dL — ABNORMAL LOW (ref 11.7–15.5)
MCH: 27.8 pg (ref 27.0–33.0)
MCHC: 32.4 g/dL (ref 31.6–35.4)
MCV: 85.9 fL (ref 81.4–101.7)
MPV: 9.2 fL (ref 7.5–12.5)
Monocytes Relative: 5.5 %
Neutro Abs: 5536 {cells}/uL (ref 1500–7800)
Neutrophils Relative %: 66.7 %
Platelets: 422 Thousand/uL — ABNORMAL HIGH (ref 140–400)
RBC: 4.17 Million/uL (ref 3.80–5.10)
RDW: 13.7 % (ref 11.0–15.0)
Total Lymphocyte: 24.7 %
WBC: 8.3 Thousand/uL (ref 3.8–10.8)

## 2024-11-02 LAB — HEMOGLOBIN A1C
Hgb A1c MFr Bld: 6.1 % — ABNORMAL HIGH
Mean Plasma Glucose: 128 mg/dL
eAG (mmol/L): 7.1 mmol/L

## 2024-11-02 LAB — VITAMIN D 25 HYDROXY (VIT D DEFICIENCY, FRACTURES): Vit D, 25-Hydroxy: 27 ng/mL — ABNORMAL LOW (ref 30–100)

## 2024-11-02 LAB — VITAMIN B12: Vitamin B-12: 172 pg/mL — ABNORMAL LOW (ref 200–1100)

## 2024-11-08 ENCOUNTER — Encounter: Payer: Self-pay | Admitting: Family Medicine

## 2024-11-08 DIAGNOSIS — E282 Polycystic ovarian syndrome: Secondary | ICD-10-CM | POA: Insufficient documentation

## 2024-11-08 DIAGNOSIS — E785 Hyperlipidemia, unspecified: Secondary | ICD-10-CM | POA: Insufficient documentation

## 2024-11-09 ENCOUNTER — Ambulatory Visit: Payer: Self-pay | Admitting: Family Medicine
